# Patient Record
Sex: Female | Born: 1945
Health system: Southern US, Community
[De-identification: ages and names within clinical notes are randomized; demographics above are authoritative.]

## PROBLEM LIST (undated history)

## (undated) DIAGNOSIS — I10 Essential (primary) hypertension: Secondary | ICD-10-CM

## (undated) DIAGNOSIS — F03A0 Unspecified dementia, mild, without behavioral disturbance, psychotic disturbance, mood disturbance, and anxiety: Secondary | ICD-10-CM

## (undated) DIAGNOSIS — F0390 Unspecified dementia without behavioral disturbance: Secondary | ICD-10-CM

## (undated) DIAGNOSIS — T8859XA Other complications of anesthesia, initial encounter: Secondary | ICD-10-CM

## (undated) DIAGNOSIS — G4700 Insomnia, unspecified: Secondary | ICD-10-CM

## (undated) DIAGNOSIS — C801 Malignant (primary) neoplasm, unspecified: Secondary | ICD-10-CM

## (undated) DIAGNOSIS — T4145XA Adverse effect of unspecified anesthetic, initial encounter: Secondary | ICD-10-CM

## (undated) DIAGNOSIS — F329 Major depressive disorder, single episode, unspecified: Secondary | ICD-10-CM

## (undated) DIAGNOSIS — G43909 Migraine, unspecified, not intractable, without status migrainosus: Secondary | ICD-10-CM

## (undated) DIAGNOSIS — K2980 Duodenitis without bleeding: Secondary | ICD-10-CM

## (undated) DIAGNOSIS — K219 Gastro-esophageal reflux disease without esophagitis: Secondary | ICD-10-CM

## (undated) DIAGNOSIS — F32A Depression, unspecified: Secondary | ICD-10-CM

## (undated) HISTORY — DX: Insomnia, unspecified: G47.00

## (undated) HISTORY — DX: Depression, unspecified: F32.A

## (undated) HISTORY — DX: Major depressive disorder, single episode, unspecified: F32.9

## (undated) HISTORY — PX: ABDOMINAL HYSTERECTOMY: SHX81

## (undated) HISTORY — PX: SHOULDER SURGERY: SHX246

## (undated) HISTORY — PX: BACK SURGERY: SHX140

---

## 2011-02-21 HISTORY — PX: BRAIN SURGERY: SHX531

## 2017-05-02 ENCOUNTER — Encounter: Payer: Self-pay | Admitting: Cardiology

## 2017-06-14 ENCOUNTER — Ambulatory Visit (HOSPITAL_COMMUNITY)
Admission: EM | Admit: 2017-06-14 | Discharge: 2017-06-14 | Disposition: A | Discharge status: Home / Self Care | Payer: Medicare PPO | Source: Home / Self Care

## 2017-06-14 ENCOUNTER — Emergency Department (HOSPITAL_COMMUNITY)
Admission: EM | Admit: 2017-06-14 | Discharge: 2017-06-14 | Disposition: A | Discharge status: Home / Self Care | Payer: Medicare PPO | Attending: Emergency Medicine | Admitting: Emergency Medicine

## 2017-06-14 ENCOUNTER — Emergency Department (HOSPITAL_COMMUNITY): Payer: Medicare PPO

## 2017-06-14 ENCOUNTER — Other Ambulatory Visit: Payer: Self-pay

## 2017-06-14 ENCOUNTER — Encounter (HOSPITAL_COMMUNITY): Payer: Self-pay | Admitting: Emergency Medicine

## 2017-06-14 DIAGNOSIS — R1110 Vomiting, unspecified: Secondary | ICD-10-CM | POA: Diagnosis present

## 2017-06-14 DIAGNOSIS — K2980 Duodenitis without bleeding: Secondary | ICD-10-CM | POA: Insufficient documentation

## 2017-06-14 DIAGNOSIS — Z85828 Personal history of other malignant neoplasm of skin: Secondary | ICD-10-CM | POA: Insufficient documentation

## 2017-06-14 DIAGNOSIS — Z87891 Personal history of nicotine dependence: Secondary | ICD-10-CM | POA: Insufficient documentation

## 2017-06-14 HISTORY — DX: Malignant (primary) neoplasm, unspecified: C80.1

## 2017-06-14 LAB — COMPREHENSIVE METABOLIC PANEL
ALK PHOS: 50 U/L (ref 38–126)
ALT: 14 U/L (ref 14–54)
ANION GAP: 15 (ref 5–15)
AST: 18 U/L (ref 15–41)
Albumin: 3.7 g/dL (ref 3.5–5.0)
BILIRUBIN TOTAL: 0.9 mg/dL (ref 0.3–1.2)
BUN: 13 mg/dL (ref 6–20)
CALCIUM: 9.7 mg/dL (ref 8.9–10.3)
CO2: 25 mmol/L (ref 22–32)
CREATININE: 1.12 mg/dL — AB (ref 0.44–1.00)
Chloride: 100 mmol/L — ABNORMAL LOW (ref 101–111)
GFR, EST AFRICAN AMERICAN: 55 mL/min — AB (ref >60)
GFR, EST NON AFRICAN AMERICAN: 48 mL/min — AB (ref >60)
Glucose, Bld: 97 mg/dL (ref 65–99)
Potassium: 3.1 mmol/L — ABNORMAL LOW (ref 3.5–5.1)
SODIUM: 140 mmol/L (ref 135–145)
TOTAL PROTEIN: 6.5 g/dL (ref 6.5–8.1)

## 2017-06-14 LAB — URINALYSIS, ROUTINE W REFLEX MICROSCOPIC
Bilirubin Urine: NEGATIVE
Glucose, UA: NEGATIVE mg/dL
HGB URINE DIPSTICK: NEGATIVE
Ketones, ur: 20 mg/dL — AB
Leukocytes, UA: NEGATIVE
NITRITE: NEGATIVE
PROTEIN: NEGATIVE mg/dL
SPECIFIC GRAVITY, URINE: 1.029 (ref 1.005–1.030)
pH: 9 — ABNORMAL HIGH (ref 5.0–8.0)

## 2017-06-14 LAB — CBC
HCT: 37.3 % (ref 36.0–46.0)
Hemoglobin: 11.5 g/dL — ABNORMAL LOW (ref 12.0–15.0)
MCH: 23.6 pg — AB (ref 26.0–34.0)
MCHC: 30.8 g/dL (ref 30.0–36.0)
MCV: 76.4 fL — ABNORMAL LOW (ref 78.0–100.0)
PLATELETS: 546 10*3/uL — AB (ref 150–400)
RBC: 4.88 MIL/uL (ref 3.87–5.11)
RDW: 17.3 % — ABNORMAL HIGH (ref 11.5–15.5)
WBC: 16.6 10*3/uL — AB (ref 4.0–10.5)

## 2017-06-14 LAB — I-STAT CG4 LACTIC ACID, ED
Lactic Acid, Venous: 1.92 mmol/L — ABNORMAL HIGH (ref 0.5–1.9)
Lactic Acid, Venous: 1.9 mmol/L (ref 0.5–1.9)

## 2017-06-14 LAB — LIPASE, BLOOD: Lipase: 54 U/L — ABNORMAL HIGH (ref 11–51)

## 2017-06-14 LAB — I-STAT TROPONIN, ED: TROPONIN I, POC: 0 ng/mL (ref 0.00–0.08)

## 2017-06-14 MED ORDER — GI COCKTAIL ~~LOC~~
30.0000 mL | Freq: Once | ORAL | Status: AC
  Administered 2017-06-14: 30 mL via ORAL
  Filled 2017-06-14: qty 30

## 2017-06-14 MED ORDER — IOPAMIDOL (ISOVUE-300) INJECTION 61%
INTRAVENOUS | Status: AC
  Filled 2017-06-14: qty 100

## 2017-06-14 MED ORDER — CLARITHROMYCIN 500 MG PO TABS
500.0000 mg | ORAL_TABLET | Freq: Two times a day (BID) | ORAL | Status: DC
  Administered 2017-06-14: 500 mg via ORAL
  Filled 2017-06-14: qty 1

## 2017-06-14 MED ORDER — RANITIDINE HCL 150 MG/10ML PO SYRP
150.0000 mg | ORAL_SOLUTION | Freq: Once | ORAL | Status: AC
  Administered 2017-06-14: 150 mg via ORAL
  Filled 2017-06-14: qty 10

## 2017-06-14 MED ORDER — IOPAMIDOL (ISOVUE-300) INJECTION 61%
100.0000 mL | Freq: Once | INTRAVENOUS | Status: AC | PRN
  Administered 2017-06-14: 100 mL via INTRAVENOUS

## 2017-06-14 MED ORDER — PANTOPRAZOLE SODIUM 20 MG PO TBEC: 20.0000 mg | DELAYED_RELEASE_TABLET | Freq: Two times a day (BID) | ORAL | 0 refills | Status: DC

## 2017-06-14 MED ORDER — AMOXICILLIN 250 MG/5ML PO SUSR
500.0000 mg | Freq: Once | ORAL | Status: AC
  Administered 2017-06-14: 500 mg via ORAL
  Filled 2017-06-14 (×2): qty 10

## 2017-06-14 MED ORDER — FENTANYL CITRATE (PF) 100 MCG/2ML IJ SOLN
50.0000 ug | Freq: Once | INTRAMUSCULAR | Status: AC
  Administered 2017-06-14: 50 ug via INTRAVENOUS
  Filled 2017-06-14: qty 2

## 2017-06-14 MED ORDER — ONDANSETRON HCL 4 MG/2ML IJ SOLN
4.0000 mg | Freq: Once | INTRAMUSCULAR | Status: AC
  Administered 2017-06-14: 4 mg via INTRAVENOUS
  Filled 2017-06-14: qty 2

## 2017-06-14 MED ORDER — CLARITHROMYCIN 500 MG PO TABS: 500.0000 mg | ORAL_TABLET | Freq: Two times a day (BID) | ORAL | 0 refills | Status: DC

## 2017-06-14 MED ORDER — ONDANSETRON 4 MG PO TBDP
4.0000 mg | ORAL_TABLET | Freq: Once | ORAL | Status: AC | PRN
  Administered 2017-06-14: 4 mg via ORAL
  Filled 2017-06-14: qty 1

## 2017-06-14 MED ORDER — AMOXICILLIN 500 MG PO CAPS: 1000.0000 mg | ORAL_CAPSULE | Freq: Two times a day (BID) | ORAL | 0 refills | Status: DC

## 2017-06-14 MED ORDER — PANTOPRAZOLE SODIUM 40 MG PO PACK
40.0000 mg | PACK | Freq: Every day | ORAL | Status: DC
  Filled 2017-06-14 (×2): qty 20

## 2017-06-14 MED ORDER — ONDANSETRON HCL 4 MG PO TABS: 4.0000 mg | ORAL_TABLET | Freq: Three times a day (TID) | ORAL | 0 refills | Status: DC | PRN

## 2017-06-14 MED ORDER — LACTATED RINGERS IV BOLUS
1000.0000 mL | Freq: Once | INTRAVENOUS | Status: AC
  Administered 2017-06-14: 1000 mL via INTRAVENOUS

## 2017-06-14 NOTE — ED Notes (Signed)
Patient transported to CT 

## 2017-06-14 NOTE — ED Provider Notes (Signed)
Emergency Department Provider Note   I have reviewed the triage vital signs and the nursing notes.   HISTORY  Chief Complaint Emesis   HPI Jamie George is a 72 y.o. female without a known past medical history the presents the emergency department today with 4 days of vomiting.  Patient is a coming by her daughter who helps supplement the history sounds like she had some episodes started on Tuesday but Wednesday she was feeling well enough still to go get her hair done but was not eaten much that day.  Today seem to be worse and so she brought her here.  Patient just complains of upper abdominal pain that she relates to being similar to reflux in the past.  States it does not radiate anywhere, no diarrhea or fevers.  Not had anything like this before.  No recent infections. No other associated or modifying symptoms.    Past Medical History:  Diagnosis Date  . Cancer ([place])    skin    There are no active problems to display for this patient.   Past Surgical History:  Procedure Laterality Date  . ABDOMINAL HYSTERECTOMY    . BACK SURGERY    . BRAIN SURGERY    . SHOULDER SURGERY        Allergies Patient has no known allergies.  No family history on file.  Social History Social History   Tobacco Use  . Smoking status: Former Research scientist (life sciences)  . Smokeless tobacco: Former Network engineer Use Topics  . Alcohol use: Not Currently  . Drug use: Not Currently    Review of Systems  All other systems negative except as documented in the HPI. All pertinent positives and negatives as reviewed in the HPI. ____________________________________________   PHYSICAL EXAM:  VITAL SIGNS: ED Triage Vitals  Enc Vitals Group     BP 06/14/17 1130 (!) 161/98     Pulse Rate 06/14/17 1130 94     Resp 06/14/17 1130 (!) 22     Temp 06/14/17 1130 98.2 F (36.8 C)     Temp Source 06/14/17 1130 Oral     SpO2 06/14/17 1130 98 %     Weight 06/14/17 1131 100 lb (45.4 kg)     Height  06/14/17 1131 5\' 7"  (1.702 m)    Constitutional: Alert and oriented. Well appearing and in no acute distress. Eyes: Conjunctivae are normal. PERRL. EOMI. Head: Atraumatic. Nose: No congestion/rhinnorhea. Mouth/Throat: Mucous membranes are dry.  Oropharynx non-erythematous. Neck: No stridor.  No meningeal signs.   Cardiovascular: Normal rate, regular rhythm. Good peripheral circulation. Grossly normal heart sounds.   Respiratory: Normal respiratory effort.  No retractions. Lungs CTAB. Gastrointestinal: Soft and ttp in epigastric area with guarding. No distention.  Musculoskeletal: No lower extremity tenderness nor edema. No gross deformities of extremities. Neurologic:  Normal speech and language. No gross focal neurologic deficits are appreciated.  Skin:  Skin is warm, dry and intact. No rash noted.   ____________________________________________   LABS (all labs ordered are listed, but only abnormal results are displayed)  Labs Reviewed  LIPASE, BLOOD - Abnormal; Notable for the following components:      Result Value   Lipase 54 (*)    All other components within normal limits  COMPREHENSIVE METABOLIC PANEL - Abnormal; Notable for the following components:   Potassium 3.1 (*)    Chloride 100 (*)    Creatinine, Ser 1.12 (*)    GFR calc non Af Amer 48 (*)    GFR  calc Af Amer 55 (*)    All other components within normal limits  CBC - Abnormal; Notable for the following components:   WBC 16.6 (*)    Hemoglobin 11.5 (*)    MCV 76.4 (*)    MCH 23.6 (*)    RDW 17.3 (*)    Platelets 546 (*)    All other components within normal limits  URINALYSIS, ROUTINE W REFLEX MICROSCOPIC - Abnormal; Notable for the following components:   APPearance HAZY (*)    pH 9.0 (*)    Ketones, ur 20 (*)    All other components within normal limits  I-STAT CG4 LACTIC ACID, ED - Abnormal; Notable for the following components:   Lactic Acid, Venous 1.92 (*)    All other components within normal  limits  I-STAT TROPONIN, ED  I-STAT CG4 LACTIC ACID, ED   ____________________________________________  EKG   EKG Interpretation  Date/Time:  Thursday June 14 2017 11:48:56 EDT Ventricular Rate:  103 PR Interval:  92 QRS Duration: 68 QT Interval:  358 QTC Calculation: 468 R Axis:   84 Text Interpretation:  Sinus tachycardia with short PR Nonspecific ST and T wave abnormality Abnormal ECG No significant change since last tracing Confirmed by Merrily Pew [phone]) on 06/14/2017 4:19:53 PM      ____________________________________________  RADIOLOGY  Ct Abdomen Pelvis W Contrast  Result Date: 06/14/2017 CLINICAL DATA:  Pt c/o V/D x 3-4 days. 100 ml isovue 300 iv^140mL ISOVUE-300 IOPAMIDOL (ISOVUE-300) INJECTION 61% EXAM: CT ABDOMEN AND PELVIS WITH CONTRAST TECHNIQUE: Multidetector CT imaging of the abdomen and pelvis was performed using the standard protocol following bolus administration of intravenous contrast. CONTRAST:  172mL ISOVUE-300 IOPAMIDOL (ISOVUE-300) INJECTION 61% COMPARISON:  None. FINDINGS: Lower chest: There is minimal scarring in the RIGHT middle lobe and lingula. RIGHT basilar atelectasis. No pleural effusions. Heart size is normal. No pericardial effusion or significant coronary artery calcifications. Hepatobiliary: There is focal fatty infiltration adjacent to the falciform ligament. Small hepatic cysts are present. No suspicious liver lesions. Gallbladder is present. Pancreas: Within the pancreatic head, there is a calcification measuring 7 millimeters in diameter, likely within a side branch of the pancreatic duct. The main pancreatic duct and distal common bile duct appear normal. No pancreatic mass. Spleen: Normal in size without focal abnormality. Adrenals/Urinary Tract: Adrenal glands are unremarkable. Kidneys are normal, without renal calculi, focal lesion, or hydronephrosis. Bladder is unremarkable. Stomach/Bowel: The stomach is normal in appearance. The duodenal  wall is thickened and there is significant mucosal enhancement of the duodenal. Duodenal wall measures 8 millimeters. There is no bowel obstruction. Surgical clips are seen in the RIGHT LOWER QUADRANT. There is moderate stool burden throughout the colon. Vascular/Lymphatic: There is moderate atherosclerosis of the abdominal aorta, not associated with aneurysm. No retroperitoneal or mesenteric adenopathy. Reproductive: Uterus is surgically absent.  No adnexal mass. Other: No free pelvic fluid. Anterior abdominal wall is unremarkable. Musculoskeletal: Degenerative changes are seen in the lumbar spine. Convex LEFT scoliosis of the lumbar spine. No suspicious lytic or blastic lesions are identified. IMPRESSION: 1. Marked thickening of the duodenal wall, consistent with duodenitis. 2. Pancreatic head calcification likely within a side branch. No suspicious pancreatic mass or evidence for acute pancreatitis. 3. Moderate stool burden. 4. Appendectomy. 5. Hysterectomy. 6.  Aortic atherosclerosis.  (ICD10-I70.0) 7. Scoliosis and degenerative changes in the lumbar spine. Electronically Signed   By: Nolon Nations M.D.   On: 06/14/2017 18:02    ____________________________________________   PROCEDURES  Procedure(s) performed:  Procedures   ____________________________________________   INITIAL IMPRESSION / ASSESSMENT AND PLAN / ED COURSE  Labs consistent with likely dehydration but also with elevated white blood cell count mildly elevated lipase we will get a CT scan as she does have tenderness with mild guarding in epigastric area.  Will evaluate for perforated ulcer versus pancreatitis versus gastritis.  Fluids, fentanyl and Zofran for symptoms.  CT scan shows evidence of duodenitis.  As patient states this feels like her normal heartburn I suspect that she is peptic ulcer disease and will treat with triple therapy.  Follow-up with GI if not improved in 3 to 4 days.  Stable vital signs in the emergency  room low suspicion for sepsis.     Pertinent labs & imaging results that were available during my care of the patient were reviewed by me and considered in my medical decision making (see chart for details).  ____________________________________________  FINAL CLINICAL IMPRESSION(S) / ED DIAGNOSES  Final diagnoses:  Duodenitis     MEDICATIONS GIVEN DURING THIS VISIT:  Medications  lactated ringers bolus 1,000 mL (1,000 mLs Intravenous New Bag/Given 06/14/17 1638)  iopamidol (ISOVUE-300) 61 % injection (has no administration in time range)  clarithromycin (BIAXIN) tablet 500 mg (has no administration in time range)  pantoprazole sodium (PROTONIX) 40 mg/20 mL oral suspension 40 mg (has no administration in time range)  amoxicillin (AMOXIL) 250 MG/5ML suspension 500 mg (has no administration in time range)  ondansetron (ZOFRAN-ODT) disintegrating tablet 4 mg (4 mg Oral Given 06/14/17 1142)  gi cocktail (Maalox,Lidocaine,Donnatal) (30 mLs Oral Given 06/14/17 1636)  ranitidine (ZANTAC) 150 MG/10ML syrup 150 mg (150 mg Oral Given 06/14/17 1636)  ondansetron (ZOFRAN) injection 4 mg (4 mg Intravenous Given 06/14/17 1636)  fentaNYL (SUBLIMAZE) injection 50 mcg (50 mcg Intravenous Given 06/14/17 1652)  iopamidol (ISOVUE-300) 61 % injection 100 mL (100 mLs Intravenous Contrast Given 06/14/17 1715)     NEW OUTPATIENT MEDICATIONS STARTED DURING THIS VISIT:  New Prescriptions   AMOXICILLIN (AMOXIL) 500 MG CAPSULE    Take 2 capsules (1,000 mg total) by mouth 2 (two) times daily.   CLARITHROMYCIN (BIAXIN) 500 MG TABLET    Take 1 tablet (500 mg total) by mouth 2 (two) times daily.   ONDANSETRON (ZOFRAN) 4 MG TABLET    Take 1 tablet (4 mg total) by mouth every 8 (eight) hours as needed for nausea or vomiting.   PANTOPRAZOLE (PROTONIX) 20 MG TABLET    Take 1 tablet (20 mg total) by mouth 2 (two) times daily.    Note:  This note was prepared with assistance of Dragon voice recognition software.  Occasional wrong-word or sound-a-like substitutions may have occurred due to the inherent limitations of voice recognition software.   Merrily Pew, MD 06/14/17 [phone]

## 2017-06-14 NOTE — ED Triage Notes (Signed)
Per family reports emesis x 4 days, pt c/o upper abd pain, HA, tremors. Pt lips cracked. Moaning.

## 2017-06-14 NOTE — ED Notes (Signed)
Pt verbalized understanding of discharge paperwork and prescriptions.  °

## 2017-06-25 DIAGNOSIS — Z85828 Personal history of other malignant neoplasm of skin: Secondary | ICD-10-CM | POA: Diagnosis not present

## 2017-06-25 DIAGNOSIS — C4401 Basal cell carcinoma of skin of lip: Secondary | ICD-10-CM | POA: Diagnosis not present

## 2017-07-05 ENCOUNTER — Encounter: Payer: Self-pay | Admitting: *Deleted

## 2017-07-06 ENCOUNTER — Ambulatory Visit: Payer: Medicare PPO | Admitting: Diagnostic Neuroimaging

## 2017-07-06 ENCOUNTER — Encounter: Payer: Self-pay | Admitting: Diagnostic Neuroimaging

## 2017-07-06 VITALS — BP 130/72 | HR 83 | Ht 67.0 in | Wt 107.2 lb

## 2017-07-06 DIAGNOSIS — R413 Other amnesia: Secondary | ICD-10-CM | POA: Diagnosis not present

## 2017-07-06 NOTE — Progress Notes (Signed)
[facility]  PATIENT: Jamie George DOB: [birth year]  REFERRING CLINICIAN: Wilhemena Durie, PA HISTORY FROM: patient and daughter  REASON FOR VISIT: new consult    HISTORICAL  CHIEF COMPLAINT:  Chief Complaint  Patient presents with  . NP Marda Stalker PA  . Memory Loss    Rm 7, Daughter, [name].  Memory Loss, normal aging?    HISTORY OF PRESENT ILLNESS:   72 year old female here for evaluation of memory loss.  I asked patient why she was referred here today, and she told me "my daughter asked me to come here".  Patient realizes that daughter is concerned about mild memory loss.  Patient does not feel like she has any significant memory problems.  According to daughter patient was living in [place] with husband until 2015/03/30 when he passed away.  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  Patient developed some depression following this.  Patient was having more difficulty living at home, operating appliances such as TV, blender, stove, coffee maker.  Patient's daughter was visiting her from [place] on a weekly basis.  Patient had thrown away several appliances because she told her daughter they "did not work".  Patient was trying to miss appointments.  She was having increasing questions and repetitive conversations with daughter.  She was losing weight, eating less, and having other problems.  Therefore patient's daughter arranged for patient to move to [facility] in February 2019.  Patient continues to have some early morning confusion.  This prompted family to arrange 3 hours of home health aide care.  Patient is able to perform most of her activities of daily living such as dressing, toileting, eating and transferring.  She needs some help washing her hair but otherwise is able to bathe herself.  [place] Parker ADL score 5 out of 6.  Regarding  instrumental activities of daily living --> [place] instrumental ADL score 4 out of 8. She still able to use a telephone, do light housework, do simple financial transactions.  However she needs help with shopping, laundry, transportation, medications, food preparation.     REVIEW OF SYSTEMS: Full 14 system review of systems performed and negative with exception of: Depression anxiety decreased appetite decrease activities memory loss confusion headache weight loss fatigue.  ALLERGIES: No Known Allergies  HOME MEDICATIONS: Outpatient Medications Prior to Visit  Medication Sig Dispense Refill  . aspirin-acetaminophen-caffeine (EXCEDRIN MIGRAINE) 250-250-65 MG tablet Take 1 tablet by mouth every 6 (six) hours as needed for headache.    . bismuth subsalicylate (PEPTO BISMOL) 262 MG/15ML suspension Take 30 mLs by mouth every 6 (six) hours as needed for indigestion or diarrhea or loose stools.    Marland Kitchen escitalopram (LEXAPRO) 10 MG tablet Take 10 mg by mouth daily.  0  . MELATONIN-THEANINE PO Take 1-3 tablets by mouth at bedtime.    . ranitidine (ZANTAC) 150 MG tablet Take 150 mg by mouth 2 (two) times daily as needed for heartburn.    . traZODone (DESYREL) 50 MG tablet Take 50 mg by mouth at bedtime as needed for sleep.   0  . aspirin-acetaminophen-caffeine (EXCEDRIN MIGRAINE) 250-250-65 MG tablet Take 1 tablet by mouth every 6 (six) hours as needed for headache or migraine.    . ondansetron (ZOFRAN) 4 MG tablet Take 1 tablet (4 mg total) by mouth every 8 (eight) hours as needed for nausea or vomiting. (Patient not taking:  Reported on 07/06/2017) 12 tablet 0  . pantoprazole (PROTONIX) 20 MG tablet Take 1 tablet (20 mg total) by mouth 2 (two) times daily. (Patient not taking: Reported on 07/06/2017) 30 tablet 0   No facility-administered medications prior to visit.     PAST MEDICAL HISTORY: Past Medical History:  Diagnosis Date  . Cancer ([place])    skin  . Depression   . Insomnia     PAST SURGICAL  HISTORY: Past Surgical History:  Procedure Laterality Date  . ABDOMINAL HYSTERECTOMY    . BACK SURGERY    . BRAIN SURGERY  2013   meningioma  . SHOULDER SURGERY      FAMILY HISTORY: Family History  Problem Relation Age of Onset  . Cancer Mother   . Stroke Father   . Heart disease Father     SOCIAL HISTORY:  Social History   Socioeconomic History  . Marital status: Widowed    Spouse name: Not on file  . Number of children: Not on file  . Years of education: Not on file  . Highest education level: Not on file  Occupational History  . Not on file  Social Needs  . Financial resource strain: Not on file  . Food insecurity:    Worry: Not on file    Inability: Not on file  . Transportation needs:    Medical: Not on file    Non-medical: Not on file  Tobacco Use  . Smoking status: Former Research scientist (life sciences)  . Smokeless tobacco: Former Network engineer and Sexual Activity  . Alcohol use: Not Currently  . Drug use: Never  . Sexual activity: Not on file  Lifestyle  . Physical activity:    Days per week: Not on file    Minutes per session: Not on file  . Stress: Not on file  Relationships  . Social connections:    Talks on phone: Not on file    Gets together: Not on file    Attends religious service: Not on file    Active member of club or organization: Not on file    Attends meetings of clubs or organizations: Not on file    Relationship status: Not on file  . Intimate partner violence:    Fear of current or ex partner: Not on file    Emotionally abused: Not on file    Physically abused: Not on file    Forced sexual activity: Not on file  Other Topics Concern  . Not on file  Social History Narrative   Lives alone in home.  Retired.  Widowed.  Children 2 (daughter, [name]).  Caffeine some.      PHYSICAL EXAM  GENERAL EXAM/CONSTITUTIONAL: Vitals:  Vitals:   07/06/17 0809  BP: 130/72  Pulse: 83  Weight: 107 lb 3.2 oz (48.6 kg)  Height: 5\' 7"  (1.702 m)     Body  mass index is 16.79 kg/m.  Visual Acuity Screening   Right eye Left eye Both eyes  Without correction:     With correction: 20/50 20/30      Patient is in no distress; well developed, nourished and groomed; neck is supple  LEFT UPPER LIP STERISTRIP / SWELLING (RECENT MOHS SURGERY)  CARDIOVASCULAR:  Examination of carotid arteries is normal; no carotid bruits  Regular rate and rhythm, no murmurs  Examination of peripheral vascular system by observation and palpation is normal  EYES:  Ophthalmoscopic exam of optic discs and posterior segments is normal; no papilledema or hemorrhages  MUSCULOSKELETAL:  Gait, strength, tone, movements noted in Neurologic exam below  NEUROLOGIC: MENTAL STATUS:  MMSE - Mini Mental State Exam 07/06/2017  Orientation to time 4  Orientation to Place 3  Registration 3  Attention/ Calculation 4  Recall 2  Language- name 2 objects 2  Language- repeat 1  Language- follow 3 step command 2  Language- read & follow direction 1  Write a sentence 1  Copy design 1  Total score 24    awake, alert, oriented to person, place and time  [place] attention and concentration  language fluent, comprehension intact, naming intact,   fund of knowledge appropriate  DECR INSIGHT   CRANIAL NERVE:   2nd - no papilledema on fundoscopic exam  2nd, 3rd, 4th, 6th - pupils equal and reactive to light, visual fields full to confrontation, extraocular muscles intact, no nystagmus  5th - facial sensation symmetric  7th - facial strength symmetric  8th - hearing intact  9th - palate elevates symmetrically, uvula midline  11th - shoulder shrug symmetric  12th - tongue protrusion midline  MOTOR:   normal bulk and tone, full strength in the BUE, BLE  SENSORY:   normal and symmetric to light touch, temperature, vibration  COORDINATION:   finger-nose-finger, fine finger movements normal  REFLEXES:   deep tendon reflexes present and  symmetric  GAIT/STATION:   narrow based gait; able to walk tandem; romberg is negative    DIAGNOSTIC DATA (LABS, IMAGING, TESTING) - I reviewed patient records, labs, notes, testing and imaging myself where available.  Lab Results  Component Value Date   WBC 16.6 (H) 06/14/2017   HGB 11.5 (L) 06/14/2017   HCT 37.3 06/14/2017   MCV 76.4 (L) 06/14/2017   PLT 546 (H) 06/14/2017      Component Value Date/Time   NA 140 06/14/2017 1133   K 3.1 (L) 06/14/2017 1133   CL 100 (L) 06/14/2017 1133   CO2 25 06/14/2017 1133   GLUCOSE 97 06/14/2017 1133   BUN 13 06/14/2017 1133   CREATININE 1.12 (H) 06/14/2017 1133   CALCIUM 9.7 06/14/2017 1133   PROT 6.5 06/14/2017 1133   ALBUMIN 3.7 06/14/2017 1133   AST 18 06/14/2017 1133   ALT 14 06/14/2017 1133   ALKPHOS 50 06/14/2017 1133   BILITOT 0.9 06/14/2017 1133   GFRNONAA 48 (L) 06/14/2017 1133   GFRAA 55 (L) 06/14/2017 1133   No results found for: CHOL, HDL, LDLCALC, LDLDIRECT, TRIG, CHOLHDL No results found for: HGBA1C No results found for: VITAMINB12 No results found for: TSH  B12 503  TSH 0.88    ASSESSMENT AND PLAN  72 y.o. year old female here with gradual onset progressive short-term memory loss, confusion, decline in activities of daily living, MMSE 24 out of 30, decreased insight, depression, most consistent with neurodegenerative dementia.  We will proceed with further work-up.   Dx: memory loss (mild dementia vs other secondary cause)  1. Memory loss      PLAN:  - check MRI brain - consider memantine - consider research studies - caregiver resources reviewed - increase safety / supervision - brain health activities reviewed  Orders Placed This Encounter  Procedures  . MR BRAIN W WO CONTRAST   Return in about 6 months (around 01/06/2018).    Penni Bombard, MD 2/42/3536, 1:44 AM Certified in Neurology, Neurophysiology and Neuroimaging  [facility] [address] [place], [place] [address] [phone]

## 2017-07-06 NOTE — Patient Instructions (Signed)
Thank you for coming to see [place] at [facility]. I hope we have been able to provide you high quality care today.  You may receive a patient satisfaction survey over the next few weeks. We would appreciate your feedback and comments so that we may continue to improve ourselves and the health of our patients.  - check MRI brain  - visit [email or web address] for more information   ~~~~~~~~~~~~~~~~~~~~~~~~~~~~~~~~~~~~~~~~~~~~~~~~~~~~~~~~~~~~~~~~~  DR. Allice Garro'S GUIDE TO HAPPY AND HEALTHY LIVING These are some of my general health and wellness recommendations. Some of them may apply to you better than others. Please use common sense as you try these suggestions and feel free to ask me any questions.   ACTIVITY/FITNESS Mental, social, emotional and physical stimulation are very important for brain and body health. Try learning a new activity (arts, music, language, sports, games).  Keep moving your body to the best of your abilities. You can do this at home, inside or outside, the park, community center, gym or anywhere you like. Consider a physical therapist or personal trainer to get started. Fitness trackers, smart-watches or  smart-phones can help as well.   NUTRITION Eat more plants: colorful vegetables, nuts, seeds and berries.  Eat less sugar, salt, preservatives and processed foods.  Avoid toxins such as cigarettes and alcohol.  Drink water when you are thirsty. Warm water with a slice of lemon is an excellent morning drink to start the day.  Consider these websites for more information The Nutrition Source (https://www.henry-hernandez.biz/) Precision Nutrition (WindowBlog.ch)   RELAXATION Consider practicing mindfulness meditation or other relaxation techniques such as deep breathing, prayer, yoga, tai chi, massage. See website mindful.org or the apps Headspace or Calm to help get started.   SLEEP Try to get at least 7-8+  hours sleep per day. Regular exercise and reduced caffeine will help you sleep better. Practice good sleep hygeine techniques. See website sleep.org for more information.   PLANNING Prepare estate planning, living will, healthcare POA documents. Sometimes this is best planned with the help of an attorney. Theconversationproject.org and agingwithdignity.org are excellent resources.

## 2017-07-09 ENCOUNTER — Telehealth: Payer: Self-pay | Admitting: Diagnostic Neuroimaging

## 2017-07-09 DIAGNOSIS — Z0289 Encounter for other administrative examinations: Secondary | ICD-10-CM

## 2017-07-09 NOTE — Telephone Encounter (Signed)
[employer] pending faxed clinical notes. Tracking # [id number].

## 2017-07-10 NOTE — Telephone Encounter (Signed)
Jamie George Josem Kaufmann: [id number] (exp. 07/09/17 to 08/08/17) order sent to GI. They will reach out to the pt to schedule.

## 2017-07-11 ENCOUNTER — Telehealth: Payer: Self-pay | Admitting: *Deleted

## 2017-07-11 NOTE — Telephone Encounter (Signed)
FMLA completed, to Dr. Leta Baptist for review and signature.

## 2017-07-12 NOTE — Telephone Encounter (Signed)
Form signed and to MR.

## 2017-07-17 ENCOUNTER — Telehealth: Payer: Self-pay | Admitting: *Deleted

## 2017-07-17 NOTE — Telephone Encounter (Signed)
Pt Flma form faxed to Domenic Polite [phone]

## 2017-07-19 ENCOUNTER — Telehealth: Payer: Self-pay | Admitting: *Deleted

## 2017-07-19 NOTE — Telephone Encounter (Signed)
Pt flma form faxed to [phone]

## 2017-08-05 ENCOUNTER — Ambulatory Visit
Admission: RE | Admit: 2017-08-05 | Discharge: 2017-08-05 | Disposition: A | Discharge status: Home / Self Care | Payer: Medicare PPO | Source: Ambulatory Visit | Attending: Diagnostic Neuroimaging | Admitting: Diagnostic Neuroimaging

## 2017-08-05 DIAGNOSIS — R413 Other amnesia: Secondary | ICD-10-CM

## 2017-08-05 MED ORDER — GADOBENATE DIMEGLUMINE 529 MG/ML IV SOLN
10.0000 mL | Freq: Once | INTRAVENOUS | Status: AC | PRN
  Administered 2017-08-05: 10 mL via INTRAVENOUS

## 2017-08-10 ENCOUNTER — Telehealth: Payer: Self-pay | Admitting: Neurology

## 2017-08-10 ENCOUNTER — Encounter: Payer: Self-pay | Admitting: Diagnostic Neuroimaging

## 2017-08-10 NOTE — Telephone Encounter (Signed)
-----   Message from Penni Bombard, MD sent at 08/10/2017 12:24 PM EDT ----- Mild atrophy and chronic small vessel ischemic disease. No other major findings. Please call patient. Continue current plan. -VRP

## 2017-08-10 NOTE — Telephone Encounter (Signed)
Called to review MRI results. No answer. LVM for the pt to call back.

## 2017-08-10 NOTE — Telephone Encounter (Signed)
Called the patient's daughter and reviewed the MRI findings with her. Pt verbalized understanding. Patients daughter questioned if Dr Tish Frederickson would like to start treatment with namenda as mentioned in the office visit. I informed her that I would sent her request to Dr Tish Frederickson. The pharmacy on file is where she would like to have that sent if he chooses to start it. Pt verbalized understanding and was appreciative for the call.

## 2017-08-13 ENCOUNTER — Emergency Department (HOSPITAL_COMMUNITY): Payer: Medicare PPO

## 2017-08-13 ENCOUNTER — Observation Stay (HOSPITAL_BASED_OUTPATIENT_CLINIC_OR_DEPARTMENT_OTHER): Payer: Medicare PPO

## 2017-08-13 ENCOUNTER — Observation Stay (HOSPITAL_COMMUNITY)
Admission: EM | Admit: 2017-08-13 | Discharge: 2017-08-14 | Disposition: A | Discharge status: Home / Self Care | Payer: Medicare PPO | Attending: Family Medicine | Admitting: Family Medicine

## 2017-08-13 ENCOUNTER — Encounter (HOSPITAL_COMMUNITY): Payer: Self-pay | Admitting: Emergency Medicine

## 2017-08-13 ENCOUNTER — Other Ambulatory Visit: Payer: Self-pay

## 2017-08-13 DIAGNOSIS — Z85828 Personal history of other malignant neoplasm of skin: Secondary | ICD-10-CM | POA: Diagnosis not present

## 2017-08-13 DIAGNOSIS — F419 Anxiety disorder, unspecified: Secondary | ICD-10-CM | POA: Insufficient documentation

## 2017-08-13 DIAGNOSIS — F0390 Unspecified dementia without behavioral disturbance: Secondary | ICD-10-CM | POA: Insufficient documentation

## 2017-08-13 DIAGNOSIS — Z87891 Personal history of nicotine dependence: Secondary | ICD-10-CM | POA: Diagnosis not present

## 2017-08-13 DIAGNOSIS — E785 Hyperlipidemia, unspecified: Secondary | ICD-10-CM | POA: Diagnosis not present

## 2017-08-13 DIAGNOSIS — R0789 Other chest pain: Principal | ICD-10-CM | POA: Insufficient documentation

## 2017-08-13 DIAGNOSIS — I7100 Dissection of unspecified site of aorta: Secondary | ICD-10-CM | POA: Diagnosis not present

## 2017-08-13 DIAGNOSIS — E041 Nontoxic single thyroid nodule: Secondary | ICD-10-CM | POA: Diagnosis not present

## 2017-08-13 DIAGNOSIS — I340 Nonrheumatic mitral (valve) insufficiency: Secondary | ICD-10-CM

## 2017-08-13 DIAGNOSIS — I7102 Dissection of abdominal aorta: Secondary | ICD-10-CM | POA: Diagnosis not present

## 2017-08-13 DIAGNOSIS — R011 Cardiac murmur, unspecified: Secondary | ICD-10-CM | POA: Diagnosis not present

## 2017-08-13 DIAGNOSIS — F329 Major depressive disorder, single episode, unspecified: Secondary | ICD-10-CM | POA: Diagnosis not present

## 2017-08-13 DIAGNOSIS — I10 Essential (primary) hypertension: Secondary | ICD-10-CM | POA: Diagnosis not present

## 2017-08-13 DIAGNOSIS — R079 Chest pain, unspecified: Secondary | ICD-10-CM | POA: Diagnosis not present

## 2017-08-13 DIAGNOSIS — Z79899 Other long term (current) drug therapy: Secondary | ICD-10-CM | POA: Diagnosis not present

## 2017-08-13 DIAGNOSIS — N189 Chronic kidney disease, unspecified: Secondary | ICD-10-CM

## 2017-08-13 DIAGNOSIS — I700 Atherosclerosis of aorta: Secondary | ICD-10-CM | POA: Insufficient documentation

## 2017-08-13 DIAGNOSIS — D649 Anemia, unspecified: Secondary | ICD-10-CM

## 2017-08-13 DIAGNOSIS — N186 End stage renal disease: Secondary | ICD-10-CM | POA: Insufficient documentation

## 2017-08-13 DIAGNOSIS — N1832 Chronic kidney disease, stage 3b: Secondary | ICD-10-CM

## 2017-08-13 DIAGNOSIS — K219 Gastro-esophageal reflux disease without esophagitis: Secondary | ICD-10-CM | POA: Diagnosis not present

## 2017-08-13 DIAGNOSIS — G4700 Insomnia, unspecified: Secondary | ICD-10-CM | POA: Diagnosis not present

## 2017-08-13 DIAGNOSIS — Z7982 Long term (current) use of aspirin: Secondary | ICD-10-CM | POA: Insufficient documentation

## 2017-08-13 DIAGNOSIS — D509 Iron deficiency anemia, unspecified: Secondary | ICD-10-CM | POA: Insufficient documentation

## 2017-08-13 DIAGNOSIS — I120 Hypertensive chronic kidney disease with stage 5 chronic kidney disease or end stage renal disease: Secondary | ICD-10-CM | POA: Diagnosis not present

## 2017-08-13 HISTORY — DX: Unspecified dementia without behavioral disturbance: F03.90

## 2017-08-13 HISTORY — DX: Duodenitis without bleeding: K29.80

## 2017-08-13 HISTORY — DX: Unspecified dementia, mild, without behavioral disturbance, psychotic disturbance, mood disturbance, and anxiety: F03.A0

## 2017-08-13 HISTORY — DX: Migraine, unspecified, not intractable, without status migrainosus: G43.909

## 2017-08-13 HISTORY — DX: Gastro-esophageal reflux disease without esophagitis: K21.9

## 2017-08-13 HISTORY — DX: Adverse effect of unspecified anesthetic, initial encounter: T41.45XA

## 2017-08-13 HISTORY — DX: Other complications of anesthesia, initial encounter: T88.59XA

## 2017-08-13 LAB — CBC
HEMATOCRIT: 33.3 % — AB (ref 36.0–46.0)
Hemoglobin: 9.4 g/dL — ABNORMAL LOW (ref 12.0–15.0)
MCH: 19.9 pg — AB (ref 26.0–34.0)
MCHC: 28.2 g/dL — ABNORMAL LOW (ref 30.0–36.0)
MCV: 70.6 fL — AB (ref 78.0–100.0)
Platelets: 458 10*3/uL — ABNORMAL HIGH (ref 150–400)
RBC: 4.72 MIL/uL (ref 3.87–5.11)
RDW: 19.6 % — ABNORMAL HIGH (ref 11.5–15.5)
WBC: 10.1 10*3/uL (ref 4.0–10.5)

## 2017-08-13 LAB — FERRITIN: Ferritin: 7 ng/mL — ABNORMAL LOW (ref 11–307)

## 2017-08-13 LAB — HEPATIC FUNCTION PANEL
ALK PHOS: 49 U/L (ref 38–126)
ALT: 19 U/L (ref 14–54)
AST: 22 U/L (ref 15–41)
Albumin: 3.7 g/dL (ref 3.5–5.0)
BILIRUBIN TOTAL: 0.7 mg/dL (ref 0.3–1.2)
Bilirubin, Direct: 0.1 mg/dL (ref 0.1–0.5)
Indirect Bilirubin: 0.6 mg/dL (ref 0.3–0.9)
Total Protein: 6 g/dL — ABNORMAL LOW (ref 6.5–8.1)

## 2017-08-13 LAB — BASIC METABOLIC PANEL
Anion gap: 11 (ref 5–15)
BUN: 14 mg/dL (ref 6–20)
CHLORIDE: 106 mmol/L (ref 101–111)
CO2: 22 mmol/L (ref 22–32)
Calcium: 9.4 mg/dL (ref 8.9–10.3)
Creatinine, Ser: 1.17 mg/dL — ABNORMAL HIGH (ref 0.44–1.00)
GFR calc Af Amer: 52 mL/min — ABNORMAL LOW (ref >60)
GFR calc non Af Amer: 45 mL/min — ABNORMAL LOW (ref >60)
GLUCOSE: 88 mg/dL (ref 65–99)
POTASSIUM: 3.7 mmol/L (ref 3.5–5.1)
SODIUM: 139 mmol/L (ref 135–145)

## 2017-08-13 LAB — I-STAT TROPONIN, ED: Troponin i, poc: 0 ng/mL (ref 0.00–0.08)

## 2017-08-13 LAB — TSH: TSH: 1.627 u[IU]/mL (ref 0.350–4.500)

## 2017-08-13 LAB — ECHOCARDIOGRAM COMPLETE
HEIGHTINCHES: 67 in
WEIGHTICAEL: 1712 [oz_av]

## 2017-08-13 LAB — RETICULOCYTES
RBC.: 4.68 MIL/uL (ref 3.87–5.11)
RETIC COUNT ABSOLUTE: 79.6 10*3/uL (ref 19.0–186.0)
Retic Ct Pct: 1.7 % (ref 0.4–3.1)

## 2017-08-13 LAB — LIPID PANEL
Cholesterol: 216 mg/dL — ABNORMAL HIGH (ref 0–200)
HDL: 59 mg/dL (ref >40)
LDL CALC: 137 mg/dL — AB (ref 0–99)
Total CHOL/HDL Ratio: 3.7 RATIO
Triglycerides: 102 mg/dL (ref <150)
VLDL: 20 mg/dL (ref 0–40)

## 2017-08-13 LAB — TYPE AND SCREEN
ABO/RH(D): O POS
Antibody Screen: NEGATIVE

## 2017-08-13 LAB — LIPASE, BLOOD: LIPASE: 64 U/L — AB (ref 11–51)

## 2017-08-13 LAB — IRON AND TIBC
IRON: 18 ug/dL — AB (ref 28–170)
Saturation Ratios: 4 % — ABNORMAL LOW (ref 10.4–31.8)
TIBC: 473 ug/dL — ABNORMAL HIGH (ref 250–450)
UIBC: 455 ug/dL

## 2017-08-13 LAB — T4, FREE: Free T4: 0.88 ng/dL (ref 0.82–1.77)

## 2017-08-13 LAB — VITAMIN B12: Vitamin B-12: 519 pg/mL (ref 180–914)

## 2017-08-13 LAB — FOLATE: FOLATE: 15.1 ng/mL (ref >5.9)

## 2017-08-13 MED ORDER — ATORVASTATIN CALCIUM 40 MG PO TABS: 40.0000 mg | ORAL_TABLET | Freq: Every day | ORAL | Status: DC

## 2017-08-13 MED ORDER — ALUM & MAG HYDROXIDE-SIMETH 200-200-20 MG/5ML PO SUSP
15.0000 mL | Freq: Once | ORAL | Status: AC
  Administered 2017-08-13: 15 mL via ORAL
  Filled 2017-08-13: qty 30

## 2017-08-13 MED ORDER — ONDANSETRON HCL 4 MG/2ML IJ SOLN: 4.0000 mg | Freq: Four times a day (QID) | INTRAMUSCULAR | Status: DC | PRN

## 2017-08-13 MED ORDER — MORPHINE SULFATE (PF) 4 MG/ML IV SOLN: 1.0000 mg | INTRAVENOUS | Status: DC | PRN

## 2017-08-13 MED ORDER — SENNOSIDES-DOCUSATE SODIUM 8.6-50 MG PO TABS: 1.0000 | ORAL_TABLET | Freq: Every evening | ORAL | Status: DC | PRN

## 2017-08-13 MED ORDER — IOPAMIDOL (ISOVUE-370) INJECTION 76%
INTRAVENOUS | Status: AC
  Filled 2017-08-13: qty 100

## 2017-08-13 MED ORDER — SODIUM CHLORIDE 0.9 % IV SOLN
INTRAVENOUS | Status: DC
Start: 2017-08-13 — End: 2017-08-14
  Administered 2017-08-13 – 2017-08-14 (×2): via INTRAVENOUS

## 2017-08-13 MED ORDER — FAMOTIDINE 20 MG PO TABS: 20.0000 mg | ORAL_TABLET | Freq: Every day | ORAL | Status: DC

## 2017-08-13 MED ORDER — HYDRALAZINE HCL 20 MG/ML IJ SOLN
5.0000 mg | Freq: Four times a day (QID) | INTRAMUSCULAR | Status: DC | PRN
  Administered 2017-08-14: 5 mg via INTRAVENOUS
  Filled 2017-08-13: qty 1

## 2017-08-13 MED ORDER — TRAZODONE HCL 50 MG PO TABS
50.0000 mg | ORAL_TABLET | Freq: Every day | ORAL | Status: DC
  Administered 2017-08-13: 50 mg via ORAL
  Filled 2017-08-13 (×2): qty 1

## 2017-08-13 MED ORDER — ASPIRIN EC 81 MG PO TBEC
81.0000 mg | DELAYED_RELEASE_TABLET | Freq: Every day | ORAL | Status: DC
  Administered 2017-08-14: 81 mg via ORAL
  Filled 2017-08-13: qty 1

## 2017-08-13 MED ORDER — ACETAMINOPHEN 650 MG RE SUPP: 650.0000 mg | Freq: Four times a day (QID) | RECTAL | Status: DC | PRN

## 2017-08-13 MED ORDER — ONDANSETRON HCL 4 MG PO TABS: 4.0000 mg | ORAL_TABLET | Freq: Four times a day (QID) | ORAL | Status: DC | PRN

## 2017-08-13 MED ORDER — ZOLPIDEM TARTRATE 5 MG PO TABS
5.0000 mg | ORAL_TABLET | Freq: Every evening | ORAL | Status: DC | PRN
  Administered 2017-08-13: 5 mg via ORAL
  Filled 2017-08-13: qty 1

## 2017-08-13 MED ORDER — LIDOCAINE 5 % EX PTCH
1.0000 | MEDICATED_PATCH | CUTANEOUS | Status: DC
  Administered 2017-08-13: 1 via TRANSDERMAL
  Filled 2017-08-13: qty 1

## 2017-08-13 MED ORDER — ACETAMINOPHEN 325 MG PO TABS
650.0000 mg | ORAL_TABLET | Freq: Four times a day (QID) | ORAL | Status: DC | PRN
  Administered 2017-08-14 (×2): 650 mg via ORAL
  Filled 2017-08-13 (×2): qty 2

## 2017-08-13 MED ORDER — IOPAMIDOL (ISOVUE-370) INJECTION 76%
100.0000 mL | Freq: Once | INTRAVENOUS | Status: AC
  Administered 2017-08-13: 100 mL via INTRAVENOUS

## 2017-08-13 MED ORDER — ESCITALOPRAM OXALATE 10 MG PO TABS
10.0000 mg | ORAL_TABLET | Freq: Every day | ORAL | Status: DC
  Administered 2017-08-13: 10 mg via ORAL
  Filled 2017-08-13: qty 1

## 2017-08-13 MED ORDER — BISACODYL 10 MG RE SUPP: 10.0000 mg | Freq: Every day | RECTAL | Status: DC | PRN

## 2017-08-13 MED ORDER — HYDROCODONE-ACETAMINOPHEN 5-325 MG PO TABS
1.0000 | ORAL_TABLET | ORAL | Status: DC | PRN
  Administered 2017-08-13 – 2017-08-14 (×2): 2 via ORAL
  Filled 2017-08-13 (×2): qty 2

## 2017-08-13 MED ORDER — ASPIRIN-ACETAMINOPHEN-CAFFEINE 250-250-65 MG PO TABS
1.0000 | ORAL_TABLET | Freq: Four times a day (QID) | ORAL | Status: DC | PRN
  Filled 2017-08-13: qty 1

## 2017-08-13 NOTE — CV Procedure (Signed)
Echocardiogram not completed because they wanted to move the patient to her room. Echocardiogram will be reattempted later.  Darlina Sicilian RDCS

## 2017-08-13 NOTE — Progress Notes (Signed)
  Echocardiogram 2D Echocardiogram has been performed.  Darlina Sicilian M 08/13/2017, 4:36 PM

## 2017-08-13 NOTE — Progress Notes (Signed)
Pt arrived from [facility] ED. Pt oriented to room and staff. Vitals obtained. Telemetry box applied and CCMD notified x2. CHG bath completed. Daughter at bedside. Will continue current plan of care.  Ara Kussmaul BSN, RN

## 2017-08-13 NOTE — ED Provider Notes (Signed)
[place] EMERGENCY DEPARTMENT Provider Note   CSN: [id number] Arrival date & time: 08/13/17  1102     History   Chief Complaint Chief Complaint  Patient presents with  . Chest Pain    HPI Jamie George is a 72 y.o. female.  HPI   Patient is a 72 year old female presenting with acute chest pain.  Patient has past medical history significant for migraines insomnia depression, mood disorder.  She lives at home with a home health aide.  According to patient's daughter patient was trying to turn on TV and got very frustrated that when at work, she became tearful crying out and then developed some left-sided chest pain.  On arrival patient was inconsolable.  Initially patient kept her eyes closed throughout the exam with her jaw clenched acting very oddly.  Throughout HPI, patient relaxed, was able to converse, and appeared more comfortable.  She reports she did not have any nausea vomiting sweating or arm pain jaw pain or other complaints until today after the TV wouldnt work.  Patient's daughter think this is reaction that was emotional in nature.  That being said patient expresses a 10 out of 10 pain in the left chest wall radiating to her back.  Past Medical History:  Diagnosis Date  . Cancer ([place])    skin  . Depression   . Duodenitis   . Insomnia   . Migraines   . Mild dementia     There are no active problems to display for this patient.   Past Surgical History:  Procedure Laterality Date  . ABDOMINAL HYSTERECTOMY    . BACK SURGERY    . BRAIN SURGERY  2013   meningioma  . SHOULDER SURGERY       OB History   None      Home Medications    Prior to Admission medications   Medication Sig Start Date End Date Taking? Authorizing Provider  aspirin-acetaminophen-caffeine (EXCEDRIN MIGRAINE) [phone] MG tablet Take 1 tablet by mouth every 6 (six) hours as needed for headache.   Yes [provider]  escitalopram (LEXAPRO) 10 MG  tablet Take 10 mg by mouth at bedtime.  05/03/17  Yes [provider]  MELATONIN-THEANINE PO Take 1-3 tablets by mouth at bedtime.   Yes [provider]  ranitidine (ZANTAC) 150 MG tablet Take 150 mg by mouth 2 (two) times daily as needed for heartburn.   Yes [provider]  traZODone (DESYREL) 50 MG tablet Take 50 mg by mouth at bedtime.  05/03/17  Yes [provider]    Family History Family History  Problem Relation Age of Onset  . Cancer Mother   . Stroke Father   . Heart disease Father     Social History Social History   Tobacco Use  . Smoking status: Former Research scientist (life sciences)  . Smokeless tobacco: Former Network engineer Use Topics  . Alcohol use: Not Currently  . Drug use: Never     Allergies   Patient has no known allergies.   Review of Systems Review of Systems  Constitutional: Negative for fatigue and fever.  Respiratory: Positive for chest tightness.   Cardiovascular: Positive for chest pain.  Gastrointestinal: Negative for abdominal pain, diarrhea and vomiting.  Neurological: Negative for dizziness and weakness.  All other systems reviewed and are negative.    Physical Exam Updated Vital Signs BP 125/69   Pulse 74   Temp 98.6 F (37 C) (Oral)   Resp 18  Ht 5\' 7"  (1.702 m)   Wt 48.5 kg (107 lb)   SpO2 100%   BMI 16.76 kg/m   Physical Exam  Constitutional: She is oriented to person, place, and time. She appears well-developed and well-nourished.  HENT:  Head: Normocephalic and atraumatic.  Eyes: Right eye exhibits no discharge.  Cardiovascular: Normal rate, regular rhythm, normal heart sounds, intact distal pulses and normal pulses.  No murmur heard. Pulmonary/Chest: Effort normal and breath sounds normal. She has no wheezes. She has no rales.  Exquisite tenderness to left chest wall.  No evidence of rash.  Patient clutching her left chest wall.  Abdominal: Soft. She exhibits no distension. There is no tenderness.    Neurological: She is oriented to person, place, and time.  Skin: Skin is warm and dry. She is not diaphoretic.  Psychiatric: She has a normal mood and affect.  Nursing note and vitals reviewed.    ED Treatments / Results  Labs (all labs ordered are listed, but only abnormal results are displayed) Labs Reviewed  BASIC METABOLIC PANEL - Abnormal; Notable for the following components:      Result Value   Creatinine, Ser 1.17 (*)    GFR calc non Af Amer 45 (*)    GFR calc Af Amer 52 (*)    All other components within normal limits  CBC - Abnormal; Notable for the following components:   Hemoglobin 9.4 (*)    HCT 33.3 (*)    MCV 70.6 (*)    MCH 19.9 (*)    MCHC 28.2 (*)    RDW 19.6 (*)    Platelets 458 (*)    All other components within normal limits  HEPATIC FUNCTION PANEL - Abnormal; Notable for the following components:   Total Protein 6.0 (*)    All other components within normal limits  LIPASE, BLOOD - Abnormal; Notable for the following components:   Lipase 64 (*)    All other components within normal limits  I-STAT TROPONIN, ED    EKG None  Radiology Dg Chest 2 View  Result Date: 08/13/2017 CLINICAL DATA:  Left-sided chest pain. EXAM: CHEST - 2 VIEW COMPARISON:  None. FINDINGS: The heart size and mediastinal contours are within normal limits. Both lungs are clear. The visualized skeletal structures are unremarkable. IMPRESSION: No active cardiopulmonary disease. Electronically Signed   By: Earle Gell M.D.   On: 08/13/2017 12:27   Ct Angio Chest/abd/pel For Dissection W And/or Wo Contrast  Result Date: 08/13/2017 CLINICAL DATA:  Chest pain. EXAM: CT ANGIOGRAPHY CHEST, ABDOMEN AND PELVIS TECHNIQUE: Multidetector CT imaging through the chest, abdomen and pelvis was performed using the standard protocol during bolus administration of intravenous contrast. Multiplanar reconstructed images and MIPs were obtained and reviewed to evaluate the vascular anatomy. CONTRAST:  100  mL ISOVUE-370 IOPAMIDOL (ISOVUE-370) INJECTION 76% COMPARISON:  Radiographs of same day.  CT scan of June 14, 2017. FINDINGS: CTA CHEST FINDINGS Cardiovascular: Preferential opacification of the thoracic aorta. No evidence of thoracic aortic aneurysm or dissection. Normal heart size. No pericardial effusion. Mediastinum/Nodes: 1.5 cm right thyroid nodule is noted. Thyroid ultrasound is recommended for further evaluation. No adenopathy is noted. The esophagus is unremarkable. Lungs/Pleura: No pneumothorax or pleural effusion is noted. Probable scarring is noted in the inferior portion of the right middle lobe as well as the lingular segment of the left upper lobe. 9 mm nodular density is noted in the lingular segment of left upper lobe best seen on image number 57 of series  7. Several other smaller nodular densities are seen in this area. 4 mm nodule is seen in right lower lobe best seen on image number 66 of series 7. Musculoskeletal: No chest wall abnormality. No acute or significant osseous findings. Review of the MIP images confirms the above findings. CTA ABDOMEN AND PELVIS FINDINGS VASCULAR Aorta: Atherosclerosis of abdominal aorta is noted without aneurysm formation. Probable small dissection is seen in the infrarenal portion. Celiac: Patent without evidence of aneurysm, dissection, vasculitis or significant stenosis. SMA: Patent without evidence of aneurysm, dissection, vasculitis or significant stenosis. Renals: Both renal arteries are patent without evidence of aneurysm, dissection, vasculitis, fibromuscular dysplasia or significant stenosis. IMA: Patent without evidence of aneurysm, dissection, vasculitis or significant stenosis. Inflow: Patent without evidence of aneurysm, dissection, vasculitis or significant stenosis. Veins: No obvious venous abnormality within the limitations of this arterial phase study. Review of the MIP images confirms the above findings. NON-VASCULAR Hepatobiliary: No focal liver  abnormality is seen. No gallstones, gallbladder wall thickening, or biliary dilatation. Pancreas: Unremarkable. No pancreatic ductal dilatation or surrounding inflammatory changes. Spleen: Normal in size without focal abnormality. Adrenals/Urinary Tract: Adrenal glands are unremarkable. Kidneys are normal, without renal calculi, focal lesion, or hydronephrosis. Bladder is unremarkable. Stomach/Bowel: The stomach appears normal. There is no evidence of bowel obstruction or inflammation. The appendix is not visualized. Lymphatic: No significant adenopathy is noted. Reproductive: Status post hysterectomy. No adnexal masses. Other: No abdominal wall hernia or abnormality. No abdominopelvic ascites. Musculoskeletal: No acute or significant osseous findings. Review of the MIP images confirms the above findings. IMPRESSION: No evidence of thoracic aortic dissection or aneurysm. Small focal dissection is seen involving infrarenal portion of abdominal aorta. No significant mesenteric or renal artery stenosis is noted. 1.5 cm right thyroid nodule. Thyroid ultrasound is recommended for further evaluation. Multiple nodules are noted in both lungs which may be post inflammatory in origin, but largest measures 9 mm in lingular portion of left upper lobe. Non-contrast chest CT at 3-6 months is recommended. If the nodules are stable at time of repeat CT, then future CT at 18-24 months (from today's scan) is considered optional for low-risk patients, but is recommended for high-risk patients. This recommendation follows the consensus statement: Guidelines for Management of Incidental Pulmonary Nodules Detected on CT Images: From the Fleischner Society 2017; Radiology 2017; 284:228-243. Aortic Atherosclerosis (ICD10-I70.0). Electronically Signed   By: Marijo Conception, M.D.   On: 08/13/2017 13:14    Procedures Procedures (including critical care time)  CRITICAL CARE Performed by: Gardiner Sleeper Total critical care time:  60 minutes Critical care time was exclusive of separately billable procedures and treating other patients. Critical care was necessary to treat or prevent imminent or life-threatening deterioration. Critical care was time spent personally by me on the following activities: development of treatment plan with patient and/or surrogate as well as nursing, discussions with consultants, evaluation of patient's response to treatment, examination of patient, obtaining history from patient or surrogate, ordering and performing treatments and interventions, ordering and review of laboratory studies, ordering and review of radiographic studies, pulse oximetry and re-evaluation of patient's condition.   Medications Ordered in ED Medications  alum & mag hydroxide-simeth (MAALOX/MYLANTA) 200-200-20 MG/5ML suspension 15 mL (has no administration in time range)  lidocaine (LIDODERM) 5 % 1 patch (has no administration in time range)  iopamidol (ISOVUE-370) 76 % injection 100 mL (100 mLs Intravenous Contrast Given 08/13/17 1231)     Initial Impression / Assessment and Plan / ED Course  I  have reviewed the triage vital signs and the nursing notes.  Pertinent labs & imaging results that were available during my care of the patient were reviewed by me and considered in my medical decision making (see chart for details).     Patient is a 72 year old female presenting with acute chest pain.  Patient has past medical history significant for migraines insomnia depression, mood disorder.  She lives at home with a home health aide.  According to patient's daughter patient was trying to turn on TV and got very frustrated that when at work, she became tearful crying out and then developed some left-sided chest pain.  On arrival patient was inconsolable.  Initially patient kept her eyes closed throughout the exam with her jaw clenched acting very oddly.   Talk to daughter outside of room, patient has significant anxiety and  depression and associated dementia.  Throughout HPI, patient relaxed, was able to converse, and appeared more comfortable.  She reports she did not have any nausea vomiting sweating or arm pain jaw pain or other complaints until today after the TV wouldnt work.  Patient's daughter think this is reaction that was emotional in nature.  That being said patient expresses a 10 out of 10 pain in the left chest wall radiating to her back.  1:00  Patient has normal vital signs.  Concerning story of chest pain rating to the back.  Will get dissection study however think this is likely emotional in nature.  2:00 PM Dissection study shows infrarenal dissection.  Discussed with Dr. Bridgett Larsson, vascular.  He recommends admission for internal medicine for medication management and he will consult.  3:39 PM Will admit to internal medicine for medical management of type B dissection.  Blood pressure currently controlled at 118/85.  Ultimately I do not think is actually related to the pain that patient is having.  Warned patient to watch for signs of rash as it is more consistent with a developing shingles.  Patient given lidocaine patch, Maalox for potential heartburn as etiology.    Final Clinical Impressions(s) / ED Diagnoses   Final diagnoses:  None    ED Discharge Orders    None       [place], Fredia Sorrow, MD 08/13/17 1540

## 2017-08-13 NOTE — Progress Notes (Signed)
   Daily Progress Note  Briefly reviewed this patient's CTA chest/abd/pelvis.  Her abdominal findings don't account for her sx.  Her abdominal aortic findings may be penetrating aortic ulcers vs focal dissection.  Neither require any immediate intervention.  - Treatment of type B dissection is medical management of blood pressure - Treatment of pentrating aortic ulcer involves medical management of atherosclerosis, as it is felt PAU are due to the same underlying pathophysiology.   - Will be by to see the patient later tonight or tomorrow due to emergent cases in [place], MD, [facility] [place]: [phone] Pager: [phone]  08/13/2017, 6:03 PM

## 2017-08-13 NOTE — Consult Note (Signed)
Requested by:  Dr. Zenovia Jarred [facility] ED)  Reason for consultation: aortic dissection    History of Present Illness   Jamie George is a 72 y.o. (10/24/1945) female who presents with cc: left chest/breast pain.  Pt notes prior episode of similar sx but lesser intensity.  Pt notes onset of severe left chest/breast pain that was described as "pressure pain"  The patient was not aware of any obvious trigger.  She denies any tearing back or chest pain.  Past Medical History:  Diagnosis Date  . Cancer ([place])    skin  . Complication of anesthesia    " difficult to wake me up "  . Depression   . Duodenitis   . GERD (gastroesophageal reflux disease)   . Insomnia   . Migraines   . Mild dementia     Past Surgical History:  Procedure Laterality Date  . ABDOMINAL HYSTERECTOMY    . BACK SURGERY    . BRAIN SURGERY  2013   meningioma  . SHOULDER SURGERY       Social History   Socioeconomic History  . Marital status: Widowed    Spouse name: Not on file  . Number of children: Not on file  . Years of education: Not on file  . Highest education level: Not on file  Occupational History  . Not on file  Social Needs  . Financial resource strain: Not on file  . Food insecurity:    Worry: Not on file    Inability: Not on file  . Transportation needs:    Medical: Not on file    Non-medical: Not on file  Tobacco Use  . Smoking status: Former Research scientist (life sciences)  . Smokeless tobacco: Former Network engineer and Sexual Activity  . Alcohol use: Not Currently  . Drug use: Never  . Sexual activity: Not Currently  Lifestyle  . Physical activity:    Days per week: Not on file    Minutes per session: Not on file  . Stress: Not on file  Relationships  . Social connections:    Talks on phone: Not on file    Gets together: Not on file    Attends religious service: Not on file    Active member of club or organization: Not on file    Attends meetings of clubs or organizations: Not on  file    Relationship status: Not on file  . Intimate partner violence:    Fear of current or ex partner: Not on file    Emotionally abused: Not on file    Physically abused: Not on file    Forced sexual activity: Not on file  Other Topics Concern  . Not on file  Social History Narrative   Lives alone in home.  Retired.  Widowed.  Children 2 (daughter, [name]).  Caffeine some.     Family History  Problem Relation Age of Onset  . Cancer Mother   . Stroke Father   . Heart disease Father     Current Facility-Administered Medications  Medication Dose Route Frequency Provider Last Rate Last Dose  . 0.9 %  sodium chloride infusion   Intravenous Continuous Rondel Jumbo, PA-C 75 mL/hr at 08/13/17 1700    . acetaminophen (TYLENOL) tablet 650 mg  650 mg Oral Q6H PRN Sharene Butters E, PA-C       Or  . acetaminophen (TYLENOL) suppository 650 mg  650 mg Rectal Q6H PRN Rondel Jumbo, PA-C      . [  START ON 08/14/2017] aspirin EC tablet 81 mg  81 mg Oral Daily Karmen Bongo, MD      . atorvastatin (LIPITOR) tablet 40 mg  40 mg Oral q1800 Karmen Bongo, MD      . bisacodyl (DULCOLAX) suppository 10 mg  10 mg Rectal Daily PRN Rondel Jumbo, PA-C      . escitalopram (LEXAPRO) tablet 10 mg  10 mg Oral QHS Wertman, Sara E, PA-C      . famotidine (PEPCID) tablet 20 mg  20 mg Oral Daily Wertman, Sara E, PA-C      . hydrALAZINE (APRESOLINE) injection 5 mg  5 mg Intravenous Q6H PRN Rondel Jumbo, PA-C      . HYDROcodone-acetaminophen (NORCO/VICODIN) 5-325 MG per tablet 1-2 tablet  1-2 tablet Oral Q4H PRN Rondel Jumbo, PA-C   2 tablet at 08/13/17 1605  . lidocaine (LIDODERM) 5 % 1 patch  1 patch Transdermal Q24H Rondel Jumbo, PA-C   1 patch at 08/13/17 1405  . morphine 4 MG/ML injection 1 mg  1 mg Intravenous Q4H PRN Rondel Jumbo, PA-C      . ondansetron (ZOFRAN) tablet 4 mg  4 mg Oral Q6H PRN Rondel Jumbo, PA-C       Or  . ondansetron (ZOFRAN) injection 4 mg  4 mg Intravenous Q6H  PRN Rondel Jumbo, PA-C      . senna-docusate (Senokot-S) tablet 1 tablet  1 tablet Oral QHS PRN Rondel Jumbo, PA-C      . traZODone (DESYREL) tablet 50 mg  50 mg Oral QHS Wertman, Sara E, PA-C        No Known Allergies  REVIEW OF SYSTEMS (negative unless checked):   Cardiac:  [x]  Chest pain or chest pressure? []  Shortness of breath upon activity? []  Shortness of breath when lying flat? []  Irregular heart rhythm?  Vascular:  []  Pain in calf, thigh, or hip brought on by walking? []  Pain in feet at night that wakes you up from your sleep? []  Blood clot in your veins? []  Leg swelling?  Pulmonary:  []  Oxygen at home? []  Productive cough? []  Wheezing?  Neurologic:  []  Sudden weakness in arms or legs? []  Sudden numbness in arms or legs? []  Sudden onset of difficult speaking or slurred speech? []  Temporary loss of vision in one eye? []  Problems with dizziness?  Gastrointestinal:  []  Blood in stool? []  Vomited blood?  Genitourinary:  []  Burning when urinating? []  Blood in urine?  Psychiatric:  []  Major depression  Hematologic:  []  Bleeding problems? []  Problems with blood clotting?  Dermatologic:  []  Rashes or ulcers?  Constitutional:  []  Fever or chills?  Ear/Nose/Throat:  []  Change in hearing? []  Nose bleeds? []  Sore throat?  Musculoskeletal:  []  Back pain? []  Joint pain? []  Muscle pain?   Physical Examination     Vitals:   08/13/17 1400 08/13/17 1430 08/13/17 1500 08/13/17 1555  BP: (!) 156/88 (!) 151/86 118/85 (!) 159/85  Pulse: 71 66 63 60  Resp: 14 19 11 12   Temp:      TempSrc:      SpO2: 99% 99% 96% 98%  Weight:      Height:       Body mass index is 16.76 kg/m.  General Alert, O x 3, WD, NAD  Head [place]/AT,    Ear/Nose/ Throat Hearing grossly intact, nares without erythema or drainage, oropharynx without Erythema or Exudate, Mallampati score: 3,   Eyes PERRLA, EOMI,  Neck Supple, mid-line trachea,    Pulmonary Sym exp, good B  air movt, CTA B  Cardiac RRR, Nl S1, S2, no Murmurs, No rubs, No S3,S4  Vascular Vessel Right Left  Radial Palpable Palpable  Brachial Palpable Palpable  Carotid Palpable, No Bruit Palpable, No Bruit  Aorta Not palpable N/A  Femoral Palpable Palpable  Popliteal Not palpable Not palpable  PT Palpable Palpable  DP Palpable Palpable    Gastro- intestinal soft, non-distended, non-tender to palpation, No guarding or rebound, no HSM, no masses, no CVAT B, No palpable prominent aortic pulse,    Musculo- skeletal M/S 5/5 throughout  , Extremities without ischemic changes  , No edema present, No visible varicosities , No Lipodermatosclerosis present  Neurologic Cranial nerves 2-12 intact , Pain and light touch intact in extremities , Motor exam as listed above  Psychiatric Judgement intact, Mood & affect appropriate for pt's clinical situation  Dermatologic See M/S exam for extremity exam, No rashes otherwise noted  Lymphatic  Palpable lymph nodes: None   Laboratory   CBC CBC Latest Ref Rng & Units 08/13/2017 06/14/2017  WBC 4.0 - 10.5 K/uL 10.1 16.6(H)  Hemoglobin 12.0 - 15.0 g/dL 9.4(L) 11.5(L)  Hematocrit 36.0 - 46.0 % 33.3(L) 37.3  Platelets 150 - 400 K/uL 458(H) 546(H)    BMP BMP Latest Ref Rng & Units 08/13/2017 06/14/2017  Glucose 65 - 99 mg/dL 88 97  BUN 6 - 20 mg/dL 14 13  Creatinine 0.44 - 1.00 mg/dL 1.17(H) 1.12(H)  Sodium 135 - 145 mmol/L 139 140  Potassium 3.5 - 5.1 mmol/L 3.7 3.1(L)  Chloride 101 - 111 mmol/L 106 100(L)  CO2 22 - 32 mmol/L 22 25  Calcium 8.9 - 10.3 mg/dL 9.4 9.7    Coagulation No results found for: INR, PROTIME No results found for: PTT  Lipids    Component Value Date/Time   CHOL 216 (H) 08/13/2017 1553   TRIG 102 08/13/2017 1553   HDL 59 08/13/2017 1553   CHOLHDL 3.7 08/13/2017 1553   VLDL 20 08/13/2017 1553   LDLCALC 137 (H) 08/13/2017 1553    Radiology     Dg Chest 2 View  Result Date: 08/13/2017 CLINICAL DATA:  Left-sided chest  pain. EXAM: CHEST - 2 VIEW COMPARISON:  None. FINDINGS: The heart size and mediastinal contours are within normal limits. Both lungs are clear. The visualized skeletal structures are unremarkable. IMPRESSION: No active cardiopulmonary disease. Electronically Signed   By: Earle Gell M.D.   On: 08/13/2017 12:27   Ct Angio Chest/abd/pel For Dissection W And/or Wo Contrast  Result Date: 08/13/2017 CLINICAL DATA:  Chest pain. EXAM: CT ANGIOGRAPHY CHEST, ABDOMEN AND PELVIS TECHNIQUE: Multidetector CT imaging through the chest, abdomen and pelvis was performed using the standard protocol during bolus administration of intravenous contrast. Multiplanar reconstructed images and MIPs were obtained and reviewed to evaluate the vascular anatomy. CONTRAST:  100 mL ISOVUE-370 IOPAMIDOL (ISOVUE-370) INJECTION 76% COMPARISON:  Radiographs of same day.  CT scan of June 14, 2017. FINDINGS: CTA CHEST FINDINGS Cardiovascular: Preferential opacification of the thoracic aorta. No evidence of thoracic aortic aneurysm or dissection. Normal heart size. No pericardial effusion. Mediastinum/Nodes: 1.5 cm right thyroid nodule is noted. Thyroid ultrasound is recommended for further evaluation. No adenopathy is noted. The esophagus is unremarkable. Lungs/Pleura: No pneumothorax or pleural effusion is noted. Probable scarring is noted in the inferior portion of the right middle lobe as well as the lingular segment of the left upper lobe. 9 mm nodular density is  noted in the lingular segment of left upper lobe best seen on image number 57 of series 7. Several other smaller nodular densities are seen in this area. 4 mm nodule is seen in right lower lobe best seen on image number 66 of series 7. Musculoskeletal: No chest wall abnormality. No acute or significant osseous findings. Review of the MIP images confirms the above findings. CTA ABDOMEN AND PELVIS FINDINGS VASCULAR Aorta: Atherosclerosis of abdominal aorta is noted without aneurysm  formation. Probable small dissection is seen in the infrarenal portion. Celiac: Patent without evidence of aneurysm, dissection, vasculitis or significant stenosis. SMA: Patent without evidence of aneurysm, dissection, vasculitis or significant stenosis. Renals: Both renal arteries are patent without evidence of aneurysm, dissection, vasculitis, fibromuscular dysplasia or significant stenosis. IMA: Patent without evidence of aneurysm, dissection, vasculitis or significant stenosis. Inflow: Patent without evidence of aneurysm, dissection, vasculitis or significant stenosis. Veins: No obvious venous abnormality within the limitations of this arterial phase study. Review of the MIP images confirms the above findings. NON-VASCULAR Hepatobiliary: No focal liver abnormality is seen. No gallstones, gallbladder wall thickening, or biliary dilatation. Pancreas: Unremarkable. No pancreatic ductal dilatation or surrounding inflammatory changes. Spleen: Normal in size without focal abnormality. Adrenals/Urinary Tract: Adrenal glands are unremarkable. Kidneys are normal, without renal calculi, focal lesion, or hydronephrosis. Bladder is unremarkable. Stomach/Bowel: The stomach appears normal. There is no evidence of bowel obstruction or inflammation. The appendix is not visualized. Lymphatic: No significant adenopathy is noted. Reproductive: Status post hysterectomy. No adnexal masses. Other: No abdominal wall hernia or abnormality. No abdominopelvic ascites. Musculoskeletal: No acute or significant osseous findings. Review of the MIP images confirms the above findings. IMPRESSION: No evidence of thoracic aortic dissection or aneurysm. Small focal dissection is seen involving infrarenal portion of abdominal aorta. No significant mesenteric or renal artery stenosis is noted. 1.5 cm right thyroid nodule. Thyroid ultrasound is recommended for further evaluation. Multiple nodules are noted in both lungs which may be post  inflammatory in origin, but largest measures 9 mm in lingular portion of left upper lobe. Non-contrast chest CT at 3-6 months is recommended. If the nodules are stable at time of repeat CT, then future CT at 18-24 months (from today's scan) is considered optional for low-risk patients, but is recommended for high-risk patients. This recommendation follows the consensus statement: Guidelines for Management of Incidental Pulmonary Nodules Detected on CT Images: From the Fleischner Society 2017; Radiology 2017; 284:228-243. Aortic Atherosclerosis (ICD10-I70.0). Electronically Signed   By: Marijo Conception, M.D.   On: 08/13/2017 13:14   I reviewed the CTA, there is nothing that accounts for this patient's sx.  The "dissection" looks more like a PAU to me.  There is already a rime of thrombus in this dissection vs PAU, suggesting this probably chronic.   Medical Decision Making   Jamie George is a 72 y.o. female who presents with: asx abdominal aortic penetrating ulcers vs focal dissection   Pt's sx are not accounted for by the CTA.  I suspect the CTA findings are chronic as evident by the few images with a suggestion of thrombus in the dissection flap vs PAU.  No immediate surgical interventions are needed.  Would focus on maximal medical mgmt of atherosclerosis, as this should help managed both pathologies.  Patient can follow up in the office in 6-12 months for further evaluation with interval aortic duplex.  Thank you for allowing [place] to participate in this patient's care.   Adele Barthel, MD, FACS [facility] Office: [phone] Pager: [phone]  08/13/2017, 7:28 PM

## 2017-08-13 NOTE — ED Triage Notes (Signed)
Per [facility] pt coming from home c/o chest pain onset of 8 this am. Left sided chest pain under breast, describes it as sharp and constant. Given 324 aspirin, 2 nitro and 6mg  morphine with no relief.

## 2017-08-13 NOTE — ED Notes (Signed)
Patient ambulated to restroom with standby assist.  

## 2017-08-13 NOTE — H&P (Addendum)
History and Physical    Jamie George:[id number] DOB: [birth year] DOA: 08/13/2017   PCP: Marda Stalker, PA-C   Patient coming from:  Home    Chief Complaint: left sided chest pain   HPI: Jamie George is a 72 y.o. female with medical history significant for known pulmonary nodules, anxiety, depression, insomnia, migraines, history of duodenitis, presenting to the emergency department with acute onset of left-sided chest pain radiating to her back.  For patient's report, she was trying to turn the TV on, and became very frustrated, began crying, and time, developed left-sided chest pain, worse with deep breathing.  She did not report any nausea or vomiting, or diaphoresis, arm or jaw pain.  She states that the left-sided chest pain could be reproduced.  He abdominal pain, dysuria, gross hematuria, constipation, lower extremity swelling or pain.  Denies any new rashes.  She denies a history of shingles.  She denies any new changes in medications.  He does not take daily aspirin.  He denies any tobacco, alcohol or recreational drug use.  Denies hormonal products or recent long distance trips.  She denies any history of cardiac disease.  She does have a history of microvascular disease in the brain, she denies any history of stroke.  She received 324 mg of aspirin, 2 nitros, 6 mg of morphine, without relief.  ED Course:  BP (!) 156/88   Pulse 71   Temp 98.6 F (37 C) (Oral)   Resp 14   Ht 5\' 7"  (1.702 m)   Wt 48.5 kg (107 lb)   SpO2 99%   BMI 16.76 kg/m   CT Angio of the chest showed no evidence of thoracic aortic dissection or aneurysm.  Small focal dissection was seen involving the infrarenal portion of the abdominal aorta, without significant mesenteric or renal artery stenosis.  Other findings were remarkable for a 1.5 cm right thyroid nodule, and multiple lung more nodules, which were  seen primarily in another CT and is being followed as an outpatient. Troponin 0 EKG with  normal ST depression injuries from her prior one. Chemistries remarkable for a creatinine of 1.17, GFR in the 40s, similar to prior labs in April 2019 CBC shows a hemoglobin of 9.4, which is 2 g less than in April 2019 at 11.5.  White count is normal at 10.1, platelets 458  Review of Systems:  As per HPI otherwise all other systems reviewed and are negative  Past Medical History:  Diagnosis Date  . Cancer ([place])    skin  . Depression   . Duodenitis   . Insomnia   . Migraines   . Mild dementia     Past Surgical History:  Procedure Laterality Date  . ABDOMINAL HYSTERECTOMY    . BACK SURGERY    . BRAIN SURGERY  2013   meningioma  . SHOULDER SURGERY      Social History Social History   Socioeconomic History  . Marital status: Widowed    Spouse name: Not on file  . Number of children: Not on file  . Years of education: Not on file  . Highest education level: Not on file  Occupational History  . Not on file  Social Needs  . Financial resource strain: Not on file  . Food insecurity:    Worry: Not on file    Inability: Not on file  . Transportation needs:    Medical: Not on file    Non-medical: Not on file  Tobacco Use  .  Smoking status: Former Research scientist (life sciences)  . Smokeless tobacco: Former Network engineer and Sexual Activity  . Alcohol use: Not Currently  . Drug use: Never  . Sexual activity: Not Currently  Lifestyle  . Physical activity:    Days per week: Not on file    Minutes per session: Not on file  . Stress: Not on file  Relationships  . Social connections:    Talks on phone: Not on file    Gets together: Not on file    Attends religious service: Not on file    Active member of club or organization: Not on file    Attends meetings of clubs or organizations: Not on file    Relationship status: Not on file  . Intimate partner violence:    Fear of current or ex partner: Not on file    Emotionally abused: Not on file    Physically abused: Not on file    Forced  sexual activity: Not on file  Other Topics Concern  . Not on file  Social History Narrative   Lives alone in home.  Retired.  Widowed.  Children 2 (daughter, [name]).  Caffeine some.      No Known Allergies  Family History  Problem Relation Age of Onset  . Cancer Mother   . Stroke Father   . Heart disease Father        Prior to Admission medications   Medication Sig Start Date End Date Taking? Authorizing Provider  aspirin-acetaminophen-caffeine (EXCEDRIN MIGRAINE) [phone] MG tablet Take 1 tablet by mouth every 6 (six) hours as needed for headache.   Yes [provider]  escitalopram (LEXAPRO) 10 MG tablet Take 10 mg by mouth at bedtime.  05/03/17  Yes [provider]  MELATONIN-THEANINE PO Take 1-3 tablets by mouth at bedtime.   Yes [provider]  ranitidine (ZANTAC) 150 MG tablet Take 150 mg by mouth 2 (two) times daily as needed for heartburn.   Yes [provider]  traZODone (DESYREL) 50 MG tablet Take 50 mg by mouth at bedtime.  05/03/17  Yes [provider]     Physical Exam:  Vitals:   08/13/17 1115 08/13/17 1147 08/13/17 1200 08/13/17 1400  BP: 139/88 (!) 146/83 125/69 (!) 156/88  Pulse: 82 66 74 71  Resp: 14 16 18 14   Temp: 98.6 F (37 C)     TempSrc: Oral     SpO2: 99% 99% 100% 99%  Weight:      Height:       Constitutional: NAD, very anxious, very conversant Eyes: PERRL, lids and conjunctivae normal ENMT: Mucous membranes are moist, without exudate or lesions  Neck: normal, supple, no masses, no thyromegaly Respiratory: clear to auscultation bilaterally, no wheezing, no crackles. Normal respiratory effort  Cardiovascular: Regular rate and rhythm, 2 out of 6 murmur, rubs or gallops. No extremity edema. 2+ pedal pulses. No carotid bruits.  Abdomen: Soft, non tender, No hepatosplenomegaly. Bowel sounds positive.  No CVAT Musculoskeletal: no clubbing / cyanosis. Moves all extremities.  Exquisite tenderness on the  left lower rib region  Skin: no jaundice, No lesions.  Neurologic: Sensation intact  Strength equal in all extremities Psychiatric:   Alert and oriented x 3..  She is mood.     Labs on Admission: I have personally reviewed following labs and imaging studies  CBC: Recent Labs  Lab 08/13/17 1126  WBC 10.1  HGB 9.4*  HCT 33.3*  MCV 70.6*  PLT 458*    Basic  Metabolic Panel: Recent Labs  Lab 08/13/17 1126  NA 139  K 3.7  CL 106  CO2 22  GLUCOSE 88  BUN 14  CREATININE 1.17*  CALCIUM 9.4    GFR: Estimated Creatinine Clearance: 32.8 mL/min (A) (by C-G formula based on SCr of 1.17 mg/dL (H)).  Liver Function Tests: Recent Labs  Lab 08/13/17 1126  AST 22  ALT 19  ALKPHOS 49  BILITOT 0.7  PROT 6.0*  ALBUMIN 3.7   Recent Labs  Lab 08/13/17 1126  LIPASE 64*   No results for input(s): AMMONIA in the last 168 hours.  Coagulation Profile: No results for input(s): INR, PROTIME in the last 168 hours.  Cardiac Enzymes: No results for input(s): CKTOTAL, CKMB, CKMBINDEX, TROPONINI in the last 168 hours.  BNP (last 3 results) No results for input(s): PROBNP in the last 8760 hours.  HbA1C: No results for input(s): HGBA1C in the last 72 hours.  CBG: No results for input(s): GLUCAP in the last 168 hours.  Lipid Profile: No results for input(s): CHOL, HDL, LDLCALC, TRIG, CHOLHDL, LDLDIRECT in the last 72 hours.  Thyroid Function Tests: No results for input(s): TSH, T4TOTAL, FREET4, T3FREE, THYROIDAB in the last 72 hours.  Anemia Panel: No results for input(s): VITAMINB12, FOLATE, FERRITIN, TIBC, IRON, RETICCTPCT in the last 72 hours.  Urine analysis:    Component Value Date/Time   COLORURINE YELLOW 06/14/2017 1802   APPEARANCEUR HAZY (A) 06/14/2017 1802   LABSPEC 1.029 06/14/2017 1802   PHURINE 9.0 (H) 06/14/2017 1802   GLUCOSEU NEGATIVE 06/14/2017 1802   HGBUR NEGATIVE 06/14/2017 1802   BILIRUBINUR NEGATIVE 06/14/2017 1802   KETONESUR 20 (A) 06/14/2017  1802   PROTEINUR NEGATIVE 06/14/2017 1802   NITRITE NEGATIVE 06/14/2017 1802   LEUKOCYTESUR NEGATIVE 06/14/2017 1802    Sepsis Labs: @LABRCNTIP (procalcitonin:4,lacticidven:4) )No results found for this or any previous visit (from the past 240 hour(s)).   Radiological Exams on Admission: Dg Chest 2 View  Result Date: 08/13/2017 CLINICAL DATA:  Left-sided chest pain. EXAM: CHEST - 2 VIEW COMPARISON:  None. FINDINGS: The heart size and mediastinal contours are within normal limits. Both lungs are clear. The visualized skeletal structures are unremarkable. IMPRESSION: No active cardiopulmonary disease. Electronically Signed   By: Earle Gell M.D.   On: 08/13/2017 12:27   Ct Angio Chest/abd/pel For Dissection W And/or Wo Contrast  Result Date: 08/13/2017 CLINICAL DATA:  Chest pain. EXAM: CT ANGIOGRAPHY CHEST, ABDOMEN AND PELVIS TECHNIQUE: Multidetector CT imaging through the chest, abdomen and pelvis was performed using the standard protocol during bolus administration of intravenous contrast. Multiplanar reconstructed images and MIPs were obtained and reviewed to evaluate the vascular anatomy. CONTRAST:  100 mL ISOVUE-370 IOPAMIDOL (ISOVUE-370) INJECTION 76% COMPARISON:  Radiographs of same day.  CT scan of June 14, 2017. FINDINGS: CTA CHEST FINDINGS Cardiovascular: Preferential opacification of the thoracic aorta. No evidence of thoracic aortic aneurysm or dissection. Normal heart size. No pericardial effusion. Mediastinum/Nodes: 1.5 cm right thyroid nodule is noted. Thyroid ultrasound is recommended for further evaluation. No adenopathy is noted. The esophagus is unremarkable. Lungs/Pleura: No pneumothorax or pleural effusion is noted. Probable scarring is noted in the inferior portion of the right middle lobe as well as the lingular segment of the left upper lobe. 9 mm nodular density is noted in the lingular segment of left upper lobe best seen on image number 57 of series 7. Several other smaller  nodular densities are seen in this area. 4 mm nodule is seen in right lower  lobe best seen on image number 66 of series 7. Musculoskeletal: No chest wall abnormality. No acute or significant osseous findings. Review of the MIP images confirms the above findings. CTA ABDOMEN AND PELVIS FINDINGS VASCULAR Aorta: Atherosclerosis of abdominal aorta is noted without aneurysm formation. Probable small dissection is seen in the infrarenal portion. Celiac: Patent without evidence of aneurysm, dissection, vasculitis or significant stenosis. SMA: Patent without evidence of aneurysm, dissection, vasculitis or significant stenosis. Renals: Both renal arteries are patent without evidence of aneurysm, dissection, vasculitis, fibromuscular dysplasia or significant stenosis. IMA: Patent without evidence of aneurysm, dissection, vasculitis or significant stenosis. Inflow: Patent without evidence of aneurysm, dissection, vasculitis or significant stenosis. Veins: No obvious venous abnormality within the limitations of this arterial phase study. Review of the MIP images confirms the above findings. NON-VASCULAR Hepatobiliary: No focal liver abnormality is seen. No gallstones, gallbladder wall thickening, or biliary dilatation. Pancreas: Unremarkable. No pancreatic ductal dilatation or surrounding inflammatory changes. Spleen: Normal in size without focal abnormality. Adrenals/Urinary Tract: Adrenal glands are unremarkable. Kidneys are normal, without renal calculi, focal lesion, or hydronephrosis. Bladder is unremarkable. Stomach/Bowel: The stomach appears normal. There is no evidence of bowel obstruction or inflammation. The appendix is not visualized. Lymphatic: No significant adenopathy is noted. Reproductive: Status post hysterectomy. No adnexal masses. Other: No abdominal wall hernia or abnormality. No abdominopelvic ascites. Musculoskeletal: No acute or significant osseous findings. Review of the MIP images confirms the above  findings. IMPRESSION: No evidence of thoracic aortic dissection or aneurysm. Small focal dissection is seen involving infrarenal portion of abdominal aorta. No significant mesenteric or renal artery stenosis is noted. 1.5 cm right thyroid nodule. Thyroid ultrasound is recommended for further evaluation. Multiple nodules are noted in both lungs which may be post inflammatory in origin, but largest measures 9 mm in lingular portion of left upper lobe. Non-contrast chest CT at 3-6 months is recommended. If the nodules are stable at time of repeat CT, then future CT at 18-24 months (from today's scan) is considered optional for low-risk patients, but is recommended for high-risk patients. This recommendation follows the consensus statement: Guidelines for Management of Incidental Pulmonary Nodules Detected on CT Images: From the Fleischner Society 2017; Radiology 2017; 284:228-243. Aortic Atherosclerosis (ICD10-I70.0). Electronically Signed   By: Marijo Conception, M.D.   On: 08/13/2017 13:14    EKG: Independently reviewed.  Assessment/Plan Principal Problem:   Dissection of abdominal aorta (HCC) Active Problems:   Chest pain   Hypertension   CKD (chronic kidney disease)   Anemia  Chest Pain reproducible on respiration, and on palpation.  EKG on sinus rhythm, without acute findings.  Troponin negative.  CT Angie of the chest showed no evidence of thoracic aortic dissection or aneurysm.  However, a small focal dissection involving the infrarenal portion of the abdominal aorta has been noted, as well as multiple nodules in both lungs, likely postinflammatory in origin.  She received aspirin, nitroglycerin, morphine without significant relief.  Due to the findings in the CT angios, Dr. Bridgett Larsson has been consulted, and is to see the patient today, to determine if these requires intervention while in the hospital. Telemetry observation EKG in a.m Continue pain control Echo in view of murmur and chest pain,  Small  dissection  Appreciate cardiovascular surgery consultation.  Will keep n.p.o. for now   Elevated lipase at 64, no prior history of pancreatitis, recent duodenitis LFTS normal . No history of ETOH or DM  Lipid panel  pain control IVF  May need  GI eval if pain worse      Pancreatitis: Lipase , AST ALT. Alkaline phosphatase . AF VSS, WBC CT abdomen showing  No history of previous pancreatic flare. Patient is not an ETOH drinker. Still has gallbladder.  Inpatient  NPO  Gentle IVF (ESRD) lipid panel  US abdomen  Consider GI consult if not improving   Hypertension BP  156/88   Pulse 71   not on any meds at home, will need in order to maintain blood pressure at the lower level, in view of above  Add Hydralazine Q6 hours as needed for BP 160/90   Chronic kidney disease stage 2 Current Cr is 1.17 stable  Lab Results  Component Value Date   CREATININE 1.17 (H) 08/13/2017   CREATININE 1.12 (H) 06/14/2017   Repeat BMET in am  Hold NSAIDS    Thyroid nodule CT angio of the chest shows a 1.5 cm right thyroid nodule. No neck pain or fever . Cannot palpate on exam  Will consider Thyroid ultrasound  Check TSH and T4  Multiple pulmonary nodules, known This can be followed as an outpatient  Anemia, acute on chronic (rule out possible bleed in view f small dissection )  Hemoglobin on admission  9.4, which is 2 g less than in April 2019 at 11.5 Hemoccult  Repeat CBC in am  No transfusion is indicated at this time Anemia panel  Type and screen   Depression/ Anxiety Continue home Lexapro, desyrel       DVT prophylaxis: SCD Code Status:    Full Family Communication:  Discussed with patient Disposition Plan: Expect patient to be discharged to home after condition improves Consults called:   Dr. Bridgett Larsson, thoracic and vascular surgery Admission status: Telemetry observation   Sharene Butters, PA-C [facility]   Amion text  [phone]   08/13/2017, 2:47 PM

## 2017-08-13 NOTE — ED Notes (Signed)
Patient transported to X-ray 

## 2017-08-13 NOTE — ED Notes (Signed)
Family at bedside. 

## 2017-08-14 DIAGNOSIS — I7102 Dissection of abdominal aorta: Secondary | ICD-10-CM | POA: Diagnosis not present

## 2017-08-14 LAB — BASIC METABOLIC PANEL
Anion gap: 9 (ref 5–15)
BUN: 10 mg/dL (ref 8–23)
CHLORIDE: 105 mmol/L (ref 98–111)
CO2: 27 mmol/L (ref 22–32)
CREATININE: 1.03 mg/dL — AB (ref 0.44–1.00)
Calcium: 9.2 mg/dL (ref 8.9–10.3)
GFR calc Af Amer: >60 mL/min (ref >60)
GFR calc non Af Amer: 53 mL/min — ABNORMAL LOW (ref >60)
Glucose, Bld: 90 mg/dL (ref 70–99)
Potassium: 3.8 mmol/L (ref 3.5–5.1)
Sodium: 141 mmol/L (ref 135–145)

## 2017-08-14 LAB — CBC
HCT: 33.4 % — ABNORMAL LOW (ref 36.0–46.0)
Hemoglobin: 9.1 g/dL — ABNORMAL LOW (ref 12.0–15.0)
MCH: 19.4 pg — ABNORMAL LOW (ref 26.0–34.0)
MCHC: 27.2 g/dL — AB (ref 30.0–36.0)
MCV: 71.2 fL — AB (ref 78.0–100.0)
PLATELETS: 425 10*3/uL — AB (ref 150–400)
RBC: 4.69 MIL/uL (ref 3.87–5.11)
RDW: 19.6 % — AB (ref 11.5–15.5)
WBC: 10.2 10*3/uL (ref 4.0–10.5)

## 2017-08-14 LAB — ABO/RH: ABO/RH(D): O POS

## 2017-08-14 MED ORDER — AMLODIPINE BESYLATE 5 MG PO TABS
5.0000 mg | ORAL_TABLET | Freq: Every day | ORAL | Status: DC
  Administered 2017-08-14: 5 mg via ORAL
  Filled 2017-08-14: qty 1

## 2017-08-14 MED ORDER — AMLODIPINE BESYLATE 5 MG PO TABS: 5.0000 mg | ORAL_TABLET | Freq: Every day | ORAL | 2 refills | Status: DC

## 2017-08-14 MED ORDER — ATORVASTATIN CALCIUM 40 MG PO TABS: 40.0000 mg | ORAL_TABLET | Freq: Every day | ORAL | 2 refills | Status: DC

## 2017-08-14 MED ORDER — ACETAMINOPHEN 325 MG PO TABS: 650.0000 mg | ORAL_TABLET | Freq: Four times a day (QID) | ORAL | 1 refills | Status: DC | PRN

## 2017-08-14 MED ORDER — ONDANSETRON HCL 4 MG PO TABS: 4.0000 mg | ORAL_TABLET | Freq: Four times a day (QID) | ORAL | 0 refills | Status: DC | PRN

## 2017-08-14 MED ORDER — ASPIRIN 81 MG PO TBEC: 81.0000 mg | DELAYED_RELEASE_TABLET | Freq: Every day | ORAL | 2 refills | Status: AC

## 2017-08-14 MED ORDER — ISOSORBIDE MONONITRATE ER 30 MG PO TB24
30.0000 mg | ORAL_TABLET | Freq: Every day | ORAL | Status: DC
  Administered 2017-08-14: 30 mg via ORAL
  Filled 2017-08-14: qty 1

## 2017-08-14 MED ORDER — PANTOPRAZOLE SODIUM 40 MG PO TBEC: 40.0000 mg | DELAYED_RELEASE_TABLET | Freq: Once | ORAL | Status: DC

## 2017-08-14 MED ORDER — FERROUS SULFATE 325 (65 FE) MG PO TBEC: 325.0000 mg | DELAYED_RELEASE_TABLET | Freq: Two times a day (BID) | ORAL | 3 refills | Status: DC

## 2017-08-14 MED ORDER — PANTOPRAZOLE SODIUM 40 MG PO TBEC: 40.0000 mg | DELAYED_RELEASE_TABLET | Freq: Two times a day (BID) | ORAL | 2 refills | Status: DC

## 2017-08-14 NOTE — Progress Notes (Signed)
Pt discharged home with daughter. IV and telemetry box removed. Pt and pt's daughter received discharge instructions and all questions were answered. Pt received paper prescriptions and was instructed to get them filled at her pharmacy. Pt verbalized understanding. Pt left with all of her belongings. Pt discharged via wheelchair and was accompanied by her daughter and nurse tech.   Ara Kussmaul BSN, RN

## 2017-08-14 NOTE — Discharge Summary (Signed)
Jamie George, is a 72 y.o. female  DOB 19-May-1945  MRN [id number].  Admission date:  08/13/2017  Admitting Physician  Karmen Bongo, MD  Discharge Date:  08/14/2017   Primary MD  Marda Stalker, PA-C  Recommendations for primary care physician for things to follow:   1)You have iron deficiency anemia--- probably due to peptic ulcer disease with Ulcers and irritation of your stomach and duodenum [small bowel]  2)Avoid ibuprofen/Advil/Aleve/Motrin/Goody Powders/Naproxen/BC powders/Meloxicam/Excedrin Migraine/ diclofenac/Indomethacin and other Nonsteroidal anti-inflammatory medications as these will make you more likely to bleed and can cause stomach ulcers, can also cause Kidney problems.   3)Have your primary care physician refer you to gastroenterology for possible upper endoscopy and colonoscopy as part of work-up for your iron deficiency anemia  4)Stop Excedrin Migraine and other NSAIDs/nonsteroidal anti-inflammatory agents-  5)Take Protonix as prescribed for your presumed peptic ulcer disease with stomach and duodenal ulcers and irritation  6)We need to keep your LDL/bad cholesterol less than 70----Low-Fat/Low-Cholesterol Diet along with Lipitor 40 mg every evening advised  7)Repeat CBC/complete blood count Test in 1 to 2 weeks  8)Take iron tablets as prescribed along with vitamin C for better absorption   Admission Diagnosis  Chest Pain  Discharge Diagnosis  Chest Pain    Principal Problem:   Dissection of abdominal aorta (HCC) Active Problems:   Chest pain   Hypertension   CKD (chronic kidney disease)   Anemia      Past Medical History:  Diagnosis Date  . Cancer ([place])    skin  . Complication of anesthesia    " difficult to wake me up "  . Depression   . Duodenitis   . GERD (gastroesophageal reflux disease)   . Insomnia   . Migraines   . Mild dementia     Past  Surgical History:  Procedure Laterality Date  . ABDOMINAL HYSTERECTOMY    . BACK SURGERY    . BRAIN SURGERY  2013   meningioma  . SHOULDER SURGERY        HPI  from the history and physical done on the day of admission:    PCP: Marda Stalker, PA-C   Patient coming from:  Home    Chief Complaint: left sided chest pain   HPI: Jamie George is a 72 y.o. female with medical history significant for known pulmonary nodules, anxiety, depression, insomnia, migraines, history of duodenitis, presenting to the emergency department with acute onset of left-sided chest pain radiating to her back.  For patient's report, she was trying to turn the TV on, and became very frustrated, began crying, and time, developed left-sided chest pain, worse with deep breathing.  She did not report any nausea or vomiting, or diaphoresis, arm or jaw pain.  She states that the left-sided chest pain could be reproduced.  He abdominal pain, dysuria, gross hematuria, constipation, lower extremity swelling or pain.  Denies any new rashes.  She denies a history of shingles.  She denies any new changes in medications.  He does not  take daily aspirin.  He denies any tobacco, alcohol or recreational drug use.  Denies hormonal products or recent long distance trips.  She denies any history of cardiac disease.  She does have a history of microvascular disease in the brain, she denies any history of stroke.  She received 324 mg of aspirin, 2 nitros, 6 mg of morphine, without relief.  ED Course:  BP (!) 156/88   Pulse 71   Temp 98.6 F (37 C) (Oral)   Resp 14   Ht 5\' 7"  (1.702 m)   Wt 48.5 kg (107 lb)   SpO2 99%   BMI 16.76 kg/m   CT Angio of the chest showed no evidence of thoracic aortic dissection or aneurysm.  Small focal dissection was seen involving the infrarenal portion of the abdominal aorta, without significant mesenteric or renal artery stenosis.  Other findings were remarkable for a 1.5 cm right thyroid  nodule, and multiple lung more nodules, which were  seen primarily in another CT and is being followed as an outpatient. Troponin 0 EKG with normal ST depression injuries from her prior one. Chemistries remarkable for a creatinine of 1.17, GFR in the 40s, similar to prior labs in April 2019 CBC shows a hemoglobin of 9.4, which is 2 g less than in April 2019 at 11.5.  White count is normal at 10.1, platelets 458    Hospital Course:    1)Atypical chest pain----upon further evaluation appears to be mostly left upper epigastric pain suspect GI etiology.  Patient ruled out for ACS by cardiac enzymes and EKG,, echocardiogram without wall motion abnormalities EF is preserved with EF of 55 to 60%, CTA chest without acute PE, thoracic aortic dissection or aneurysm, however there is a small focal possible PAU (penetrating aortic ulcer) Versus Focal Dissection in the infrarenal portion of the abdominal aorta.  Elevated BP noted....  Lipase was mildly elevated, doubt this subtle degree of elevation of lipase explains patient's left upper quadrant pain, no history of alcohol use  2) iron deficiency anemia--- hemoglobin is above 9, serum iron is 18, iron saturation 4, ferritin is down to 7, B12 and folate not low.  Given supplementation, suspect etiology is GI loss, patient is hemodynamically stable, follow-up with PCP for outpatient endoluminal evaluation.  Patient admits to using excessive amount of NSAIDs and Excedrin Migraine up to 4 times a day,Avoid ibuprofen/Advil/Aleve/Motrin/Goody Powders/Naproxen/BC powders/Meloxicam/Diclofenac/Indomethacin and other Nonsteroidal anti-inflammatory medications as these will make you more likely to bleed and can cause stomach ulcers, can also cause Kidney problems.   3)Dyslipidemia--- given atherosclerosis with penetrating aortic ulcer and thrombosis, LDL goal should be less than 70.  LDL is currently 137, give Lipitor and aspirin as ordered low-cholesterol diet  advised  4)HTN--- stage II, benign essential hypertension, start amlodipine 5 mg daily, amlodipine can be titrated up to achieve BP goals  5)Possible PAU (penetrating aortic ulcer) Versus Focal Dissection in the infrarenal portion of the abdominal aorta-consult from Vascular surgery appreciated, please see full consult note from Dr. Adele Barthel.  Control BP and lipids as above  6)Presumed PUD with iron deficiency anemia --- presumed NSAID induced peptic ulcer disease with possible gastritis and gastroduodenitis, CT abd/pelvis from 06/14/2017 suggested duodenitis, patient's left upper quadrant pain and overall symptoms are suggestive of peptic ulcer disease and gastroduodenitis, treat empirically with  Protonix 40 mg bid, avoid NSAIDS.  primary care physician to refer to gastroenterology for possible upper endoscopy and colonoscopy as part of work-up of iron deficiency anemia as outpatient,  patient is hemodynamically stable,  hemoglobin above 9.... Lipase was mildly elevated, doubt this subtle degree of elevation of lipase explains patient's left upper quadrant pain, no history of alcohol use  7)Dementia--- patient recently diagnosed with dementia, follow-up with PCP for ongoing management  8)1.5 CM Rt Thyroid Nodule--- TSH and thyroid function overall appears normal, consider outpatient thyroid ultrasound  Discharge Condition: stable  Follow UP  Consults obtained - Vascular surgery Dr Bridgett Larsson  Diet and Activity recommendation:  As advised  Discharge Instructions    Discharge Instructions    Call MD for:  difficulty breathing, headache or visual disturbances   Complete by:  As directed    Call MD for:  persistant dizziness or light-headedness   Complete by:  As directed    Call MD for:  persistant nausea and vomiting   Complete by:  As directed    Call MD for:  severe uncontrolled pain   Complete by:  As directed    Call MD for:  temperature >100.4   Complete by:  As directed    Diet - low  sodium heart healthy   Complete by:  As directed    Discharge instructions   Complete by:  As directed    1)You have iron deficiency anemia--- probably due to peptic ulcer disease with Ulcers and irritation of your stomach and duodenum [small bowel]  2)Avoid ibuprofen/Advil/Aleve/Motrin/Goody Powders/Naproxen/BC powders/Meloxicam/Excedrin Migraine/ diclofenac/Indomethacin and other Nonsteroidal anti-inflammatory medications as these will make you more likely to bleed and can cause stomach ulcers, can also cause Kidney problems.   3)Have your primary care physician refer you to gastroenterology for possible upper endoscopy and colonoscopy as part of work-up for your iron deficiency anemia  4)Stop Excedrin Migraine and other NSAIDs/nonsteroidal anti-inflammatory agents-  5)Take Protonix as prescribed for your presumed peptic ulcer disease with stomach and duodenal ulcers and irritation  6)We need to keep your LDL/bad cholesterol less than 70----Low-Fat/Low-Cholesterol Diet along with Lipitor 40 mg every evening advised  7)Repeat CBC/complete blood count Test in 1 to 2 weeks  8) take iron tablets as prescribed along with vitamin C for better absorption   Increase activity slowly   Complete by:  As directed         Discharge Medications     Allergies as of 08/14/2017   No Known Allergies     Medication List    STOP taking these medications   EXCEDRIN MIGRAINE 250-250-65 MG tablet Generic drug:  aspirin-acetaminophen-caffeine   ranitidine 150 MG tablet Commonly known as:  ZANTAC     TAKE these medications   acetaminophen 325 MG tablet Commonly known as:  TYLENOL Take 2 tablets (650 mg total) by mouth every 6 (six) hours as needed for mild pain, fever or headache (or Fever >/= 101).   amLODipine 5 MG tablet Commonly known as:  NORVASC Take 1 tablet (5 mg total) by mouth daily. For BP Start taking on:  08/15/2017   aspirin 81 MG EC tablet Take 1 tablet (81 mg total) by  mouth daily. With food Start taking on:  08/15/2017   atorvastatin 40 MG tablet Commonly known as:  LIPITOR Take 1 tablet (40 mg total) by mouth daily at 6 PM. For cholesterol   escitalopram 10 MG tablet Commonly known as:  LEXAPRO Take 10 mg by mouth at bedtime.   ferrous sulfate 325 (65 FE) MG EC tablet Take 1 tablet (325 mg total) by mouth 2 (two) times daily with a meal.   MELATONIN-THEANINE PO Take  1-3 tablets by mouth at bedtime.   ondansetron 4 MG tablet Commonly known as:  ZOFRAN Take 1 tablet (4 mg total) by mouth every 6 (six) hours as needed for nausea.   pantoprazole 40 MG tablet Commonly known as:  PROTONIX Take 1 tablet (40 mg total) by mouth 2 (two) times daily before a meal.   traZODone 50 MG tablet Commonly known as:  DESYREL Take 50 mg by mouth at bedtime.       Major procedures and Radiology Reports - PLEASE review detailed and final reports for all details, in brief    Dg Chest 2 View  Result Date: 08/13/2017 CLINICAL DATA:  Left-sided chest pain. EXAM: CHEST - 2 VIEW COMPARISON:  None. FINDINGS: The heart size and mediastinal contours are within normal limits. Both lungs are clear. The visualized skeletal structures are unremarkable. IMPRESSION: No active cardiopulmonary disease. Electronically Signed   By: Earle Gell M.D.   On: 08/13/2017 12:27   Mr Jeri Cos [place] Contrast  Result Date: 08/06/2017  [facility] [address], [place], [place] [address] [phone] NEUROIMAGING REPORT STUDY DATE: 08/05/2017 PATIENT NAME: Jamie George DOB: 04/23/1945 MRN: [id number] EXAM: MRI Brain with and without contrast ORDERING CLINICIAN: Andrey Spearman MD CLINICAL HISTORY: 72 year old woman with memory loss COMPARISON FILMS: None TECHNIQUE:MRI of the brain with and without contrast was obtained utilizing 5 mm axial slices with T1, T2, T2 flair, SWI and diffusion weighted views.  T1 sagittal, T2 coronal and postcontrast views in the axial  and coronal plane were obtained. CONTRAST: 10 ml [facility] IMAGING SITE: [facility], [place]. FINDINGS: On sagittal images, the spinal cord is imaged caudally to C4 and is normal in caliber.   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   The contents of the posterior fossa are of normal size and position.   The pituitary gland and optic chiasm appear normal.    The third and lateral ventricles are mildly enlarged, in proportion to the extent of mild age-related generalized cortical atrophy..  There are no abnormal extra-axial collections of fluid.  The cerebellum and brainstem appears normal.   The deep gray matter appears normal.  There are some scattered T2/FLAIR hyperintense foci in the deep and periventricular white matter.  None of the foci appears to be acute.  There are also white matter changes of underlying a left frontal craniotomy defect consistent with prior surgery.  All.   Diffusion weighted images are normal.  Susceptibility weighted images are normal.   The orbits appear normal.   The VIIth/VIIIth nerve complex appears normal.  The mastoid air cells appear normal.  The paranasal sinuses appear normal.  Flow voids are identified within the major intracerebral arteries.  After the infusion of contrast material, a normal enhancement pattern is noted.   This MRI of the brain with and without contrast shows the following: 1.    Mild age-related generalized cortical atrophy. 2.    Mild age-related chronic microvascular ischemic change. 3.    Mild white matter changes in the left frontal lobe underlying a craniotomy defect consistent with her prior surgery. 4.    There are no acute findings and there is a normal enhancement pattern. INTERPRETING PHYSICIAN: Richard A. Felecia Shelling, MD, PhD, FAAN Certified in  Neuroimaging by [employer]   Ct Angio Chest/abd/pel For Dissection W And/or Wo Contrast  Result Date: 08/13/2017 CLINICAL DATA:  Chest pain. EXAM: CT  ANGIOGRAPHY CHEST, ABDOMEN  AND PELVIS TECHNIQUE: Multidetector CT imaging through the chest, abdomen and pelvis was performed using the standard protocol during bolus administration of intravenous contrast. Multiplanar reconstructed images and MIPs were obtained and reviewed to evaluate the vascular anatomy. CONTRAST:  100 mL ISOVUE-370 IOPAMIDOL (ISOVUE-370) INJECTION 76% COMPARISON:  Radiographs of same day.  CT scan of June 14, 2017. FINDINGS: CTA CHEST FINDINGS Cardiovascular: Preferential opacification of the thoracic aorta. No evidence of thoracic aortic aneurysm or dissection. Normal heart size. No pericardial effusion. Mediastinum/Nodes: 1.5 cm right thyroid nodule is noted. Thyroid ultrasound is recommended for further evaluation. No adenopathy is noted. The esophagus is unremarkable. Lungs/Pleura: No pneumothorax or pleural effusion is noted. Probable scarring is noted in the inferior portion of the right middle lobe as well as the lingular segment of the left upper lobe. 9 mm nodular density is noted in the lingular segment of left upper lobe best seen on image number 57 of series 7. Several other smaller nodular densities are seen in this area. 4 mm nodule is seen in right lower lobe best seen on image number 66 of series 7. Musculoskeletal: No chest wall abnormality. No acute or significant osseous findings. Review of the MIP images confirms the above findings. CTA ABDOMEN AND PELVIS FINDINGS VASCULAR Aorta: Atherosclerosis of abdominal aorta is noted without aneurysm formation. Probable small dissection is seen in the infrarenal portion. Celiac: Patent without evidence of aneurysm, dissection, vasculitis or significant stenosis. SMA: Patent without evidence of aneurysm, dissection, vasculitis or significant stenosis. Renals: Both renal arteries are patent without evidence of aneurysm, dissection, vasculitis, fibromuscular dysplasia or significant stenosis. IMA: Patent without evidence of aneurysm,  dissection, vasculitis or significant stenosis. Inflow: Patent without evidence of aneurysm, dissection, vasculitis or significant stenosis. Veins: No obvious venous abnormality within the limitations of this arterial phase study. Review of the MIP images confirms the above findings. NON-VASCULAR Hepatobiliary: No focal liver abnormality is seen. No gallstones, gallbladder wall thickening, or biliary dilatation. Pancreas: Unremarkable. No pancreatic ductal dilatation or surrounding inflammatory changes. Spleen: Normal in size without focal abnormality. Adrenals/Urinary Tract: Adrenal glands are unremarkable. Kidneys are normal, without renal calculi, focal lesion, or hydronephrosis. Bladder is unremarkable. Stomach/Bowel: The stomach appears normal. There is no evidence of bowel obstruction or inflammation. The appendix is not visualized. Lymphatic: No significant adenopathy is noted. Reproductive: Status post hysterectomy. No adnexal masses. Other: No abdominal wall hernia or abnormality. No abdominopelvic ascites. Musculoskeletal: No acute or significant osseous findings. Review of the MIP images confirms the above findings. IMPRESSION: No evidence of thoracic aortic dissection or aneurysm. Small focal dissection is seen involving infrarenal portion of abdominal aorta. No significant mesenteric or renal artery stenosis is noted. 1.5 cm right thyroid nodule. Thyroid ultrasound is recommended for further evaluation. Multiple nodules are noted in both lungs which may be post inflammatory in origin, but largest measures 9 mm in lingular portion of left upper lobe. Non-contrast chest CT at 3-6 months is recommended. If the nodules are stable at time of repeat CT, then future CT at 18-24 months (from today's scan) is considered optional for low-risk patients, but is recommended for high-risk patients. This recommendation follows the consensus statement: Guidelines for Management of Incidental Pulmonary Nodules Detected  on CT Images: From the Fleischner Society 2017; Radiology 2017; 284:228-243. Aortic Atherosclerosis (ICD10-I70.0). Electronically Signed   By: Marijo Conception, M.D.   On: 08/13/2017 13:14    Micro Results    No results found for this or any previous visit (from the past  240 hour(s)).  Today   Subjective    Jamie George today has no new complaints, No fever  Or chills , No nausea, vomiting, diarrhea    .  Resolved abdominal pain, patient's daughter at bedside, questions answered       Patient has been seen and examined prior to discharge   Objective   Blood pressure (!) 146/79, pulse 80, temperature 98.4 F (36.9 C), temperature source Oral, resp. rate 13, height 5\' 7"  (1.702 m), weight 48.5 kg (107 lb), SpO2 98 %.   Intake/Output Summary (Last 24 hours) at 08/14/2017 1354 Last data filed at 08/14/2017 0459 Gross per 24 hour  Intake 986.56 ml  Output -  Net 986.56 ml    Exam Gen:- Awake Alert,  In no apparent distress  HEENT:- [place].AT, No sclera icterus Neck-Supple Neck,No JVD,.  Lungs-  CTAB , good air movement CV- S1, S2 normal Abd-  +ve B.Sounds, Abd Soft, No tenderness, Abd tenderness appears to have resolved Extremity/Skin:- No  edema,   good pulses Psych-affect is appropriate, oriented x3 Neuro-no new focal deficits, no tremors   Data Review   CBC w Diff:  Lab Results  Component Value Date   WBC 10.2 08/14/2017   HGB 9.1 (L) 08/14/2017   HCT 33.4 (L) 08/14/2017   PLT 425 (H) 08/14/2017    CMP:  Lab Results  Component Value Date   NA 141 08/14/2017   K 3.8 08/14/2017   CL 105 08/14/2017   CO2 27 08/14/2017   BUN 10 08/14/2017   CREATININE 1.03 (H) 08/14/2017   PROT 6.0 (L) 08/13/2017   ALBUMIN 3.7 08/13/2017   BILITOT 0.7 08/13/2017   ALKPHOS 49 08/13/2017   AST 22 08/13/2017   ALT 19 08/13/2017  .   Total Discharge time is about 33 minutes  Roxan Hockey M.D on 08/14/2017 at 1:54 PM  [facility]   Office  [phone]  Voice  Recognition Viviann Spare dictation system was used to create this note, attempts have been made to correct errors. Please contact the author with questions and/or clarifications.

## 2017-08-14 NOTE — Discharge Instructions (Signed)
1)You have iron deficiency anemia--- probably due to peptic ulcer disease with Ulcers and irritation of your stomach and duodenum [small bowel]  2)Avoid ibuprofen/Advil/Aleve/Motrin/Goody Powders/Naproxen/BC powders/Meloxicam/Excedrin Migraine/ diclofenac/Indomethacin and other Nonsteroidal anti-inflammatory medications as these will make you more likely to bleed and can cause stomach ulcers, can also cause Kidney problems.   3)Have your primary care physician refer you to gastroenterology for possible upper endoscopy and colonoscopy as part of work-up for your iron deficiency anemia  4)Stop Excedrin Migraine and other NSAIDs/nonsteroidal anti-inflammatory agents-  5)Take Protonix as prescribed for your presumed peptic ulcer disease with stomach and duodenal ulcers and irritation  6)We need to keep your LDL/bad cholesterol less than 70----Low-Fat/Low-Cholesterol Diet along with Lipitor 40 mg every evening advised  7)Repeat CBC/complete blood count Test in 1 to 2 weeks  8) take iron tablets as prescribed along with vitamin C for better absorption

## 2017-08-15 NOTE — Consult Note (Signed)
            [facility] CM Primary Care Navigator  08/15/2017  Jamie George January 21, 1946 [id number]   Attempt to seepatient at the bedsideto identify possible discharge needs but she was alreadydischargedhome per staff.   Per chart review, patient was admittedfor evaluation of chest pain (suspect GI etiology after further evaluation); Presumed Peptic Ulcer disease with iron deficiency anemia).  Primary care provider's officeis listed asprovidingtransition of care (TOC) follow-up.   For additional questions please contact:  Edwena Felty A. Frances Ambrosino, BSN, RN-BC [facility] PRIMARY CARE Navigator Cell: [phone]

## 2017-08-17 DIAGNOSIS — R413 Other amnesia: Secondary | ICD-10-CM | POA: Diagnosis not present

## 2017-08-17 DIAGNOSIS — F329 Major depressive disorder, single episode, unspecified: Secondary | ICD-10-CM | POA: Diagnosis not present

## 2017-08-17 DIAGNOSIS — D509 Iron deficiency anemia, unspecified: Secondary | ICD-10-CM | POA: Diagnosis not present

## 2017-08-17 DIAGNOSIS — R0789 Other chest pain: Secondary | ICD-10-CM | POA: Diagnosis not present

## 2017-08-17 DIAGNOSIS — E785 Hyperlipidemia, unspecified: Secondary | ICD-10-CM | POA: Diagnosis not present

## 2017-08-17 DIAGNOSIS — K279 Peptic ulcer, site unspecified, unspecified as acute or chronic, without hemorrhage or perforation: Secondary | ICD-10-CM | POA: Diagnosis not present

## 2017-08-17 DIAGNOSIS — G44229 Chronic tension-type headache, not intractable: Secondary | ICD-10-CM | POA: Diagnosis not present

## 2017-08-17 DIAGNOSIS — I7102 Dissection of abdominal aorta: Secondary | ICD-10-CM | POA: Diagnosis not present

## 2017-08-17 DIAGNOSIS — I10 Essential (primary) hypertension: Secondary | ICD-10-CM | POA: Diagnosis not present

## 2017-08-20 ENCOUNTER — Other Ambulatory Visit: Payer: Self-pay | Admitting: Family Medicine

## 2017-08-20 DIAGNOSIS — E041 Nontoxic single thyroid nodule: Secondary | ICD-10-CM

## 2017-08-24 ENCOUNTER — Encounter: Payer: Self-pay | Admitting: Cardiology

## 2017-08-24 DIAGNOSIS — I10 Essential (primary) hypertension: Secondary | ICD-10-CM | POA: Diagnosis not present

## 2017-08-24 DIAGNOSIS — K279 Peptic ulcer, site unspecified, unspecified as acute or chronic, without hemorrhage or perforation: Secondary | ICD-10-CM | POA: Diagnosis not present

## 2017-08-24 DIAGNOSIS — E785 Hyperlipidemia, unspecified: Secondary | ICD-10-CM | POA: Diagnosis not present

## 2017-08-24 DIAGNOSIS — I7102 Dissection of abdominal aorta: Secondary | ICD-10-CM | POA: Diagnosis not present

## 2017-08-24 DIAGNOSIS — R413 Other amnesia: Secondary | ICD-10-CM | POA: Diagnosis not present

## 2017-08-24 DIAGNOSIS — F329 Major depressive disorder, single episode, unspecified: Secondary | ICD-10-CM | POA: Diagnosis not present

## 2017-08-24 DIAGNOSIS — R0789 Other chest pain: Secondary | ICD-10-CM | POA: Diagnosis not present

## 2017-08-24 DIAGNOSIS — D509 Iron deficiency anemia, unspecified: Secondary | ICD-10-CM | POA: Diagnosis not present

## 2017-08-24 DIAGNOSIS — E041 Nontoxic single thyroid nodule: Secondary | ICD-10-CM | POA: Diagnosis not present

## 2017-09-03 ENCOUNTER — Ambulatory Visit
Admission: RE | Admit: 2017-09-03 | Discharge: 2017-09-03 | Disposition: A | Discharge status: Home / Self Care | Payer: Medicare PPO | Source: Ambulatory Visit | Attending: Family Medicine | Admitting: Family Medicine

## 2017-09-03 DIAGNOSIS — E041 Nontoxic single thyroid nodule: Secondary | ICD-10-CM

## 2017-09-03 DIAGNOSIS — E042 Nontoxic multinodular goiter: Secondary | ICD-10-CM | POA: Diagnosis not present

## 2017-09-19 DIAGNOSIS — R51 Headache: Secondary | ICD-10-CM | POA: Diagnosis not present

## 2017-10-12 DIAGNOSIS — I7102 Dissection of abdominal aorta: Secondary | ICD-10-CM | POA: Diagnosis not present

## 2017-10-12 DIAGNOSIS — E785 Hyperlipidemia, unspecified: Secondary | ICD-10-CM | POA: Diagnosis not present

## 2017-10-12 DIAGNOSIS — E041 Nontoxic single thyroid nodule: Secondary | ICD-10-CM | POA: Diagnosis not present

## 2017-10-12 DIAGNOSIS — D509 Iron deficiency anemia, unspecified: Secondary | ICD-10-CM | POA: Diagnosis not present

## 2017-10-12 DIAGNOSIS — F329 Major depressive disorder, single episode, unspecified: Secondary | ICD-10-CM | POA: Diagnosis not present

## 2017-10-12 DIAGNOSIS — K279 Peptic ulcer, site unspecified, unspecified as acute or chronic, without hemorrhage or perforation: Secondary | ICD-10-CM | POA: Diagnosis not present

## 2017-10-12 DIAGNOSIS — R413 Other amnesia: Secondary | ICD-10-CM | POA: Diagnosis not present

## 2017-10-12 DIAGNOSIS — G44229 Chronic tension-type headache, not intractable: Secondary | ICD-10-CM | POA: Diagnosis not present

## 2017-10-12 DIAGNOSIS — I10 Essential (primary) hypertension: Secondary | ICD-10-CM | POA: Diagnosis not present

## 2017-10-30 DIAGNOSIS — H2512 Age-related nuclear cataract, left eye: Secondary | ICD-10-CM | POA: Diagnosis not present

## 2017-10-30 DIAGNOSIS — H25013 Cortical age-related cataract, bilateral: Secondary | ICD-10-CM | POA: Diagnosis not present

## 2017-10-30 DIAGNOSIS — H25043 Posterior subcapsular polar age-related cataract, bilateral: Secondary | ICD-10-CM | POA: Diagnosis not present

## 2017-10-30 DIAGNOSIS — H2513 Age-related nuclear cataract, bilateral: Secondary | ICD-10-CM | POA: Diagnosis not present

## 2017-10-30 DIAGNOSIS — H02831 Dermatochalasis of right upper eyelid: Secondary | ICD-10-CM | POA: Diagnosis not present

## 2017-10-30 DIAGNOSIS — H18413 Arcus senilis, bilateral: Secondary | ICD-10-CM | POA: Diagnosis not present

## 2017-11-09 DIAGNOSIS — R413 Other amnesia: Secondary | ICD-10-CM | POA: Diagnosis not present

## 2017-11-09 DIAGNOSIS — K279 Peptic ulcer, site unspecified, unspecified as acute or chronic, without hemorrhage or perforation: Secondary | ICD-10-CM | POA: Diagnosis not present

## 2017-11-09 DIAGNOSIS — E041 Nontoxic single thyroid nodule: Secondary | ICD-10-CM | POA: Diagnosis not present

## 2017-11-09 DIAGNOSIS — I10 Essential (primary) hypertension: Secondary | ICD-10-CM | POA: Diagnosis not present

## 2017-11-09 DIAGNOSIS — I7102 Dissection of abdominal aorta: Secondary | ICD-10-CM | POA: Diagnosis not present

## 2017-11-09 DIAGNOSIS — F329 Major depressive disorder, single episode, unspecified: Secondary | ICD-10-CM | POA: Diagnosis not present

## 2017-11-09 DIAGNOSIS — D509 Iron deficiency anemia, unspecified: Secondary | ICD-10-CM | POA: Diagnosis not present

## 2017-11-09 DIAGNOSIS — E785 Hyperlipidemia, unspecified: Secondary | ICD-10-CM | POA: Diagnosis not present

## 2017-11-09 DIAGNOSIS — R0789 Other chest pain: Secondary | ICD-10-CM | POA: Diagnosis not present

## 2017-11-13 NOTE — Progress Notes (Signed)
Cardiology Office Note:    Date:  11/14/2017   ID:  Jamie George, DOB November 25, 1945, MRN [id number]  PCP:  Marda Stalker, PA-C  Cardiologist:  Buford Dresser, MD PhD  Referring MD: Marda Stalker, PA-C   Chief Complaint  Patient presents with  . Follow-up  . Headache    Extreme.    History of Present Illness:    Jamie George is a 72 y.o. female with a hx of abdominal aortic dissection, hypertension, dyslipidemia, chronic kidney disease who is seen as a new consult at the request of Marda Stalker, PA-C for the evaluation and management of dyslipidemia and hypertension. She was admitted for chest pain evaluation, discharged on 08/14/17. This was felt to be GI in nature, but she was noted to have small penetrating ulcer versus focal dissection in the infrarenal abdominal aorta.   Vascular surgery left recommendations on 08/13/17 note. Per Dr. Lianne Moris note, "I suspect the CTA findings are chronic as evident by the few images with a suggestion of thrombus in the dissection flap vs PAU. No immediate surgical interventions are needed. Would focus on maximal medical mgmt of atherosclerosis, as this should help managed both pathologies. Patient can follow up in the office in 6-12 months for further evaluation with interval aortic duplex."  Per records received from the [place] office at [facility], she had her amlodipine changed to verapamil to attempt to also manage headaches. She also had atorvastatin 40 mg stopped and was placed on livalo. However, on med rec today, they state that patient is on amlodipine and atorvastatin currently. The support person present today think that the atorvastatin was just recently restarted.  Patient concerns: headache today. No chest pain. No fevers, chills, shortness of breath. No other pain beside her headache.   Past Medical History:  Diagnosis Date  . Cancer ([place])    skin  . Complication of anesthesia    " difficult to wake me  up "  . Depression   . Duodenitis   . GERD (gastroesophageal reflux disease)   . Insomnia   . Migraines   . Mild dementia     Past Surgical History:  Procedure Laterality Date  . ABDOMINAL HYSTERECTOMY    . BACK SURGERY    . BRAIN SURGERY  2013   meningioma  . SHOULDER SURGERY      Current Medications: Current Outpatient Medications on File Prior to Visit  Medication Sig  . acetaminophen (TYLENOL) 325 MG tablet Take 2 tablets (650 mg total) by mouth every 6 (six) hours as needed for mild pain, fever or headache (or Fever >/= 101).  Marland Kitchen amLODipine (NORVASC) 5 MG tablet Take 1 tablet (5 mg total) by mouth daily. For BP  . aspirin EC 81 MG EC tablet Take 1 tablet (81 mg total) by mouth daily. With food  . atorvastatin (LIPITOR) 40 MG tablet Take 1 tablet (40 mg total) by mouth daily at 6 PM. For cholesterol  . escitalopram (LEXAPRO) 10 MG tablet Take 10 mg by mouth at bedtime.   . ferrous sulfate 325 (65 FE) MG EC tablet Take 1 tablet (325 mg total) by mouth 2 (two) times daily with a meal.  . MELATONIN-THEANINE PO Take 1-3 tablets by mouth at bedtime.  . ondansetron (ZOFRAN) 4 MG tablet Take 1 tablet (4 mg total) by mouth every 6 (six) hours as needed for nausea.  . pantoprazole (PROTONIX) 40 MG tablet Take 1 tablet (40 mg total) by mouth 2 (two) times daily before  a meal.  . traZODone (DESYREL) 50 MG tablet Take 50 mg by mouth at bedtime.    No current facility-administered medications on file prior to visit.      Allergies:   Patient has no known allergies.   Social History   Socioeconomic History  . Marital status: Widowed    Spouse name: Not on file  . Number of children: Not on file  . Years of education: Not on file  . Highest education level: Not on file  Occupational History  . Not on file  Social Needs  . Financial resource strain: Not on file  . Food insecurity:    Worry: Not on file    Inability: Not on file  . Transportation needs:    Medical: Not on file      Non-medical: Not on file  Tobacco Use  . Smoking status: Former Research scientist (life sciences)  . Smokeless tobacco: Former Network engineer and Sexual Activity  . Alcohol use: Not Currently  . Drug use: Never  . Sexual activity: Not Currently  Lifestyle  . Physical activity:    Days per week: Not on file    Minutes per session: Not on file  . Stress: Not on file  Relationships  . Social connections:    Talks on phone: Not on file    Gets together: Not on file    Attends religious service: Not on file    Active member of club or organization: Not on file    Attends meetings of clubs or organizations: Not on file    Relationship status: Not on file  Other Topics Concern  . Not on file  Social History Narrative   Lives alone in home.  Retired.  Widowed.  Children 2 (daughter, [name]).  Caffeine some.      Family History: The patient's family history includes Cancer in her mother; Heart disease in her father; Stroke in her father.  ROS:   Please see the history of present illness.  Additional pertinent ROS: Review of Systems  Constitutional: Positive for malaise/fatigue. Negative for chills and fever.  HENT: Negative for ear pain and hearing loss.   Eyes: Negative for blurred vision and pain.  Respiratory: Negative for cough and shortness of breath.   Cardiovascular: Negative for chest pain, palpitations, orthopnea, claudication, leg swelling and PND.  Gastrointestinal: Negative for abdominal pain, blood in stool and melena.  Genitourinary: Negative for dysuria and hematuria.  Musculoskeletal: Negative for falls and joint pain.  Skin: Negative for rash.  Neurological: Positive for headaches. Negative for loss of consciousness.  Endo/Heme/Allergies: Does not bruise/bleed easily.   EKGs/Labs/Other Studies Reviewed:    The following studies were reviewed today: Recent notes, labs, imaging studies  Echo 08/13/17 - Left ventricle: The cavity size was normal. There was mild   concentric  hypertrophy. Systolic function was normal. The   estimated ejection fraction was in the range of 55% to 60%. Wall   motion was normal; there were no regional wall motion   abnormalities. Doppler parameters are consistent with abnormal   left ventricular relaxation (grade 1 diastolic dysfunction).   Doppler parameters are consistent with elevated mean left atrial   filling pressure. - Mitral valve: There was mild to moderate regurgitation directed   centrally. - Left atrium: The atrium was mildly dilated.  CTA 08/13/17 VASCULAR  Aorta: Atherosclerosis of abdominal aorta is noted without aneurysm formation. Probable small dissection is seen in the infrarenal portion.  EKG:  EKG is ordered today.  The ekg ordered today demonstrates normal sinus rhythm  Recent Labs: 08/13/2017: ALT 19; TSH 1.627 08/14/2017: BUN 10; Creatinine, Ser 1.03; Hemoglobin 9.1; Platelets 425; Potassium 3.8; Sodium 141  Recent Lipid Panel    Component Value Date/Time   CHOL 216 (H) 08/13/2017 1553   TRIG 102 08/13/2017 1553   HDL 59 08/13/2017 1553   CHOLHDL 3.7 08/13/2017 1553   VLDL 20 08/13/2017 1553   LDLCALC 137 (H) 08/13/2017 1553    Physical Exam:    VS:  BP 106/66 (BP Location: Left Arm, Patient Position: Sitting, Cuff Size: Normal)   Pulse 73   Ht 5\' 6"  (1.676 m)   Wt 129 lb (58.5 kg)   BMI 20.82 kg/m     Wt Readings from Last 3 Encounters:  11/14/17 129 lb (58.5 kg)  08/13/17 107 lb (48.5 kg)  07/06/17 107 lb 3.2 oz (48.6 kg)     GEN: appears uncomfortable, lying on exam table, wants to cover her head/eyes HEENT: Normal NECK: No JVD; No carotid bruits LYMPHATICS: No lymphadenopathy CARDIAC: regular rhythm, normal S1 and S2, no rubs, gallops. 2/6 holosystolic murmur Radial and DP pulses 2+ bilaterally. RESPIRATORY:  Clear to auscultation without rales, wheezing or rhonchi  ABDOMEN: Soft, non-tender, non-distended MUSCULOSKELETAL:  No edema; No deformity  SKIN: Warm and  dry NEUROLOGIC:  Alert and oriented x 3 PSYCHIATRIC:  Normal affect   ASSESSMENT:    1. Dissection of abdominal aorta ([place])   2. Essential hypertension   3. Chronic intractable headache, unspecified headache type   4. Mixed hyperlipidemia    PLAN:    1. Small focal dissection vs. Ulcer of abdominal aorta: following with vascular surgery. Main need is for risk factor modification  -Hypertension: well controlled today. Continue amlodipine. Would consider beta blocker but unclear how that would affect her headaches, which appear severe and intractable today  -Hyperlipidemia: most recent LDL 124. Goal <70. Per discussion today, she just restarted this medication, and therefore it may not be at peak effect. Would make sure it has been at least 4-6 weeks on continuous therapy and recheck lipid profile. If not at goal of LDL<70, would consider increasing atorvastatin dose to 80 mg. On review of notes, appears she may have had issues with statins in the past. I am not clear on what the side effect was. If we cannot get her to LDL<70, may need to refer her to the lipid clinic for additional therapy  -on aspirin 81 mg  2. Intractable headaches: this is her main concern today. She has a pending appointment with the headache clinic for this.   Plan for follow up: 1 year for risk factor management or sooner as needed  Medication Adjustments/Labs and Tests Ordered: Current medicines are reviewed at length with the patient today.  Concerns regarding medicines are outlined above.  No orders of the defined types were placed in this encounter.  No orders of the defined types were placed in this encounter.   Patient Instructions  Medication Instructions:  Your physician recommends that you continue on your current medications as directed. Please refer to the Current Medication list given to you today.  Labwork: NONE  Testing/Procedures: NONE  Follow-Up: Your physician wants you to follow-up in:  1 YEAR  You will receive a reminder letter in the mail two months in advance. If you don't receive a letter, please call our office to schedule the follow-up appointment.       Signed, Buford Dresser, MD PhD 11/14/2017 4:45  PM    [facility]

## 2017-11-14 ENCOUNTER — Ambulatory Visit (INDEPENDENT_AMBULATORY_CARE_PROVIDER_SITE_OTHER): Payer: Medicare PPO | Admitting: Cardiology

## 2017-11-14 ENCOUNTER — Encounter: Payer: Self-pay | Admitting: Cardiology

## 2017-11-14 VITALS — BP 106/66 | HR 73 | Ht 66.0 in | Wt 129.0 lb

## 2017-11-14 DIAGNOSIS — I7102 Dissection of abdominal aorta: Secondary | ICD-10-CM | POA: Diagnosis not present

## 2017-11-14 DIAGNOSIS — E782 Mixed hyperlipidemia: Secondary | ICD-10-CM | POA: Diagnosis not present

## 2017-11-14 DIAGNOSIS — I10 Essential (primary) hypertension: Secondary | ICD-10-CM | POA: Diagnosis not present

## 2017-11-14 DIAGNOSIS — G8929 Other chronic pain: Secondary | ICD-10-CM

## 2017-11-14 DIAGNOSIS — R51 Headache: Secondary | ICD-10-CM | POA: Diagnosis not present

## 2017-11-14 NOTE — Patient Instructions (Signed)
Medication Instructions:  Your physician recommends that you continue on your current medications as directed. Please refer to the Current Medication list given to you today.  Labwork: NONE  Testing/Procedures: NONE  Follow-Up: Your physician wants you to follow-up in: 1 YEAR  You will receive a reminder letter in the mail two months in advance. If you don't receive a letter, please call our office to schedule the follow-up appointment.

## 2017-11-16 ENCOUNTER — Emergency Department (HOSPITAL_COMMUNITY)
Admission: EM | Admit: 2017-11-16 | Discharge: 2017-11-17 | Disposition: A | Discharge status: Home / Self Care | Payer: Medicare PPO | Attending: Emergency Medicine | Admitting: Emergency Medicine

## 2017-11-16 ENCOUNTER — Other Ambulatory Visit: Payer: Self-pay

## 2017-11-16 ENCOUNTER — Encounter (HOSPITAL_COMMUNITY): Payer: Self-pay | Admitting: Emergency Medicine

## 2017-11-16 DIAGNOSIS — Z7982 Long term (current) use of aspirin: Secondary | ICD-10-CM | POA: Diagnosis not present

## 2017-11-16 DIAGNOSIS — R519 Headache, unspecified: Secondary | ICD-10-CM

## 2017-11-16 DIAGNOSIS — Z87891 Personal history of nicotine dependence: Secondary | ICD-10-CM | POA: Insufficient documentation

## 2017-11-16 DIAGNOSIS — R51 Headache: Secondary | ICD-10-CM | POA: Diagnosis not present

## 2017-11-16 DIAGNOSIS — F0390 Unspecified dementia without behavioral disturbance: Secondary | ICD-10-CM | POA: Diagnosis not present

## 2017-11-16 DIAGNOSIS — Z79899 Other long term (current) drug therapy: Secondary | ICD-10-CM | POA: Insufficient documentation

## 2017-11-16 DIAGNOSIS — N189 Chronic kidney disease, unspecified: Secondary | ICD-10-CM | POA: Diagnosis not present

## 2017-11-16 DIAGNOSIS — I129 Hypertensive chronic kidney disease with stage 1 through stage 4 chronic kidney disease, or unspecified chronic kidney disease: Secondary | ICD-10-CM | POA: Diagnosis not present

## 2017-11-16 DIAGNOSIS — H538 Other visual disturbances: Secondary | ICD-10-CM | POA: Diagnosis not present

## 2017-11-16 LAB — I-STAT CHEM 8, ED
BUN: 17 mg/dL (ref 8–23)
CALCIUM ION: 1.16 mmol/L (ref 1.15–1.40)
CREATININE: 1 mg/dL (ref 0.44–1.00)
Chloride: 102 mmol/L (ref 98–111)
GLUCOSE: 123 mg/dL — AB (ref 70–99)
HCT: 47 % — ABNORMAL HIGH (ref 36.0–46.0)
HEMOGLOBIN: 16 g/dL — AB (ref 12.0–15.0)
Potassium: 3.9 mmol/L (ref 3.5–5.1)
Sodium: 140 mmol/L (ref 135–145)
TCO2: 28 mmol/L (ref 22–32)

## 2017-11-16 MED ORDER — KETOROLAC TROMETHAMINE 30 MG/ML IJ SOLN
15.0000 mg | Freq: Once | INTRAMUSCULAR | Status: AC
  Administered 2017-11-16: 15 mg via INTRAVENOUS
  Filled 2017-11-16: qty 1

## 2017-11-16 NOTE — ED Provider Notes (Signed)
Patient placed in Quick Look pathway, seen and evaluated   Chief Complaint: headache, eye pain  HPI: Jamie George is a 72 y.o. female who presents to the ED with right eye pain and headache. Patient reports symptoms started 2 months ago and patient went to her PCP. Patient was given medication for pain but it is not helping. Patient reports now the eye pain and headache are severe and she has been unable to sleep due to the pain.  ROS: Neuro: headache  Eyes: pain right  Physical Exam:  BP (!) 159/100 (BP Location: Right Arm)   Pulse 84   Temp 98.5 F (36.9 C) (Oral)   Resp 18   SpO2 98%    Gen: No distress  Neuro: Awake and Alert  Skin: Warm and dry  Eyes: right eye orbit tender with palpation  Initiation of care has begun. The patient has been counseled on the process, plan, and necessity for staying for the completion/evaluation, and the remainder of the medical screening examination    Ashley Murrain, NP 11/16/17 8099    Varney Biles, MD 11/16/17 2340

## 2017-11-16 NOTE — ED Triage Notes (Signed)
C/o constant pain above R eye x 4-5 weeks.  States Tylenol, Advil, and Tramadol have not helped symptoms.  Also reports blurred vision to R eye but states it may be because glasses need updating.  Scheduled for cataract surgery on L eye on Monday.  Scheduled for appt with headache clinic on 10/7.

## 2017-11-16 NOTE — ED Provider Notes (Signed)
[facility] EMERGENCY DEPARTMENT Provider Note   CSN: [id number] Arrival date & time: 11/16/17  1901     History   Chief Complaint Chief Complaint  Patient presents with  . pain above R eye    HPI Jamie George is a 72 y.o. female.  The history is provided by the patient and medical records. No language interpreter was used.   Jamie George is a 72 y.o. female  with a PMH of migraines who presents to the Emergency Department complaining of persistent headache just below her right eye brow.  States that she has intermittently had these headaches for about 5 years, however daily constant headaches to this area began about 2 months ago.  She was seen by her primary care doctor who gave her a shot which did not help.  She then was given a prescription for tramadol which also has made no improvement. She has an appointment with the headache clinic on 10/07. She has had no new acute visual changes, but does state that her vision has gradually become more blurry. She is getting new glasses and also has cataract surgeries scheduled (left eye on Monday, right eye will be scheduled later). No neck pain, fever, chills, numbness, weakness. Pain occurs daily, constant for the last 2 months.   Past Medical History:  Diagnosis Date  . Cancer ([place])    skin  . Complication of anesthesia    " difficult to wake me up "  . Depression   . Duodenitis   . GERD (gastroesophageal reflux disease)   . Insomnia   . Migraines   . Mild dementia     Patient Active Problem List   Diagnosis Date Noted  . Dissection of abdominal aorta ([place]) 08/13/2017  . Chest pain 08/13/2017  . Hypertension 08/13/2017  . CKD (chronic kidney disease) 08/13/2017  . Anemia 08/13/2017    Past Surgical History:  Procedure Laterality Date  . ABDOMINAL HYSTERECTOMY    . BACK SURGERY    . BRAIN SURGERY  2013   meningioma  . SHOULDER SURGERY       OB History   None      Home  Medications    Prior to Admission medications   Medication Sig Start Date End Date Taking? Authorizing Provider  acetaminophen (TYLENOL) 325 MG tablet Take 2 tablets (650 mg total) by mouth every 6 (six) hours as needed for mild pain, fever or headache (or Fever >/= 101). 08/14/17   Roxan Hockey, MD  amLODipine (NORVASC) 5 MG tablet Take 1 tablet (5 mg total) by mouth daily. For BP 08/15/17   Emokpae, Courage, MD  aspirin EC 81 MG EC tablet Take 1 tablet (81 mg total) by mouth daily. With food 08/15/17   Roxan Hockey, MD  atorvastatin (LIPITOR) 40 MG tablet Take 1 tablet (40 mg total) by mouth daily at 6 PM. For cholesterol 08/14/17   Emokpae, Courage, MD  escitalopram (LEXAPRO) 10 MG tablet Take 10 mg by mouth at bedtime.  05/03/17   [provider]  ferrous sulfate 325 (65 FE) MG EC tablet Take 1 tablet (325 mg total) by mouth 2 (two) times daily with a meal. 08/14/17   Emokpae, Courage, MD  MELATONIN-THEANINE PO Take 1-3 tablets by mouth at bedtime.    [provider]  ondansetron (ZOFRAN) 4 MG tablet Take 1 tablet (4 mg total) by mouth every 6 (six) hours as needed for nausea. 08/14/17   Roxan Hockey, MD  pantoprazole (  PROTONIX) 40 MG tablet Take 1 tablet (40 mg total) by mouth 2 (two) times daily before a meal. 08/14/17 08/14/18  Roxan Hockey, MD  traZODone (DESYREL) 50 MG tablet Take 50 mg by mouth at bedtime.  05/03/17   [provider]    Family History Family History  Problem Relation Age of Onset  . Cancer Mother   . Stroke Father   . Heart disease Father     Social History Social History   Tobacco Use  . Smoking status: Former Research scientist (life sciences)  . Smokeless tobacco: Former Network engineer Use Topics  . Alcohol use: Not Currently  . Drug use: Never     Allergies   Patient has no known allergies.   Review of Systems Review of Systems  Eyes: Positive for pain.  Neurological: Positive for headaches. Negative for dizziness, syncope, facial  asymmetry, speech difficulty, weakness, light-headedness and numbness.  All other systems reviewed and are negative.    Physical Exam Updated Vital Signs BP (!) 149/68 (BP Location: Left Arm)   Pulse 77   Temp 98.9 F (37.2 C) (Oral)   Resp 15   SpO2 97%   Physical Exam  Constitutional: She is oriented to person, place, and time. She appears well-developed and well-nourished. No distress.  HENT:  Head: Normocephalic and atraumatic.  Mouth/Throat: Oropharynx is clear and moist.  No tenderness of the temporal artery   Eyes: Pupils are equal, round, and reactive to light. Conjunctivae and EOM are normal. No scleral icterus.    Tenderness to palpation as depicted in image. No nystagmus.  Neck: Normal range of motion. Neck supple.  Full active and passive ROM without pain.  No midline or paraspinal tenderness. No nuchal rigidity or meningeal signs.  Cardiovascular: Normal rate, regular rhythm, normal heart sounds and intact distal pulses.  Pulmonary/Chest: Effort normal and breath sounds normal. No respiratory distress. She has no wheezes. She has no rales.  Abdominal: Soft. Bowel sounds are normal. She exhibits no distension. There is no tenderness. There is no rebound and no guarding.  Musculoskeletal: Normal range of motion.  Lymphadenopathy:    She has no cervical adenopathy.  Neurological: She is alert and oriented to person, place, and time. She has normal reflexes. No cranial nerve deficit. Coordination normal.  Alert, oriented, thought content appropriate, able to give a coherent history. Speech is clear and goal oriented, able to follow commands.  Cranial Nerves:  II:  Peripheral visual fields grossly normal, pupils equal, round, reactive to light III, IV, VI: EOM intact bilaterally, ptosis not present V,VII: smile symmetric, eyes kept closed tightly against resistance, facial light touch sensation equal VIII: hearing grossly normal IX, X: symmetric soft palate movement,  uvula elevates symmetrically  XI: bilateral shoulder shrug symmetric and strong XII: midline tongue extension 5/5 muscle strength in upper and lower extremities bilaterally including strong and equal grip strength and dorsiflexion/plantar flexion Sensory to light touch normal in all four extremities.  Normal finger-to-nose and rapid alternating movements. No drift. Steady gait.  Skin: Skin is warm and dry. No rash noted. She is not diaphoretic.  Nursing note and vitals reviewed.    ED Treatments / Results  Labs (all labs ordered are listed, but only abnormal results are displayed) Labs Reviewed  I-STAT CHEM 8, ED - Abnormal; Notable for the following components:      Result Value   Glucose, Bld 123 (*)    Hemoglobin 16.0 (*)    HCT 47.0 (*)    All  other components within normal limits    EKG None  Radiology Ct Head Wo Contrast  Result Date: 11/17/2017 CLINICAL DATA:  Constant RIGHT eye pain for 5 weeks, blurry vision. Headache. History of migraines, mild dementia and meningioma resection. EXAM: CT HEAD WITHOUT CONTRAST CT ORBITS WITH CONTRAST TECHNIQUE: Multidetector CT imaging of the head was performed following the standard protocol without intravenous contrast. Multidetector CT imaging of the orbits were performed following the standard protocol during bolus administration of intravenous contrast. CONTRAST:  3mL OMNIPAQUE IOHEXOL 300 MG/ML  SOLN COMPARISON:  MRI head August 05, 2017 FINDINGS: CT HEAD FINDINGS BRAIN: No intraparenchymal hemorrhage, mass effect nor midline shift. The ventricles and sulci are normal for age. Patchy supratentorial white matter hypodensities within normal range for patient's age, though non-specific are most compatible with chronic small vessel ischemic disease. Small area LEFT frontal encephalomalacia stable from prior imaging. Old RIGHT basal ganglia cystic infarct. Old tiny LEFT basal ganglia lacunar infarct. Old small RIGHT cerebellar infarcts. No acute  large vascular territory infarcts. No abnormal extra-axial fluid collections. Basal cisterns are patent. VASCULAR: Trace calcific atherosclerosis of the carotid siphons. SKULL: No skull fracture. Status post LEFT frontal craniotomy. No significant scalp soft tissue swelling. OTHER: None. CT ORBITS FINDINGS ORBITS: Intact ocular globes. Lenses are located. Normal appearance of the optic nerve sheath complexes. Preservation of the orbital fat. Normal appearance of the extraocular muscles which are well located. Superior ophthalmic veins are not enlarged. VISUALIZED SINUSES: Well aerated paranasal sinus and mastoid air cells. SOFT TISSUES/BONES: No significant soft tissue swelling, no subcutaneous gas or radiopaque foreign bodies. No destructive bony lesions. IMPRESSION: CT HEAD: 1. No acute intracranial process. 2. Status post LEFT frontal craniotomy for meningioma resection with similar LEFT frontal encephalomalacia. 3. 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 Mild chronic small vessel ischemic changes. CT ORBITS: 1. Normal contrast enhanced CT orbits. Electronically Signed   By: Elon Alas M.D.   On: 11/17/2017 00:33   Ct Orbits W Contrast  Result Date: 11/17/2017 CLINICAL DATA:  Constant RIGHT eye pain for 5 weeks, blurry vision. Headache. History of migraines, mild dementia and meningioma resection. EXAM: CT HEAD WITHOUT CONTRAST CT ORBITS WITH CONTRAST TECHNIQUE: Multidetector CT imaging of the head was performed following the standard protocol without intravenous contrast. Multidetector CT imaging of the orbits were performed following the standard protocol during bolus administration of intravenous contrast. CONTRAST:  92mL OMNIPAQUE IOHEXOL 300 MG/ML  SOLN COMPARISON:  MRI head August 05, 2017 FINDINGS: CT HEAD FINDINGS BRAIN: No intraparenchymal hemorrhage, mass effect nor midline shift. The ventricles and sulci are normal for age. Patchy supratentorial white matter hypodensities within  normal range for patient's age, though non-specific are most compatible with chronic small vessel ischemic disease. Small area LEFT frontal encephalomalacia stable from prior imaging. Old RIGHT basal ganglia cystic infarct. Old tiny LEFT basal ganglia lacunar infarct. Old small RIGHT cerebellar infarcts. No acute large vascular territory infarcts. No abnormal extra-axial fluid collections. Basal cisterns are patent. VASCULAR: Trace calcific atherosclerosis of the carotid siphons. SKULL: No skull fracture. Status post LEFT frontal craniotomy. No significant scalp soft tissue swelling. OTHER: None. CT ORBITS FINDINGS ORBITS: Intact ocular globes. Lenses are located. Normal appearance of the optic nerve sheath complexes. Preservation of the orbital fat. Normal appearance of the extraocular muscles which are well located. Superior ophthalmic veins are not enlarged. VISUALIZED SINUSES: Well aerated paranasal sinus and mastoid air cells. SOFT TISSUES/BONES: No significant soft tissue swelling, no subcutaneous gas or radiopaque foreign bodies.  No destructive bony lesions. IMPRESSION: CT HEAD: 1. No acute intracranial process. 2. Status post LEFT frontal craniotomy for meningioma resection with similar LEFT frontal encephalomalacia. 3. 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 Mild chronic small vessel ischemic changes. CT ORBITS: 1. Normal contrast enhanced CT orbits. Electronically Signed   By: Elon Alas M.D.   On: 11/17/2017 00:33    Procedures Procedures (including critical care time)  Medications Ordered in ED Medications  morphine 4 MG/ML injection 4 mg (has no administration in time range)  ketorolac (TORADOL) 30 MG/ML injection 15 mg (15 mg Intravenous Given 11/16/17 2344)  iohexol (OMNIPAQUE) 300 MG/ML solution 75 mL (75 mLs Intravenous Contrast Given 11/17/17 0003)     Initial Impression / Assessment and Plan / ED Course  I have reviewed the triage vital signs and the nursing  notes.  Pertinent labs & imaging results that were available during my care of the patient were reviewed by me and considered in my medical decision making (see chart for details).    ATHIRA JANOWICZ is a 72 y.o. female who presents to ED for constant, persistent right eye pain and headache for the last 2 months. No focal neuro deficits on exam. Pain is reproducible with palpation just below right eyebrow. CT head and orbits with no acute findings. Evaluation does not show pathology that would require ongoing emergent intervention or inpatient treatment. She has follow up with headache clinic for 1st appointment with them on 10/07 -- encouraged to keep this appointment. Will discharge home with short course of pain medication.    Patient seen by and discussed with Dr. Leonides Schanz who agrees with treatment plan.    Final Clinical Impressions(s) / ED Diagnoses   Final diagnoses:  Chronic right-sided headache    ED Discharge Orders    None       Jamie George, Jamie Almond, Jamie George 11/17/17 0041    Jamie George, Jamie Bison, Jamie George 11/17/17 [id number]

## 2017-11-17 ENCOUNTER — Other Ambulatory Visit (HOSPITAL_COMMUNITY): Payer: Medicare PPO

## 2017-11-17 ENCOUNTER — Emergency Department (HOSPITAL_COMMUNITY): Payer: Medicare PPO

## 2017-11-17 DIAGNOSIS — H538 Other visual disturbances: Secondary | ICD-10-CM | POA: Diagnosis not present

## 2017-11-17 DIAGNOSIS — R51 Headache: Secondary | ICD-10-CM | POA: Diagnosis not present

## 2017-11-17 MED ORDER — MORPHINE SULFATE (PF) 4 MG/ML IV SOLN
4.0000 mg | Freq: Once | INTRAVENOUS | Status: AC
  Administered 2017-11-17: 4 mg via INTRAVENOUS
  Filled 2017-11-17: qty 1

## 2017-11-17 MED ORDER — IOHEXOL 300 MG/ML  SOLN
75.0000 mL | Freq: Once | INTRAMUSCULAR | Status: AC | PRN
  Administered 2017-11-17: 75 mL via INTRAVENOUS

## 2017-11-17 MED ORDER — HYDROCODONE-ACETAMINOPHEN 5-325 MG PO TABS: ORAL_TABLET | ORAL | 0 refills | Status: DC

## 2017-11-17 NOTE — Discharge Instructions (Addendum)
It was my pleasure taking care of you today!   Take pain medication prescribed to you as directed. This is to be taken INSTEAD OF your tramadol. Do not take both.   Please keep your appointment with the headache clinic.   Return to ER for new or worsening symptoms, any additional concerns.

## 2017-11-19 DIAGNOSIS — H2512 Age-related nuclear cataract, left eye: Secondary | ICD-10-CM | POA: Diagnosis not present

## 2017-11-20 DIAGNOSIS — H2511 Age-related nuclear cataract, right eye: Secondary | ICD-10-CM | POA: Diagnosis not present

## 2017-11-26 DIAGNOSIS — Z79899 Other long term (current) drug therapy: Secondary | ICD-10-CM | POA: Diagnosis not present

## 2017-11-26 DIAGNOSIS — R51 Headache: Secondary | ICD-10-CM | POA: Diagnosis not present

## 2017-11-26 DIAGNOSIS — Z049 Encounter for examination and observation for unspecified reason: Secondary | ICD-10-CM | POA: Diagnosis not present

## 2017-11-29 DIAGNOSIS — M542 Cervicalgia: Secondary | ICD-10-CM | POA: Diagnosis not present

## 2017-11-29 DIAGNOSIS — M791 Myalgia, unspecified site: Secondary | ICD-10-CM | POA: Diagnosis not present

## 2017-11-29 DIAGNOSIS — R51 Headache: Secondary | ICD-10-CM | POA: Diagnosis not present

## 2017-12-06 ENCOUNTER — Other Ambulatory Visit: Payer: Self-pay | Admitting: Gastroenterology

## 2017-12-06 DIAGNOSIS — R9389 Abnormal findings on diagnostic imaging of other specified body structures: Secondary | ICD-10-CM | POA: Diagnosis not present

## 2017-12-06 DIAGNOSIS — D500 Iron deficiency anemia secondary to blood loss (chronic): Secondary | ICD-10-CM | POA: Diagnosis not present

## 2017-12-06 DIAGNOSIS — R195 Other fecal abnormalities: Secondary | ICD-10-CM | POA: Diagnosis not present

## 2017-12-06 DIAGNOSIS — K219 Gastro-esophageal reflux disease without esophagitis: Secondary | ICD-10-CM | POA: Diagnosis not present

## 2017-12-06 DIAGNOSIS — Z791 Long term (current) use of non-steroidal anti-inflammatories (NSAID): Secondary | ICD-10-CM | POA: Diagnosis not present

## 2017-12-14 ENCOUNTER — Emergency Department (HOSPITAL_COMMUNITY)
Admission: EM | Admit: 2017-12-14 | Discharge: 2017-12-14 | Disposition: A | Discharge status: Home / Self Care | Payer: Medicare PPO | Attending: Emergency Medicine | Admitting: Emergency Medicine

## 2017-12-14 ENCOUNTER — Encounter (HOSPITAL_COMMUNITY): Payer: Self-pay | Admitting: Emergency Medicine

## 2017-12-14 ENCOUNTER — Other Ambulatory Visit: Payer: Self-pay

## 2017-12-14 ENCOUNTER — Emergency Department (HOSPITAL_COMMUNITY): Payer: Medicare PPO

## 2017-12-14 DIAGNOSIS — Z7982 Long term (current) use of aspirin: Secondary | ICD-10-CM | POA: Insufficient documentation

## 2017-12-14 DIAGNOSIS — I129 Hypertensive chronic kidney disease with stage 1 through stage 4 chronic kidney disease, or unspecified chronic kidney disease: Secondary | ICD-10-CM | POA: Insufficient documentation

## 2017-12-14 DIAGNOSIS — R0689 Other abnormalities of breathing: Secondary | ICD-10-CM | POA: Diagnosis not present

## 2017-12-14 DIAGNOSIS — N189 Chronic kidney disease, unspecified: Secondary | ICD-10-CM | POA: Diagnosis not present

## 2017-12-14 DIAGNOSIS — Z87891 Personal history of nicotine dependence: Secondary | ICD-10-CM | POA: Diagnosis not present

## 2017-12-14 DIAGNOSIS — Z79899 Other long term (current) drug therapy: Secondary | ICD-10-CM | POA: Diagnosis not present

## 2017-12-14 DIAGNOSIS — I10 Essential (primary) hypertension: Secondary | ICD-10-CM | POA: Diagnosis not present

## 2017-12-14 DIAGNOSIS — R0602 Shortness of breath: Secondary | ICD-10-CM

## 2017-12-14 DIAGNOSIS — F419 Anxiety disorder, unspecified: Secondary | ICD-10-CM | POA: Diagnosis present

## 2017-12-14 DIAGNOSIS — G479 Sleep disorder, unspecified: Secondary | ICD-10-CM | POA: Diagnosis not present

## 2017-12-14 DIAGNOSIS — R064 Hyperventilation: Secondary | ICD-10-CM | POA: Diagnosis not present

## 2017-12-14 DIAGNOSIS — R457 State of emotional shock and stress, unspecified: Secondary | ICD-10-CM | POA: Diagnosis not present

## 2017-12-14 LAB — ETHANOL: Alcohol, Ethyl (B): <10 mg/dL (ref <10)

## 2017-12-14 LAB — COMPREHENSIVE METABOLIC PANEL
ALBUMIN: 3.7 g/dL (ref 3.5–5.0)
ALK PHOS: 63 U/L (ref 38–126)
ALT: 14 U/L (ref 0–44)
ANION GAP: 8 (ref 5–15)
AST: 17 U/L (ref 15–41)
BUN: 16 mg/dL (ref 8–23)
CALCIUM: 9.4 mg/dL (ref 8.9–10.3)
CHLORIDE: 106 mmol/L (ref 98–111)
CO2: 28 mmol/L (ref 22–32)
CREATININE: 1.24 mg/dL — AB (ref 0.44–1.00)
GFR calc non Af Amer: 42 mL/min — ABNORMAL LOW (ref >60)
GFR, EST AFRICAN AMERICAN: 49 mL/min — AB (ref >60)
GLUCOSE: 94 mg/dL (ref 70–99)
Potassium: 3.8 mmol/L (ref 3.5–5.1)
SODIUM: 142 mmol/L (ref 135–145)
Total Bilirubin: 0.4 mg/dL (ref 0.3–1.2)
Total Protein: 6.3 g/dL — ABNORMAL LOW (ref 6.5–8.1)

## 2017-12-14 LAB — CBC WITH DIFFERENTIAL/PLATELET
ABS IMMATURE GRANULOCYTES: 0.05 10*3/uL (ref 0.00–0.07)
BASOS ABS: 0.1 10*3/uL (ref 0.0–0.1)
BASOS PCT: 1 %
Eosinophils Absolute: 0.2 10*3/uL (ref 0.0–0.5)
Eosinophils Relative: 2 %
HCT: 48.9 % — ABNORMAL HIGH (ref 36.0–46.0)
HEMOGLOBIN: 15 g/dL (ref 12.0–15.0)
Immature Granulocytes: 1 %
LYMPHS PCT: 12 %
Lymphs Abs: 1.2 10*3/uL (ref 0.7–4.0)
MCH: 27.7 pg (ref 26.0–34.0)
MCHC: 30.7 g/dL (ref 30.0–36.0)
MCV: 90.4 fL (ref 80.0–100.0)
MONO ABS: 0.8 10*3/uL (ref 0.1–1.0)
Monocytes Relative: 7 %
NEUTROS ABS: 8 10*3/uL — AB (ref 1.7–7.7)
Neutrophils Relative %: 77 %
PLATELETS: 327 10*3/uL (ref 150–400)
RBC: 5.41 MIL/uL — AB (ref 3.87–5.11)
RDW: 16.4 % — ABNORMAL HIGH (ref 11.5–15.5)
WBC: 10.4 10*3/uL (ref 4.0–10.5)
nRBC: 0 % (ref 0.0–0.2)

## 2017-12-14 LAB — RAPID URINE DRUG SCREEN, HOSP PERFORMED
AMPHETAMINES: NOT DETECTED
BARBITURATES: NOT DETECTED
Benzodiazepines: NOT DETECTED
COCAINE: NOT DETECTED
OPIATES: NOT DETECTED
TETRAHYDROCANNABINOL: NOT DETECTED

## 2017-12-14 LAB — TROPONIN I: Troponin I: <0.03 ng/mL (ref <0.03)

## 2017-12-14 MED ORDER — AMLODIPINE BESYLATE 5 MG PO TABS
5.0000 mg | ORAL_TABLET | Freq: Once | ORAL | Status: DC
  Filled 2017-12-14: qty 1

## 2017-12-14 MED ORDER — SODIUM CHLORIDE 0.9 % IV SOLN
INTRAVENOUS | Status: DC
  Administered 2017-12-14: 08:00:00 via INTRAVENOUS

## 2017-12-14 MED ORDER — VERAPAMIL HCL 40 MG PO TABS
20.0000 mg | ORAL_TABLET | Freq: Once | ORAL | Status: AC
  Administered 2017-12-14: 20 mg via ORAL
  Filled 2017-12-14: qty 1

## 2017-12-14 NOTE — ED Notes (Signed)
Per pt and pt daughter. Pt takes verapamil 20mg  PO

## 2017-12-14 NOTE — ED Notes (Signed)
Pt stating multiple times " I just quit breathing. Are you just going to let me lay here and die? I quit breathing" Pt informed that she was awake and talking with this nurse and therefore breathing on her own. Pt also informed that her O2 sat was 98% on room air.

## 2017-12-14 NOTE — ED Notes (Signed)
Family at bedside. 

## 2017-12-14 NOTE — ED Provider Notes (Signed)
[facility] DEPT Provider Note   CSN: [id number] Arrival date & time: 12/14/17  7001     History   Chief Complaint Chief Complaint  Patient presents with  . Anxiety    HPI Jamie George is a 72 y.o. female.  72 year old female with history of depression who presents with 3 to 4-week history of trouble trying to stay awake.  States that this occurs mostly at night and that she feels as if she falls asleep she will stop breathing.  Denies any history of sleep apnea.  She denies being anxious.  Patient denies any hallucinations.  Denies any SI or HI.  No associated chest pain or cough or congestion with this.  Denies any use of alcohol or new drugs.  No prior history of same.  EMS was called and patient transported here for evaluation.     Past Medical History:  Diagnosis Date  . Cancer ([place])    skin  . Complication of anesthesia    " difficult to wake me up "  . Depression   . Duodenitis   . GERD (gastroesophageal reflux disease)   . Insomnia   . Migraines   . Mild dementia [facility])     Patient Active Problem List   Diagnosis Date Noted  . Dissection of abdominal aorta ([place]) 08/13/2017  . Chest pain 08/13/2017  . Hypertension 08/13/2017  . CKD (chronic kidney disease) 08/13/2017  . Anemia 08/13/2017    Past Surgical History:  Procedure Laterality Date  . ABDOMINAL HYSTERECTOMY    . BACK SURGERY    . BRAIN SURGERY  2013   meningioma  . SHOULDER SURGERY       OB History   None      Home Medications    Prior to Admission medications   Medication Sig Start Date End Date Taking? Authorizing Provider  acetaminophen (TYLENOL) 325 MG tablet Take 2 tablets (650 mg total) by mouth every 6 (six) hours as needed for mild pain, fever or headache (or Fever >/= 101). 08/14/17   Roxan Hockey, MD  amLODipine (NORVASC) 5 MG tablet Take 1 tablet (5 mg total) by mouth daily. For BP 08/15/17   Emokpae, Courage, MD  aspirin EC 81 MG EC  tablet Take 1 tablet (81 mg total) by mouth daily. With food 08/15/17   Roxan Hockey, MD  atorvastatin (LIPITOR) 40 MG tablet Take 1 tablet (40 mg total) by mouth daily at 6 PM. For cholesterol 08/14/17   Emokpae, Courage, MD  escitalopram (LEXAPRO) 10 MG tablet Take 10 mg by mouth at bedtime.  05/03/17   [provider]  ferrous sulfate 325 (65 FE) MG EC tablet Take 1 tablet (325 mg total) by mouth 2 (two) times daily with a meal. 08/14/17   Emokpae, Courage, MD  HYDROcodone-acetaminophen (NORCO/VICODIN) 5-325 MG tablet Take 1 tablet by mouth every 4-6 hours as needed for severe pain. 11/17/17   Ward, Ozella Almond, PA-C  MELATONIN-THEANINE PO Take 1-3 tablets by mouth at bedtime.    [provider]  ondansetron (ZOFRAN) 4 MG tablet Take 1 tablet (4 mg total) by mouth every 6 (six) hours as needed for nausea. 08/14/17   Roxan Hockey, MD  pantoprazole (PROTONIX) 40 MG tablet Take 1 tablet (40 mg total) by mouth 2 (two) times daily before a meal. 08/14/17 08/14/18  Roxan Hockey, MD  traZODone (DESYREL) 50 MG tablet Take 50 mg by mouth at bedtime.  05/03/17   [provider]  Family History Family History  Problem Relation Age of Onset  . Cancer Mother   . Stroke Father   . Heart disease Father     Social History Social History   Tobacco Use  . Smoking status: Former Research scientist (life sciences)  . Smokeless tobacco: Former Network engineer Use Topics  . Alcohol use: Not Currently  . Drug use: Never     Allergies   Patient has no known allergies.   Review of Systems Review of Systems  All other systems reviewed and are negative.    Physical Exam Updated Vital Signs BP (!) 196/99   Pulse 74   Temp 98.7 F (37.1 C)   Resp 16   SpO2 98%   Physical Exam  Constitutional: She is oriented to person, place, and time. She appears well-developed and well-nourished.  Non-toxic appearance. No distress.  HENT:  Head: Normocephalic and atraumatic.  Eyes: Pupils are  equal, round, and reactive to light. Conjunctivae, EOM and lids are normal.  Neck: Normal range of motion. Neck supple. No tracheal deviation present. No thyroid mass present.  Cardiovascular: Normal rate, regular rhythm and normal heart sounds. Exam reveals no gallop.  No murmur heard. Pulmonary/Chest: Effort normal and breath sounds normal. No stridor. No respiratory distress. She has no decreased breath sounds. She has no wheezes. She has no rhonchi. She has no rales.  Abdominal: Soft. Normal appearance and bowel sounds are normal. She exhibits no distension. There is no tenderness. There is no rebound and no CVA tenderness.  Musculoskeletal: Normal range of motion. She exhibits no edema or tenderness.  Neurological: She is alert and oriented to person, place, and time. She has normal strength. No cranial nerve deficit or sensory deficit. GCS eye subscore is 4. GCS verbal subscore is 5. GCS motor subscore is 6.  Skin: Skin is warm and dry. No abrasion and no rash noted.  Psychiatric: Her affect is blunt. Her speech is delayed. She is withdrawn. She is not actively hallucinating. She expresses no homicidal and no suicidal ideation. She expresses no suicidal plans and no homicidal plans. She is inattentive.  Nursing note and vitals reviewed.    ED Treatments / Results  Labs (all labs ordered are listed, but only abnormal results are displayed) Labs Reviewed  CBC WITH DIFFERENTIAL/PLATELET  COMPREHENSIVE METABOLIC PANEL  ETHANOL  RAPID URINE DRUG SCREEN, HOSP PERFORMED  TROPONIN I    EKG None  Radiology No results found.  Procedures Procedures (including critical care time)  Medications Ordered in ED Medications  0.9 %  sodium chloride infusion (has no administration in time range)     Initial Impression / Assessment and Plan / ED Course  I have reviewed the triage vital signs and the nursing notes.  Pertinent labs & imaging results that were available during my care of the  patient were reviewed by me and considered in my medical decision making (see chart for details).    pts work up here w/o acute findings Spoke with patients daughter at length and pt has long standing h/o insomnia Pt with likely narcolepsy and sleep apnea Pt encouraged to f/u her pcp to arrange a sleep study   Final Clinical Impressions(s) / ED Diagnoses   Final diagnoses:  SOB (shortness of breath)    ED Discharge Orders    None       Lacretia Leigh, MD 12/14/17 1002

## 2017-12-14 NOTE — ED Notes (Signed)
Attempted to notify pts daughter that pt is here in the ED being evaluated per pt request. No answer. Message left

## 2017-12-14 NOTE — ED Notes (Signed)
Patient transported to X-ray 

## 2017-12-14 NOTE — ED Notes (Signed)
ED Provider at bedside. 

## 2017-12-14 NOTE — ED Triage Notes (Signed)
Pt from home via EMS with c/o of difficulty breathing with panic. Pt was able to calm down with EMS. Per EMS pt was very labile. Pt has a caregiver per EMS who said pt was unable to report why she has a caregiver. However, pt is alert and oriented x 4. Pt had EMS text her daughter [name] from her phone at [phone]   172/110 HR 72 O2 Sat 99% RA

## 2017-12-14 NOTE — ED Notes (Signed)
Pts BP noted to be 206/104. MD made aware. Per MD. Give pt dose of home BP meds prior to DC

## 2017-12-14 NOTE — ED Notes (Signed)
[name] Daughter- [phone]

## 2017-12-17 ENCOUNTER — Encounter: Payer: Self-pay | Admitting: Diagnostic Neuroimaging

## 2017-12-17 ENCOUNTER — Ambulatory Visit: Payer: Medicare PPO | Admitting: Diagnostic Neuroimaging

## 2017-12-17 ENCOUNTER — Telehealth: Payer: Self-pay | Admitting: Diagnostic Neuroimaging

## 2017-12-17 ENCOUNTER — Telehealth: Payer: Self-pay | Admitting: *Deleted

## 2017-12-17 VITALS — BP 151/86 | HR 76 | Wt 134.0 lb

## 2017-12-17 DIAGNOSIS — F03A0 Unspecified dementia, mild, without behavioral disturbance, psychotic disturbance, mood disturbance, and anxiety: Secondary | ICD-10-CM

## 2017-12-17 DIAGNOSIS — F0390 Unspecified dementia without behavioral disturbance: Secondary | ICD-10-CM | POA: Diagnosis not present

## 2017-12-17 MED ORDER — MEMANTINE HCL 10 MG PO TABS: 10.0000 mg | ORAL_TABLET | Freq: Two times a day (BID) | ORAL | 12 refills | Status: AC

## 2017-12-17 NOTE — Telephone Encounter (Signed)
Per Dr Leta Baptist, spoke with daughter, [name] and gave her numbers for [facility] and [facility]. She asked what medications were prescribed today; advised her memantine, take twice daily.  She stated she would discuss Lexapro with PCP. She asked if there was any other information; advised her to see AVS on my chart. Advised she call for any questions, concerns. She verbalized understanding, appreciation.

## 2017-12-17 NOTE — Telephone Encounter (Signed)
Pts daughter [name] requesting a call stating the pts dementia s/e have declined and would like a call to discuss Dr. Leta Baptist calling in a medication before her upcoming appt. Please advise

## 2017-12-17 NOTE — Progress Notes (Signed)
[facility]  PATIENT: Jamie George DOB: 12-21-1945  REFERRING CLINICIAN: Wilhemena Durie, PA HISTORY FROM: patient and daughter  REASON FOR VISIT: follow up   HISTORICAL  CHIEF COMPLAINT:  Chief Complaint  Patient presents with  . Memory Loss    rm 7, dgtrLarene George  MMSE 25  . Headache    HISTORY OF PRESENT ILLNESS:   UPDATE (12/17/17, VRP): Since last visit, doing poorly. Symptoms are progressive. Now with rage outbursts, throwing things, mood swings subjective shortness of breath. Now cannot handle her own meds. Agitated.   PRIOR HPI (07/06/17, VRP): 72 year old female here for evaluation of memory loss.  I asked patient why she was referred here today, and she told me "my daughter asked me to come here".  Patient realizes that daughter is concerned about mild memory loss.  Patient does not feel like she has any significant memory problems.  According to daughter patient was living in [place] with husband until 14-Apr-2015 when he passed away.  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  Patient developed some depression following this.  Patient was having more difficulty living at home, operating appliances such as TV, blender, stove, coffee maker.  Patient's daughter was visiting her from [place] on a weekly basis.  Patient had thrown away several appliances because she told her daughter they "did not work".  Patient was trying to miss appointments.  She was having increasing questions and repetitive conversations with daughter.  She was losing weight, eating less, and having other problems.  Therefore patient's daughter arranged for patient to move to [facility] in February 2019.  Patient continues to have some early morning confusion.  This prompted family to arrange 3 hours of home health aide care.  Patient is able to perform most of her  activities of daily living such as dressing, toileting, eating and transferring.  She needs some help washing her hair but otherwise is able to bathe herself.  Jamie George ADL score 5 out of 6.  Regarding instrumental activities of daily living --> [place] instrumental ADL score 4 out of 8. She still able to use a telephone, do light housework, do simple financial transactions.  However she needs help with shopping, laundry, transportation, medications, food preparation.     REVIEW OF SYSTEMS: Full 14 system review of systems performed and negative with exception of: SOB agitation.  ALLERGIES: No Known Allergies  HOME MEDICATIONS: Outpatient Medications Prior to Visit  Medication Sig Dispense Refill  . acetaminophen (TYLENOL) 325 MG tablet Take 2 tablets (650 mg total) by mouth every 6 (six) hours as needed for mild pain, fever or headache (or Fever >/= 101). 30 tablet 1  . aspirin EC 81 MG EC tablet Take 1 tablet (81 mg total) by mouth daily. With food 120 tablet 2  . atorvastatin (LIPITOR) 40 MG tablet Take 1 tablet (40 mg total) by mouth daily at 6 PM. For cholesterol 30 tablet 2  . baclofen (LIORESAL) 10 MG tablet 12/17/17 1/2 - 1 tab up to 2 x daily, no more than 2 x week  0  . BESIVANCE 0.6 % SUSP INSTILL 1 DROP INTO RIGHT EYE 3 TIMES A DAY AS DIRECTED  1  . DUREZOL 0.05 % EMUL INSTILL 1 DROP INTO RIGHT EYE 3 TIMES A DAY  1  . escitalopram (LEXAPRO) 10 MG tablet Take 10 mg by mouth at  bedtime.   0  . ferrous sulfate 325 (65 FE) MG EC tablet Take 1 tablet (325 mg total) by mouth 2 (two) times daily with a meal. 60 tablet 3  . MELATONIN-THEANINE PO Take 1-3 tablets by mouth at bedtime.    . pantoprazole (PROTONIX) 40 MG tablet Take 1 tablet (40 mg total) by mouth 2 (two) times daily before a meal. 60 tablet 2  . PROLENSA 0.07 % SOLN APPLY 1 DROP INTO LEFT EYE AT BEDTIME AS DIRECTED  1  . traZODone (DESYREL) 50 MG tablet Take 50 mg by mouth at bedtime.   0  . verapamil (CALAN) 120 MG tablet Take  120 mg by mouth daily.    Marland Kitchen zonisamide (ZONEGRAN) 25 MG capsule 50 mg daily.  1  . amLODipine (NORVASC) 5 MG tablet Take 1 tablet (5 mg total) by mouth daily. For BP (Patient not taking: Reported on 12/17/2017) 30 tablet 2  . HYDROcodone-acetaminophen (NORCO/VICODIN) 5-325 MG tablet Take 1 tablet by mouth every 4-6 hours as needed for severe pain. (Patient not taking: Reported on 12/17/2017) 10 tablet 0  . ondansetron (ZOFRAN) 4 MG tablet Take 1 tablet (4 mg total) by mouth every 6 (six) hours as needed for nausea. (Patient not taking: Reported on 12/17/2017) 20 tablet 0   No facility-administered medications prior to visit.     PAST MEDICAL HISTORY: Past Medical History:  Diagnosis Date  . Cancer ([place])    skin  . Complication of anesthesia    " difficult to wake me up "  . Depression   . Duodenitis   . GERD (gastroesophageal reflux disease)   . Insomnia   . Migraines   . Mild dementia ([place])     PAST SURGICAL HISTORY: Past Surgical History:  Procedure Laterality Date  . ABDOMINAL HYSTERECTOMY    . BACK SURGERY    . BRAIN SURGERY  2013   meningioma  . SHOULDER SURGERY      FAMILY HISTORY: Family History  Problem Relation Age of Onset  . Cancer Mother   . Stroke Father   . Heart disease Father     SOCIAL HISTORY:  Social History   Socioeconomic History  . Marital status: Widowed    Spouse name: Not on file  . Number of children: 2  . Years of education: 25  . Highest education level: Not on file  Occupational History    Comment: retired  Scientific laboratory technician  . Financial resource strain: Not on file  . Food insecurity:    Worry: Not on file    Inability: Not on file  . Transportation needs:    Medical: Not on file    Non-medical: Not on file  Tobacco Use  . Smoking status: Former Research scientist (life sciences)  . Smokeless tobacco: Former Network engineer and Sexual Activity  . Alcohol use: Not Currently  . Drug use: Never  . Sexual activity: Not Currently  Lifestyle  . Physical  activity:    Days per week: Not on file    Minutes per session: Not on file  . Stress: Not on file  Relationships  . Social connections:    Talks on phone: Not on file    Gets together: Not on file    Attends religious service: Not on file    Active member of club or organization: Not on file    Attends meetings of clubs or organizations: Not on file    Relationship status: Not on file  . Intimate partner violence:  Fear of current or ex partner: Not on file    Emotionally abused: Not on file    Physically abused: Not on file    Forced sexual activity: Not on file  Other Topics Concern  . Not on file  Social History Narrative   12/17/17 Lives alone in home.  Retired.  Widowed.  Children 2 (daughter, Jamie George).     Caffeine some.      PHYSICAL EXAM  GENERAL EXAM/CONSTITUTIONAL: Vitals:  Vitals:   12/17/17 1237  BP: (!) 151/86  Pulse: 76  Weight: 134 lb (60.8 kg)   Body mass index is 21.63 kg/m. No exam data present  Patient is in no distress; well developed, nourished and groomed; neck is supple  LEFT UPPER LIP STERISTRIP / SWELLING (RECENT MOHS SURGERY)  CARDIOVASCULAR:  Examination of carotid arteries is normal; no carotid bruits  Regular rate and rhythm, no murmurs  Examination of peripheral vascular system by observation and palpation is normal  EYES:  Ophthalmoscopic exam of optic discs and posterior segments is normal; no papilledema or hemorrhages  MUSCULOSKELETAL:  Gait, strength, tone, movements noted in Neurologic exam below  NEUROLOGIC: MENTAL STATUS:  MMSE - [place] Exam 12/17/2017 07/06/2017  Orientation to time 3 4  Orientation to Place 4 3  Registration 3 3  Attention/ Calculation 4 4  Recall 2 2  Language- name 2 objects 2 2  Language- repeat 1 1  Language- follow 3 step command 3 2  Language- read & follow direction 1 1  Write a sentence 1 1  Copy design 1 1  Total score 25 24    SLEEPY, AGITATED, oriented to  person, place and time  [facility] MEMORY   DECR attention and concentration  language fluent, comprehension intact, naming intact,   fund of knowledge appropriate  DECR INSIGHT   CRANIAL NERVE:   2nd - no papilledema on fundoscopic exam  2nd, 3rd, 4th, 6th - pupils equal and reactive to light, visual fields full to confrontation, extraocular muscles intact, no nystagmus  5th - facial sensation symmetric  7th - facial strength symmetric  8th - hearing intact  9th - palate elevates symmetrically, uvula midline  11th - shoulder shrug symmetric  12th - tongue protrusion midline  MOTOR:   normal bulk and tone, full strength in the BUE, BLE  RESTLESS  SENSORY:   normal and symmetric to light touch  COORDINATION:   finger-nose-finger, fine finger movements normal  REFLEXES:   deep tendon reflexes present and symmetric  GAIT/STATION:   narrow based gait    DIAGNOSTIC DATA (LABS, IMAGING, TESTING) - I reviewed patient records, labs, notes, testing and imaging myself where available.  Lab Results  Component Value Date   WBC 10.4 12/14/2017   HGB 15.0 12/14/2017   HCT 48.9 (H) 12/14/2017   MCV 90.4 12/14/2017   PLT 327 12/14/2017      Component Value Date/Time   NA 142 12/14/2017 0755   K 3.8 12/14/2017 0755   CL 106 12/14/2017 0755   CO2 28 12/14/2017 0755   GLUCOSE 94 12/14/2017 0755   BUN 16 12/14/2017 0755   CREATININE 1.24 (H) 12/14/2017 0755   CALCIUM 9.4 12/14/2017 0755   PROT 6.3 (L) 12/14/2017 0755   ALBUMIN 3.7 12/14/2017 0755   AST 17 12/14/2017 0755   ALT 14 12/14/2017 0755   ALKPHOS 63 12/14/2017 0755   BILITOT 0.4 12/14/2017 0755   GFRNONAA 42 (L) 12/14/2017 0755   GFRAA 49 (L) 12/14/2017 3500  Lab Results  Component Value Date   CHOL 216 (H) 08/13/2017   HDL 59 08/13/2017   LDLCALC 137 (H) 08/13/2017   TRIG 102 08/13/2017   CHOLHDL 3.7 08/13/2017   No results found for: HGBA1C Lab Results  Component Value Date   VITAMINB12  519 08/13/2017   Lab Results  Component Value Date   TSH 1.627 08/13/2017    B12 503  TSH 0.88  11/17/17 CT HEAD: 1. No acute intracranial process. 2. Status post LEFT frontal craniotomy for meningioma resection with similar LEFT frontal encephalomalacia. 3. 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 Mild chronic small vessel ischemic changes.  11/17/17 CT ORBITS: 1. Normal contrast enhanced CT orbits.     ASSESSMENT AND PLAN  72 y.o. year old female here with gradual onset progressive short-term memory loss, confusion, decline in activities of daily living, decreased insight, depression, most consistent with neurodegenerative dementia. Now with significant mood changes and behavior changes   Dx: mild dementia with behavior disturbance (AD vs FTD)  1. Mild dementia ([place])      PLAN:  DEMENTIA WITH BEHAVIORAL DISTURBANCE - start memantine 10mg  twice a day  - continue lexapro 20mg  daily - caregiver resources reviewed - increase safety / supervision  SEVERE ANXIETY - continue lexapro - refer to psychiatry for mood stabilization  Meds ordered this encounter  Medications  . memantine (NAMENDA) 10 MG tablet    Sig: Take 1 tablet (10 mg total) by mouth 2 (two) times daily.    Dispense:  60 tablet    Refill:  12   Orders Placed This Encounter  Procedures  . Ambulatory referral to Psychiatry   Return in about 6 months (around 06/18/2018).    Penni Bombard, MD 41/58/3094, 0:76 PM Certified in Neurology, Neurophysiology and Neuroimaging  [facility] [address], [place], [place] [address] [phone]

## 2017-12-17 NOTE — Telephone Encounter (Signed)
Spoke with daughter, [name] on [place], who stated her mom has had recent episodes of feeling like she can't breathe. Last time it occurred the patient called 911, but all testing was normal. The daughter stated she is having increased number of anger outbursts, more behavior issues and anxiety. The daughter stated she has become increasingly more difficult to care for. She is asking for a medication or sooner appointment . The patient is scheduled for cataract surgery on 11/1 and an Endoscopy on 11/5. This RN changed her appt with Dr Leta Baptist from 11/18 to this afternoon, advised they arrive 30 minutes early to check in.  [name] verbalized understanding, appreciation.

## 2017-12-18 ENCOUNTER — Telehealth: Payer: Self-pay | Admitting: Diagnostic Neuroimaging

## 2017-12-18 MED ORDER — QUETIAPINE FUMARATE 25 MG PO TABS: 25.0000 mg | ORAL_TABLET | Freq: Two times a day (BID) | ORAL | 1 refills | Status: DC | PRN

## 2017-12-18 NOTE — Telephone Encounter (Signed)
Patient sent a message to Dr. Leta Baptist through my chart this morning. She is requesting a call back from the nurse because the patient is very agitated. Is there a medication immediately acting that patient can take until memantine (NAMENDA) 10 MG tablet starts working  Please call and discuss.

## 2017-12-18 NOTE — Addendum Note (Signed)
Addended by: Andrey Spearman R on: 12/18/2017 04:56 PM   Modules accepted: Orders

## 2017-12-18 NOTE — Telephone Encounter (Signed)
Will start:  Meds ordered this encounter  Medications  . QUEtiapine (SEROQUEL) 25 MG tablet    Sig: Take 1 tablet (25 mg total) by mouth 2 (two) times daily as needed.    Dispense:  30 tablet    Refill:  1   Penni Bombard, MD 88/12/313, 9:45 PM Certified in Neurology, Neurophysiology and Neuroimaging  [facility] [address] [place], [place] [address] [phone]

## 2017-12-19 NOTE — Telephone Encounter (Signed)
My chart message sent to daughter, Jamie George today: Yesterday evening Dr Leta Baptist sent in a new prescription for your mom, Seroquel. She can take 1 tab up to two times daily as needed. Please let us know if you have any questions or concerns.

## 2017-12-20 ENCOUNTER — Telehealth: Payer: Self-pay | Admitting: Diagnostic Neuroimaging

## 2017-12-20 DIAGNOSIS — H2511 Age-related nuclear cataract, right eye: Secondary | ICD-10-CM | POA: Diagnosis not present

## 2017-12-20 MED ORDER — QUETIAPINE FUMARATE 25 MG PO TABS: 25.0000 mg | ORAL_TABLET | Freq: Two times a day (BID) | ORAL | 1 refills | Status: DC | PRN

## 2017-12-20 NOTE — Telephone Encounter (Signed)
Pt daughter(on dpr-[place], [name]) has called stating that [facility] has told her a that 3 times they have sent a request for a PA for pt's QUEtiapine (SEROQUEL) 25 MG tablet .  Daughter states she is really wanting to get this medication for pt as soon as possible.  Please call

## 2017-12-20 NOTE — Telephone Encounter (Signed)
E scribed seroquel to [facility] again re: message form Anda K[employer] that original Rx was accidentally deleted.

## 2017-12-20 NOTE — Telephone Encounter (Addendum)
Attempted to reach [facility] to advise them this RN has not received a seroquel PA request for this patient.  No one answered after holding x 10 minutes. Called daughter and explained delay and apologized, advised her this RN will call [facility] first thing tomorrow and begin PA. Advised her it can take up to 72 business hours for moat PA's she verbalized understanding, appreciation.

## 2017-12-20 NOTE — Telephone Encounter (Signed)
Jamie George? With [facility] called stating the accidentally deleted pts RX for QUEtiapine (SEROQUEL) 25 MG tablet requesting a new one be sent over

## 2017-12-21 ENCOUNTER — Other Ambulatory Visit: Payer: Self-pay | Admitting: Diagnostic Neuroimaging

## 2017-12-21 DIAGNOSIS — H2511 Age-related nuclear cataract, right eye: Secondary | ICD-10-CM | POA: Diagnosis not present

## 2017-12-21 MED ORDER — CLONAZEPAM 0.5 MG PO TABS: 0.5000 mg | ORAL_TABLET | Freq: Two times a day (BID) | ORAL | 0 refills | Status: DC | PRN

## 2017-12-21 NOTE — Telephone Encounter (Addendum)
Unable to get through to [facility] pharmacy. Called [employer] help desk, spoke with Almyra Free who stated Quetiapine is denied: atypical antipsychotic alert- med is not recommended for elderly without diagnosis of psychosis.  She will send questionnaire by fax for PA. Ref #[id number], decision in 24-72 hours. Call [phone] for decision. Received fax, questions answered, signed by Dr Leta Baptist and faxed to [place].

## 2017-12-21 NOTE — Telephone Encounter (Signed)
Attempted to reach [facility] pharmacy, on hold > 5 minutes. Will call again later.

## 2017-12-21 NOTE — Telephone Encounter (Signed)
Spoke with daughter and informed her that the PA is not back on quetiapine, and Dr Leta Baptist prescribed clonazepam as a short term medication, take twice daily as needed for anxiety. Advised her that the patient is not to take both quetiapine and clonazepam together.  She then stated her mother is having headaches, and she'll call to make appointment for her to see Dr Leta Baptist for headaches in 3 months.  She had made appt with neurologist at a headache center but will cancel it. This RN advised this office has a dr on call on weekends if needed.  She verbalized understanding, appreciation of call.

## 2017-12-21 NOTE — Progress Notes (Signed)
Will try clonazepam short term. -VRP

## 2017-12-24 ENCOUNTER — Encounter (HOSPITAL_COMMUNITY): Payer: Self-pay | Admitting: Anesthesiology

## 2017-12-24 ENCOUNTER — Encounter: Payer: Self-pay | Admitting: *Deleted

## 2017-12-24 NOTE — Telephone Encounter (Addendum)
Received fax from [facility], re: Quetiapine Fumarate 25 mg approved until 02/20/19. Informed daughter, [name] through My chart and advised the patient must not take Quetiapine and clonazepam both.

## 2017-12-24 NOTE — Anesthesia Preprocedure Evaluation (Deleted)
Anesthesia Evaluation    History of Anesthesia Complications (+) PROLONGED EMERGENCE and history of anesthetic complications  Airway        Dental   Pulmonary former smoker,           Cardiovascular hypertension, Pt. on medications      Neuro/Psych  Headaches, PSYCHIATRIC DISORDERS Depression Dementia    GI/Hepatic GERD  Medicated,  Endo/Other  negative endocrine ROS  Renal/GU Renal InsufficiencyRenal disease  negative genitourinary   Musculoskeletal negative musculoskeletal ROS (+)   Abdominal   Peds  Hematology  (+) anemia ,   Anesthesia Other Findings   Reproductive/Obstetrics                             Anesthesia Physical Anesthesia Plan  ASA: III  Anesthesia Plan: MAC   Post-op Pain Management:    Induction: Intravenous  PONV Risk Score and Plan: Ondansetron and Propofol infusion  Airway Management Planned: Natural Airway, Nasal Cannula and Simple Face Mask  Additional Equipment:   Intra-op Plan:   Post-operative Plan:   Informed Consent:   Plan Discussed with:   Anesthesia Plan Comments:         Anesthesia Quick Evaluation

## 2017-12-25 ENCOUNTER — Encounter (HOSPITAL_COMMUNITY)
Admission: RE | Disposition: A | Discharge status: Home / Self Care | Payer: Self-pay | Source: Ambulatory Visit | Attending: Gastroenterology

## 2017-12-25 ENCOUNTER — Ambulatory Visit (HOSPITAL_COMMUNITY)
Admission: RE | Admit: 2017-12-25 | Discharge: 2017-12-25 | Disposition: A | Discharge status: Home / Self Care | Payer: Medicare PPO | Source: Ambulatory Visit | Attending: Gastroenterology | Admitting: Gastroenterology

## 2017-12-25 DIAGNOSIS — Z538 Procedure and treatment not carried out for other reasons: Secondary | ICD-10-CM | POA: Diagnosis not present

## 2017-12-25 DIAGNOSIS — R9389 Abnormal findings on diagnostic imaging of other specified body structures: Secondary | ICD-10-CM | POA: Insufficient documentation

## 2017-12-25 SURGERY — CANCELLED PROCEDURE
Anesthesia: Monitor Anesthesia Care

## 2017-12-25 MED ORDER — SODIUM CHLORIDE 0.9 % IV SOLN: INTRAVENOUS | Status: DC

## 2017-12-25 MED ORDER — PROPOFOL 10 MG/ML IV BOLUS
INTRAVENOUS | Status: AC
  Filled 2017-12-25: qty 20

## 2017-12-25 MED ORDER — LACTATED RINGERS IV SOLN: INTRAVENOUS | Status: DC

## 2017-12-25 MED ORDER — PROPOFOL 10 MG/ML IV BOLUS
INTRAVENOUS | Status: AC
  Filled 2017-12-25: qty 80

## 2017-12-25 NOTE — Progress Notes (Signed)
Patient and daughter arrived to have enteroscopy. Patient stated she ate cereal and a banana this am at 0530. The daughter stated that no one from pre surgical testing called her to give her instructions so she stated that she called Dr.Brahmbhatt's office and they told her the patient could eat something this morning. Dr. Royce Macadamia called and made aware and that patient would have to wait 8 hours. Dr.Brahmbhatt made aware and advised that patient would need to reschedule with his office for another date.

## 2017-12-26 ENCOUNTER — Other Ambulatory Visit: Payer: Self-pay | Admitting: Gastroenterology

## 2018-01-01 ENCOUNTER — Emergency Department (HOSPITAL_COMMUNITY): Payer: Medicare PPO

## 2018-01-01 ENCOUNTER — Other Ambulatory Visit: Payer: Self-pay

## 2018-01-01 ENCOUNTER — Emergency Department (HOSPITAL_COMMUNITY)
Admission: EM | Admit: 2018-01-01 | Discharge: 2018-01-04 | Disposition: A | Discharge status: Other Acute Inpatient Hospital | Payer: Medicare PPO | Attending: Emergency Medicine | Admitting: Emergency Medicine

## 2018-01-01 DIAGNOSIS — I129 Hypertensive chronic kidney disease with stage 1 through stage 4 chronic kidney disease, or unspecified chronic kidney disease: Secondary | ICD-10-CM | POA: Diagnosis not present

## 2018-01-01 DIAGNOSIS — F0391 Unspecified dementia with behavioral disturbance: Secondary | ICD-10-CM | POA: Insufficient documentation

## 2018-01-01 DIAGNOSIS — R0602 Shortness of breath: Secondary | ICD-10-CM | POA: Insufficient documentation

## 2018-01-01 DIAGNOSIS — Z87891 Personal history of nicotine dependence: Secondary | ICD-10-CM | POA: Insufficient documentation

## 2018-01-01 DIAGNOSIS — Z046 Encounter for general psychiatric examination, requested by authority: Secondary | ICD-10-CM | POA: Diagnosis present

## 2018-01-01 DIAGNOSIS — R451 Restlessness and agitation: Secondary | ICD-10-CM | POA: Diagnosis not present

## 2018-01-01 DIAGNOSIS — R4689 Other symptoms and signs involving appearance and behavior: Secondary | ICD-10-CM | POA: Insufficient documentation

## 2018-01-01 DIAGNOSIS — N189 Chronic kidney disease, unspecified: Secondary | ICD-10-CM | POA: Insufficient documentation

## 2018-01-01 DIAGNOSIS — Z79899 Other long term (current) drug therapy: Secondary | ICD-10-CM | POA: Diagnosis not present

## 2018-01-01 DIAGNOSIS — F329 Major depressive disorder, single episode, unspecified: Secondary | ICD-10-CM | POA: Diagnosis not present

## 2018-01-01 DIAGNOSIS — Z7982 Long term (current) use of aspirin: Secondary | ICD-10-CM | POA: Insufficient documentation

## 2018-01-01 DIAGNOSIS — R918 Other nonspecific abnormal finding of lung field: Secondary | ICD-10-CM | POA: Diagnosis not present

## 2018-01-01 DIAGNOSIS — R4182 Altered mental status, unspecified: Secondary | ICD-10-CM | POA: Insufficient documentation

## 2018-01-01 DIAGNOSIS — I6789 Other cerebrovascular disease: Secondary | ICD-10-CM | POA: Diagnosis not present

## 2018-01-01 DIAGNOSIS — F0390 Unspecified dementia without behavioral disturbance: Secondary | ICD-10-CM | POA: Diagnosis not present

## 2018-01-01 LAB — CBC
HCT: 47.1 % — ABNORMAL HIGH (ref 36.0–46.0)
HEMOGLOBIN: 14.9 g/dL (ref 12.0–15.0)
MCH: 28.7 pg (ref 26.0–34.0)
MCHC: 31.6 g/dL (ref 30.0–36.0)
MCV: 90.8 fL (ref 80.0–100.0)
NRBC: 0 % (ref 0.0–0.2)
Platelets: 347 10*3/uL (ref 150–400)
RBC: 5.19 MIL/uL — ABNORMAL HIGH (ref 3.87–5.11)
RDW: 15.9 % — AB (ref 11.5–15.5)
WBC: 10.3 10*3/uL (ref 4.0–10.5)

## 2018-01-01 LAB — COMPREHENSIVE METABOLIC PANEL
ALBUMIN: 3.6 g/dL (ref 3.5–5.0)
ALK PHOS: 55 U/L (ref 38–126)
ALT: 10 U/L (ref 0–44)
ANION GAP: 9 (ref 5–15)
AST: 20 U/L (ref 15–41)
BUN: 15 mg/dL (ref 8–23)
CO2: 26 mmol/L (ref 22–32)
CREATININE: 1.4 mg/dL — AB (ref 0.44–1.00)
Calcium: 9.2 mg/dL (ref 8.9–10.3)
Chloride: 106 mmol/L (ref 98–111)
GFR calc Af Amer: 42 mL/min — ABNORMAL LOW (ref >60)
GFR calc non Af Amer: 36 mL/min — ABNORMAL LOW (ref >60)
Glucose, Bld: 80 mg/dL (ref 70–99)
Potassium: 4.2 mmol/L (ref 3.5–5.1)
SODIUM: 141 mmol/L (ref 135–145)
Total Bilirubin: 1 mg/dL (ref 0.3–1.2)
Total Protein: 5.9 g/dL — ABNORMAL LOW (ref 6.5–8.1)

## 2018-01-01 LAB — TROPONIN I

## 2018-01-01 LAB — BRAIN NATRIURETIC PEPTIDE: B Natriuretic Peptide: 18.3 pg/mL (ref 0.0–100.0)

## 2018-01-01 MED ORDER — DIFLUPREDNATE 0.05 % OP EMUL
1.0000 [drp] | Freq: Three times a day (TID) | OPHTHALMIC | Status: DC
  Administered 2018-01-03: 1 [drp] via OPHTHALMIC

## 2018-01-01 MED ORDER — ASPIRIN EC 81 MG PO TBEC
81.0000 mg | DELAYED_RELEASE_TABLET | Freq: Every day | ORAL | Status: DC
  Administered 2018-01-02 – 2018-01-04 (×3): 81 mg via ORAL
  Filled 2018-01-01 (×3): qty 1

## 2018-01-01 MED ORDER — ATORVASTATIN CALCIUM 20 MG PO TABS
20.0000 mg | ORAL_TABLET | ORAL | Status: DC
  Administered 2018-01-03: 20 mg via ORAL
  Filled 2018-01-01: qty 1

## 2018-01-01 MED ORDER — QUETIAPINE FUMARATE 25 MG PO TABS
25.0000 mg | ORAL_TABLET | Freq: Every day | ORAL | Status: DC
  Administered 2018-01-01 – 2018-01-02 (×2): 25 mg via ORAL
  Filled 2018-01-01 (×2): qty 1

## 2018-01-01 MED ORDER — VERAPAMIL HCL ER 120 MG PO TBCR
120.0000 mg | EXTENDED_RELEASE_TABLET | Freq: Every day | ORAL | Status: DC
  Administered 2018-01-02 – 2018-01-04 (×3): 120 mg via ORAL
  Filled 2018-01-01 (×3): qty 1

## 2018-01-01 MED ORDER — ACETAMINOPHEN 325 MG PO TABS
650.0000 mg | ORAL_TABLET | Freq: Four times a day (QID) | ORAL | Status: DC | PRN
  Administered 2018-01-02 – 2018-01-04 (×6): 650 mg via ORAL
  Filled 2018-01-01 (×7): qty 2

## 2018-01-01 MED ORDER — KETOROLAC TROMETHAMINE 0.5 % OP SOLN
1.0000 [drp] | Freq: Every day | OPHTHALMIC | Status: DC
  Administered 2018-01-01 – 2018-01-04 (×5): 1 [drp] via OPHTHALMIC
  Filled 2018-01-01: qty 5

## 2018-01-01 MED ORDER — ZONISAMIDE 100 MG PO CAPS
100.0000 mg | ORAL_CAPSULE | Freq: Every day | ORAL | Status: DC
  Administered 2018-01-01 – 2018-01-03 (×3): 100 mg via ORAL
  Filled 2018-01-01 (×3): qty 1

## 2018-01-01 MED ORDER — PANTOPRAZOLE SODIUM 40 MG PO TBEC
40.0000 mg | DELAYED_RELEASE_TABLET | Freq: Two times a day (BID) | ORAL | Status: DC
  Administered 2018-01-01 – 2018-01-04 (×6): 40 mg via ORAL
  Filled 2018-01-01 (×6): qty 1

## 2018-01-01 MED ORDER — ONDANSETRON HCL 4 MG PO TABS
4.0000 mg | ORAL_TABLET | Freq: Four times a day (QID) | ORAL | Status: DC | PRN
  Administered 2018-01-02 – 2018-01-04 (×4): 4 mg via ORAL
  Filled 2018-01-01 (×4): qty 1

## 2018-01-01 MED ORDER — MEMANTINE HCL 10 MG PO TABS
10.0000 mg | ORAL_TABLET | Freq: Two times a day (BID) | ORAL | Status: DC
  Administered 2018-01-01 – 2018-01-04 (×6): 10 mg via ORAL
  Filled 2018-01-01 (×6): qty 1

## 2018-01-01 MED ORDER — ESCITALOPRAM OXALATE 10 MG PO TABS
20.0000 mg | ORAL_TABLET | Freq: Every day | ORAL | Status: DC
  Administered 2018-01-01 – 2018-01-03 (×3): 20 mg via ORAL
  Filled 2018-01-01 (×3): qty 2

## 2018-01-01 MED ORDER — ZONISAMIDE 100 MG PO CAPS: 100.0000 mg | ORAL_CAPSULE | Freq: Every day | ORAL | Status: DC

## 2018-01-01 MED ORDER — TRAZODONE HCL 50 MG PO TABS
50.0000 mg | ORAL_TABLET | Freq: Every day | ORAL | Status: DC
  Administered 2018-01-01 – 2018-01-03 (×3): 50 mg via ORAL
  Filled 2018-01-01 (×3): qty 1

## 2018-01-01 MED ORDER — GATIFLOXACIN 0.5 % OP SOLN
1.0000 [drp] | Freq: Three times a day (TID) | OPHTHALMIC | Status: DC
  Administered 2018-01-01 – 2018-01-04 (×8): 1 [drp] via OPHTHALMIC
  Filled 2018-01-01: qty 2.5

## 2018-01-01 MED ORDER — ACETAMINOPHEN 500 MG PO TABS
1000.0000 mg | ORAL_TABLET | Freq: Once | ORAL | Status: AC
  Administered 2018-01-01: 1000 mg via ORAL
  Filled 2018-01-01: qty 2

## 2018-01-01 NOTE — ED Notes (Signed)
When this nurse was triaging patient, patient pretending to be asleep and not answering any questions. Pt did finally allow me to get vitals

## 2018-01-01 NOTE — ED Notes (Signed)
Specimen cup provided, pt encouraged to provide urine

## 2018-01-01 NOTE — ED Notes (Signed)
Spoke with TTS, informs this nurse they are searching for geri psych placement for pt.

## 2018-01-01 NOTE — Discharge Instructions (Signed)
NOTE: Follow-up [facility]A prior to disposition
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
## 2018-01-01 NOTE — ED Notes (Signed)
Bed: GX27 Expected date: 01/01/18 Expected time: 1:20 PM Means of arrival: Ambulance Comments: Elderly aggressive from home
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
## 2018-01-01 NOTE — ED Notes (Signed)
Attempted to get pt dressed out in purple scrubs, pt was talking to me in the beginning and then pretended to be asleep.Pt would not answer any questions from me or her nurse.

## 2018-01-01 NOTE — ED Notes (Signed)
Visitor at bedside, dinner provided.

## 2018-01-02 DIAGNOSIS — F0391 Unspecified dementia with behavioral disturbance: Secondary | ICD-10-CM | POA: Diagnosis not present

## 2018-01-02 DIAGNOSIS — R451 Restlessness and agitation: Secondary | ICD-10-CM | POA: Diagnosis not present

## 2018-01-02 DIAGNOSIS — R4689 Other symptoms and signs involving appearance and behavior: Secondary | ICD-10-CM | POA: Diagnosis present

## 2018-01-02 DIAGNOSIS — F0390 Unspecified dementia without behavioral disturbance: Secondary | ICD-10-CM | POA: Diagnosis present

## 2018-01-02 LAB — URINALYSIS, ROUTINE W REFLEX MICROSCOPIC
Bilirubin Urine: NEGATIVE
GLUCOSE, UA: NEGATIVE mg/dL
Ketones, ur: NEGATIVE mg/dL
Leukocytes, UA: NEGATIVE
NITRITE: NEGATIVE
PH: 6 (ref 5.0–8.0)
Protein, ur: NEGATIVE mg/dL
SPECIFIC GRAVITY, URINE: 1.008 (ref 1.005–1.030)

## 2018-01-02 LAB — RAPID URINE DRUG SCREEN, HOSP PERFORMED
AMPHETAMINES: NOT DETECTED
BARBITURATES: NOT DETECTED
BENZODIAZEPINES: NOT DETECTED
COCAINE: NOT DETECTED
Opiates: NOT DETECTED
TETRAHYDROCANNABINOL: NOT DETECTED

## 2018-01-02 LAB — ETHANOL: Alcohol, Ethyl (B): <10 mg/dL (ref <10)

## 2018-01-02 MED ORDER — LORAZEPAM 2 MG/ML IJ SOLN
0.5000 mg | Freq: Once | INTRAMUSCULAR | Status: AC
  Administered 2018-01-02: 0.5 mg via INTRAVENOUS
  Filled 2018-01-02: qty 1

## 2018-01-02 MED ORDER — DIPHENHYDRAMINE HCL 25 MG PO CAPS
25.0000 mg | ORAL_CAPSULE | Freq: Once | ORAL | Status: AC
  Administered 2018-01-02: 25 mg via ORAL
  Filled 2018-01-02: qty 1

## 2018-01-02 MED ORDER — PROCHLORPERAZINE MALEATE 10 MG PO TABS
10.0000 mg | ORAL_TABLET | Freq: Once | ORAL | Status: AC
  Administered 2018-01-02: 10 mg via ORAL
  Filled 2018-01-02: qty 1

## 2018-01-02 MED ORDER — ZONISAMIDE 100 MG PO CAPS: 100.0000 mg | ORAL_CAPSULE | Freq: Every day | ORAL | Status: DC
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
## 2018-01-02 NOTE — ED Notes (Signed)
Family at bedside. DAUGHTER AT BEDSIDE
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
## 2018-01-02 NOTE — ED Notes (Signed)
Family at bedside. 

## 2018-01-02 NOTE — ED Notes (Signed)
LAB WILL PROCESS URINE DRUG SCREEN FROM URINE ALREADY COLLECTED.

## 2018-01-02 NOTE — ED Notes (Signed)
Patient has been irritable but not aggressive . Patient still refuses to put on scrubs. Encouragement and support provided and safety maintain.

## 2018-01-02 NOTE — ED Notes (Signed)
Pt resting in bed, will continue to monitor.
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
## 2018-01-02 NOTE — ED Notes (Signed)
TTS Provider at bedside. 
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
## 2018-01-02 NOTE — ED Notes (Signed)
Pt continues to voice complaints of a headache.  Dr Billy Fischer notified.  Med orders given.

## 2018-01-02 NOTE — Progress Notes (Signed)
[facility] CSW received phone call from [facility], [facility]. Per Stanton Kidney, medical doctor is requesting more stable blood pressure. Mary requested that pt's vital, specifically blood pressure information, be sent tomorrow around lunch time.   Wendelyn Breslow, [facility]  [phone]

## 2018-01-02 NOTE — ED Notes (Addendum)
DAUGHTER AT BEDSIDE. SHE STATES SHE IS HCPOA. DAUGHTER AND PT INQUIRING WHEN PSYCH DOCTOR WILL BE IN TO SEE PATIENT. THIS WRITER EARLIER (WITH PATIENT) AND SITTER CURRENTLY WITH PATIENT AND DAUGHTER STATES PSYCH TEAM WILL BE MAKING ROUNDS AFTER 10 AM. THIS STATEMENT HAS BEEN MADE MULTIPLE TIMES TO PATIENT HOWEVER PATIENT APPEARS TO BE UNABLE TO RETAIN THIS INFORMATION. WILL CONTINUE TO REMIND PATIENT AS NEEDED.

## 2018-01-02 NOTE — ED Notes (Addendum)
Pt awake, alert & responsive, with sitter at bedside.  Daughter at bedside also. Monitoring for safety.  Calm & cooperative at present.
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
## 2018-01-02 NOTE — ED Notes (Signed)
TOM THOMAS SPEAKING WITH DAUGHTER HCPOA SHANNON AT PRESENT ABOUT MOTHER'S PLAN OF CARE.

## 2018-01-03 DIAGNOSIS — F0391 Unspecified dementia with behavioral disturbance: Secondary | ICD-10-CM | POA: Diagnosis not present

## 2018-01-03 DIAGNOSIS — R4689 Other symptoms and signs involving appearance and behavior: Secondary | ICD-10-CM | POA: Diagnosis not present

## 2018-01-03 MED ORDER — ASENAPINE MALEATE 5 MG SL SUBL
5.0000 mg | SUBLINGUAL_TABLET | Freq: Two times a day (BID) | SUBLINGUAL | Status: DC
  Administered 2018-01-03 – 2018-01-04 (×3): 5 mg via SUBLINGUAL
  Filled 2018-01-03 (×3): qty 1

## 2018-01-03 NOTE — ED Notes (Signed)
Patient's blood pressure has to be WDL before she can get moved to a different facility
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
## 2018-01-03 NOTE — ED Notes (Addendum)
Pt in bed resting.  She is irritable and demanding but has shown no aggression this morning.

## 2018-01-03 NOTE — ED Notes (Signed)
Pt states she cant breathe, vitals stable. Dr Florina Ou made aware. Dr. Florina Ou came to assess pt.

## 2018-01-03 NOTE — ED Notes (Signed)
Pt complained of a headache, tylenol given.

## 2018-01-03 NOTE — ED Notes (Signed)
Spoke with pharmacy, do not carry difluprednate eye drops.

## 2018-01-03 NOTE — BH Assessment (Addendum)
[facility] Assessment Progress Note  Please see note entered by Wendelyn Breslow, LCSW, dated 01/02/2018 at 20:59.  Per [facility] request, new vital signs have been faxed to them, and at 13:08 EJ confirms receipt.  Final decision is pending as of this writing.  Jalene Mullet, [place] [facility] Coordinator [phone]   Addendum:  Lenna Sciara calls from [facility].  She reports that they will not have beds available today, but are considering this pt for tomorrow (01/04/2018).  She requests updated vital signs, which have since been faxed to her.  Decision is pending as of this writing.  Jalene Mullet, [place] Coordinator [phone]

## 2018-01-03 NOTE — ED Notes (Signed)
Contact for son.  Hunter Fleshhood [phone].

## 2018-01-03 NOTE — ED Notes (Signed)
Pt ask for nausea medicine or headache medicine each time she wakes up, even if it hasnt been but a short time.  I have not observed patient showing signs of not feeling well.  She is very irritable when she speaks.  She denies S/I and H/I and AVH.  Sitter at bedside.

## 2018-01-04 DIAGNOSIS — Z87891 Personal history of nicotine dependence: Secondary | ICD-10-CM | POA: Diagnosis not present

## 2018-01-04 DIAGNOSIS — Z85828 Personal history of other malignant neoplasm of skin: Secondary | ICD-10-CM | POA: Diagnosis not present

## 2018-01-04 DIAGNOSIS — G43909 Migraine, unspecified, not intractable, without status migrainosus: Secondary | ICD-10-CM | POA: Diagnosis not present

## 2018-01-04 DIAGNOSIS — E7800 Pure hypercholesterolemia, unspecified: Secondary | ICD-10-CM | POA: Diagnosis not present

## 2018-01-04 DIAGNOSIS — R4689 Other symptoms and signs involving appearance and behavior: Secondary | ICD-10-CM | POA: Diagnosis not present

## 2018-01-04 DIAGNOSIS — K219 Gastro-esophageal reflux disease without esophagitis: Secondary | ICD-10-CM | POA: Diagnosis not present

## 2018-01-04 DIAGNOSIS — F0391 Unspecified dementia with behavioral disturbance: Secondary | ICD-10-CM | POA: Diagnosis not present

## 2018-01-04 DIAGNOSIS — N183 Chronic kidney disease, stage 3 (moderate): Secondary | ICD-10-CM | POA: Diagnosis not present

## 2018-01-04 DIAGNOSIS — I10 Essential (primary) hypertension: Secondary | ICD-10-CM | POA: Diagnosis not present

## 2018-01-04 DIAGNOSIS — Z8673 Personal history of transient ischemic attack (TIA), and cerebral infarction without residual deficits: Secondary | ICD-10-CM | POA: Diagnosis not present

## 2018-01-04 DIAGNOSIS — N3001 Acute cystitis with hematuria: Secondary | ICD-10-CM | POA: Diagnosis not present

## 2018-01-04 DIAGNOSIS — E870 Hyperosmolality and hypernatremia: Secondary | ICD-10-CM | POA: Diagnosis not present

## 2018-01-04 DIAGNOSIS — E785 Hyperlipidemia, unspecified: Secondary | ICD-10-CM | POA: Diagnosis not present

## 2018-01-04 DIAGNOSIS — I129 Hypertensive chronic kidney disease with stage 1 through stage 4 chronic kidney disease, or unspecified chronic kidney disease: Secondary | ICD-10-CM | POA: Diagnosis not present

## 2018-01-04 NOTE — ED Notes (Signed)
Pt c/o h/a stating "I can't ever get rid of this h/a."

## 2018-01-04 NOTE — BH Assessment (Addendum)
[facility] Assessment Progress Note  Per Buford Dresser, DO, this pt continues to require psychiatric hospitalization at this time.  Pt presents under IVC initiated by EDP Milton Ferguson, MD.  At 13:12 Caryl Pina calls from [facility] to report that pt has been accepted to their facility by Dr Alonna Minium.  Dr Mariea Clonts concurs with this decision.  Pt's nurse, Caren Griffins, has been notified, and agrees to call report to [phone].  Pt is to be transported via [facility].  [place] Coordinator [phone]  Addendum:  At 13:49 I called [name] [phone]) to notify her of pt's disposition, speaking to her directly.  At her request I sent contact information for [facility] to her via e-mail.  At 13:52 I called pt's other Annye Asa [phone]) to notify him as well.  Call rolled to voice mail, and I left a HIPAA compliant message.  As of this writing, return call is pending.  Jalene Mullet, [place] Coordinator [phone]

## 2018-01-04 NOTE — ED Notes (Signed)
Psychiatrist in to see patient.  New set of vital signs obtained due to patient's earlier complaints of difficulty breathing, nausea, and headache.

## 2018-01-04 NOTE — ED Notes (Signed)
Pt given ginger ale & grahams.
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
## 2018-01-04 NOTE — ED Notes (Signed)
Bed: W[facility]35 Expected date:  Expected time:  Means of arrival:  Comments: 

## 2018-01-04 NOTE — ED Notes (Signed)
Difluprednate missing from medication drawer.  Message sent to pharmacy to replace.

## 2018-01-04 NOTE — ED Notes (Signed)
Patient eating graham crackers, cheese stick, and applesauce.  She is keeping this down.

## 2018-01-04 NOTE — ED Notes (Signed)
Patient c/o nausea and a headache.  Gave patient her prn zofran and tylenol along with her morning medications.  Patient has not been able to eat any breakfast but nurse let her know if she starts to feel better we could warm it up for her.

## 2018-01-04 NOTE — ED Notes (Signed)
Report called to Clare Charon RN at [place].  Sheriff called for transport.

## 2018-01-04 NOTE — ED Notes (Signed)
Took medication into patient.  Patient pretending to be asleep.  "I have told two people this morning that I am having trouble breathing."  "Just let me die and contact my daughter and let her know."  Nurse reassured patient that she is breathing just fine and is not short of breath.  02 sats-94%.  Patient calm and cooperative throughout the night and this morning.

## 2018-01-07 ENCOUNTER — Ambulatory Visit: Payer: Medicare PPO | Admitting: Diagnostic Neuroimaging

## 2018-01-14 DIAGNOSIS — K279 Peptic ulcer, site unspecified, unspecified as acute or chronic, without hemorrhage or perforation: Secondary | ICD-10-CM | POA: Diagnosis not present

## 2018-01-14 DIAGNOSIS — R413 Other amnesia: Secondary | ICD-10-CM | POA: Diagnosis not present

## 2018-01-14 DIAGNOSIS — R4182 Altered mental status, unspecified: Secondary | ICD-10-CM | POA: Diagnosis not present

## 2018-01-14 DIAGNOSIS — R8290 Unspecified abnormal findings in urine: Secondary | ICD-10-CM | POA: Diagnosis not present

## 2018-01-14 DIAGNOSIS — I10 Essential (primary) hypertension: Secondary | ICD-10-CM | POA: Diagnosis not present

## 2018-01-30 DIAGNOSIS — N390 Urinary tract infection, site not specified: Secondary | ICD-10-CM | POA: Diagnosis not present

## 2018-02-07 ENCOUNTER — Ambulatory Visit: Payer: Medicare PPO | Admitting: Nurse Practitioner

## 2018-02-18 DIAGNOSIS — R488 Other symbolic dysfunctions: Secondary | ICD-10-CM | POA: Diagnosis not present

## 2018-02-18 DIAGNOSIS — R2681 Unsteadiness on feet: Secondary | ICD-10-CM | POA: Diagnosis not present

## 2018-02-18 DIAGNOSIS — M6281 Muscle weakness (generalized): Secondary | ICD-10-CM | POA: Diagnosis not present

## 2018-02-19 DIAGNOSIS — R2681 Unsteadiness on feet: Secondary | ICD-10-CM | POA: Diagnosis not present

## 2018-02-19 DIAGNOSIS — R488 Other symbolic dysfunctions: Secondary | ICD-10-CM | POA: Diagnosis not present

## 2018-02-19 DIAGNOSIS — M6281 Muscle weakness (generalized): Secondary | ICD-10-CM | POA: Diagnosis not present

## 2018-02-21 DIAGNOSIS — F330 Major depressive disorder, recurrent, mild: Secondary | ICD-10-CM | POA: Diagnosis not present

## 2018-02-21 DIAGNOSIS — F0150 Vascular dementia without behavioral disturbance: Secondary | ICD-10-CM | POA: Diagnosis not present

## 2018-02-22 DIAGNOSIS — R2681 Unsteadiness on feet: Secondary | ICD-10-CM | POA: Diagnosis not present

## 2018-02-22 DIAGNOSIS — M6281 Muscle weakness (generalized): Secondary | ICD-10-CM | POA: Diagnosis not present

## 2018-02-22 DIAGNOSIS — R488 Other symbolic dysfunctions: Secondary | ICD-10-CM | POA: Diagnosis not present

## 2018-02-23 ENCOUNTER — Other Ambulatory Visit: Payer: Self-pay

## 2018-02-23 ENCOUNTER — Encounter (HOSPITAL_COMMUNITY): Payer: Self-pay | Admitting: Emergency Medicine

## 2018-02-23 ENCOUNTER — Emergency Department (HOSPITAL_COMMUNITY): Payer: Medicare PPO

## 2018-02-23 ENCOUNTER — Emergency Department (HOSPITAL_COMMUNITY)
Admission: EM | Admit: 2018-02-23 | Discharge: 2018-02-23 | Disposition: A | Discharge status: Home / Self Care | Payer: Medicare PPO | Attending: Emergency Medicine | Admitting: Emergency Medicine

## 2018-02-23 DIAGNOSIS — K5792 Diverticulitis of intestine, part unspecified, without perforation or abscess without bleeding: Secondary | ICD-10-CM | POA: Diagnosis not present

## 2018-02-23 DIAGNOSIS — Z7982 Long term (current) use of aspirin: Secondary | ICD-10-CM | POA: Diagnosis not present

## 2018-02-23 DIAGNOSIS — N189 Chronic kidney disease, unspecified: Secondary | ICD-10-CM | POA: Insufficient documentation

## 2018-02-23 DIAGNOSIS — Z79899 Other long term (current) drug therapy: Secondary | ICD-10-CM | POA: Diagnosis not present

## 2018-02-23 DIAGNOSIS — Z85828 Personal history of other malignant neoplasm of skin: Secondary | ICD-10-CM | POA: Insufficient documentation

## 2018-02-23 DIAGNOSIS — I129 Hypertensive chronic kidney disease with stage 1 through stage 4 chronic kidney disease, or unspecified chronic kidney disease: Secondary | ICD-10-CM | POA: Insufficient documentation

## 2018-02-23 DIAGNOSIS — Z87891 Personal history of nicotine dependence: Secondary | ICD-10-CM | POA: Insufficient documentation

## 2018-02-23 DIAGNOSIS — R1032 Left lower quadrant pain: Secondary | ICD-10-CM | POA: Diagnosis present

## 2018-02-23 DIAGNOSIS — K5900 Constipation, unspecified: Secondary | ICD-10-CM

## 2018-02-23 DIAGNOSIS — K862 Cyst of pancreas: Secondary | ICD-10-CM

## 2018-02-23 DIAGNOSIS — R1084 Generalized abdominal pain: Secondary | ICD-10-CM | POA: Diagnosis not present

## 2018-02-23 DIAGNOSIS — R0902 Hypoxemia: Secondary | ICD-10-CM | POA: Diagnosis not present

## 2018-02-23 DIAGNOSIS — G4489 Other headache syndrome: Secondary | ICD-10-CM | POA: Diagnosis not present

## 2018-02-23 DIAGNOSIS — R52 Pain, unspecified: Secondary | ICD-10-CM | POA: Diagnosis not present

## 2018-02-23 LAB — CBC
HCT: 47.9 % — ABNORMAL HIGH (ref 36.0–46.0)
Hemoglobin: 14.7 g/dL (ref 12.0–15.0)
MCH: 27.8 pg (ref 26.0–34.0)
MCHC: 30.7 g/dL (ref 30.0–36.0)
MCV: 90.5 fL (ref 80.0–100.0)
Platelets: 351 10*3/uL (ref 150–400)
RBC: 5.29 MIL/uL — ABNORMAL HIGH (ref 3.87–5.11)
RDW: 15.9 % — ABNORMAL HIGH (ref 11.5–15.5)
WBC: 13.9 10*3/uL — AB (ref 4.0–10.5)
nRBC: 0 % (ref 0.0–0.2)

## 2018-02-23 LAB — COMPREHENSIVE METABOLIC PANEL
ALT: 7 U/L (ref 0–44)
AST: 11 U/L — ABNORMAL LOW (ref 15–41)
Albumin: 3.3 g/dL — ABNORMAL LOW (ref 3.5–5.0)
Alkaline Phosphatase: 78 U/L (ref 38–126)
Anion gap: 9 (ref 5–15)
BILIRUBIN TOTAL: 0.6 mg/dL (ref 0.3–1.2)
BUN: 11 mg/dL (ref 8–23)
CO2: 27 mmol/L (ref 22–32)
CREATININE: 1.34 mg/dL — AB (ref 0.44–1.00)
Calcium: 9.1 mg/dL (ref 8.9–10.3)
Chloride: 107 mmol/L (ref 98–111)
GFR calc Af Amer: 45 mL/min — ABNORMAL LOW (ref >60)
GFR calc non Af Amer: 39 mL/min — ABNORMAL LOW (ref >60)
GLUCOSE: 101 mg/dL — AB (ref 70–99)
Potassium: 3.9 mmol/L (ref 3.5–5.1)
SODIUM: 143 mmol/L (ref 135–145)
TOTAL PROTEIN: 6.3 g/dL — AB (ref 6.5–8.1)

## 2018-02-23 LAB — LIPASE, BLOOD: LIPASE: 42 U/L (ref 11–51)

## 2018-02-23 LAB — URINALYSIS, ROUTINE W REFLEX MICROSCOPIC
Bilirubin Urine: NEGATIVE
Glucose, UA: NEGATIVE mg/dL
Ketones, ur: NEGATIVE mg/dL
Nitrite: NEGATIVE
PROTEIN: NEGATIVE mg/dL
Specific Gravity, Urine: 1.01 (ref 1.005–1.030)
pH: 7 (ref 5.0–8.0)

## 2018-02-23 LAB — URINALYSIS, MICROSCOPIC (REFLEX): WBC, UA: >50 WBC/hpf (ref 0–5)

## 2018-02-23 MED ORDER — POLYETHYLENE GLYCOL 3350 17 GM/SCOOP PO POWD: 1.0000 | Freq: Once | ORAL | 0 refills | Status: AC

## 2018-02-23 MED ORDER — HYDROCODONE-ACETAMINOPHEN 5-325 MG PO TABS: 1.0000 | ORAL_TABLET | Freq: Four times a day (QID) | ORAL | 0 refills | Status: DC | PRN

## 2018-02-23 MED ORDER — SODIUM CHLORIDE 0.9 % IV BOLUS
1000.0000 mL | Freq: Once | INTRAVENOUS | Status: AC
  Administered 2018-02-23: 1000 mL via INTRAVENOUS

## 2018-02-23 MED ORDER — SODIUM CHLORIDE 0.9 % IV SOLN
INTRAVENOUS | Status: DC
  Administered 2018-02-23: 08:00:00 via INTRAVENOUS

## 2018-02-23 MED ORDER — METRONIDAZOLE IN NACL 5-0.79 MG/ML-% IV SOLN
500.0000 mg | Freq: Once | INTRAVENOUS | Status: AC
  Administered 2018-02-23: 500 mg via INTRAVENOUS
  Filled 2018-02-23: qty 100

## 2018-02-23 MED ORDER — IOPAMIDOL (ISOVUE-300) INJECTION 61%
INTRAVENOUS | Status: AC
  Filled 2018-02-23: qty 100

## 2018-02-23 MED ORDER — METRONIDAZOLE 500 MG PO TABS: 500.0000 mg | ORAL_TABLET | Freq: Two times a day (BID) | ORAL | 0 refills | Status: DC

## 2018-02-23 MED ORDER — SODIUM CHLORIDE 0.9 % IV SOLN
2.0000 g | Freq: Once | INTRAVENOUS | Status: AC
  Administered 2018-02-23: 2 g via INTRAVENOUS
  Filled 2018-02-23: qty 20

## 2018-02-23 MED ORDER — ONDANSETRON HCL 4 MG/2ML IJ SOLN
4.0000 mg | Freq: Once | INTRAMUSCULAR | Status: AC
  Administered 2018-02-23: 4 mg via INTRAVENOUS
  Filled 2018-02-23: qty 2

## 2018-02-23 MED ORDER — MORPHINE SULFATE (PF) 4 MG/ML IV SOLN
4.0000 mg | Freq: Once | INTRAVENOUS | Status: AC
  Administered 2018-02-23: 4 mg via INTRAVENOUS
  Filled 2018-02-23: qty 1

## 2018-02-23 MED ORDER — ACETAMINOPHEN 500 MG PO TABS: 500.0000 mg | ORAL_TABLET | Freq: Four times a day (QID) | ORAL | 0 refills | Status: DC | PRN

## 2018-02-23 MED ORDER — IOPAMIDOL (ISOVUE-300) INJECTION 61%
80.0000 mL | Freq: Once | INTRAVENOUS | Status: AC | PRN
  Administered 2018-02-23: 80 mL via INTRAVENOUS

## 2018-02-23 MED ORDER — CIPROFLOXACIN HCL 500 MG PO TABS: 500.0000 mg | ORAL_TABLET | Freq: Two times a day (BID) | ORAL | 0 refills | Status: DC

## 2018-02-23 MED ORDER — ONDANSETRON 4 MG PO TBDP: 4.0000 mg | ORAL_TABLET | Freq: Once | ORAL | Status: DC | PRN

## 2018-02-23 MED ORDER — SODIUM CHLORIDE (PF) 0.9 % IJ SOLN
INTRAMUSCULAR | Status: AC
  Filled 2018-02-23: qty 50

## 2018-02-23 NOTE — ED Notes (Signed)
Patient is from [facility].

## 2018-02-23 NOTE — ED Triage Notes (Addendum)
Patient coming from [place] from [facility]. Patient is complaining of left lower abdominal pain. Patient states this being going on for two days. Denies N/V/D and constipation. Patient has a hx of gerd and dementia to paperwork.

## 2018-02-23 NOTE — ED Notes (Signed)
Antibiotic is 50% complete. Patient will be discharged once antibiotic is complete.

## 2018-02-23 NOTE — Discharge Instructions (Signed)
Avoid nuts or seeds.  Need to return if your symptoms worsen despite the antibiotics and you develop vomiting, fever or change in mental status.

## 2018-02-23 NOTE — ED Notes (Signed)
ED Provider at bedside. 

## 2018-02-23 NOTE — ED Provider Notes (Signed)
[place] DEPT Provider Note   CSN: [id number] Arrival date & time: 02/23/18  0542     History   Chief Complaint Chief Complaint  Patient presents with  . Abdominal Pain    HPI Jamie George is a 73 y.o. female.  The history is provided by the patient. History limited by: slightly limited due to dementia.  Abdominal Pain   This is a new problem. The current episode started 2 days ago. The problem occurs constantly. The problem has been gradually worsening. The pain is associated with an unknown factor. The pain is located in the LLQ and suprapubic region (no radiation of pain). The quality of the pain is pressure-like, sharp and shooting. The pain is at a severity of 7/10. The pain is moderate. Associated symptoms include anorexia, nausea and dysuria. Pertinent negatives include fever, diarrhea, vomiting and constipation. The symptoms are aggravated by palpation. Nothing relieves the symptoms. Past medical history comments: hx of duodenitis, migraines, mild dementia, ckd.    Past Medical History:  Diagnosis Date  . Cancer ([place])    skin  . Complication of anesthesia    " difficult to wake me up "  . Depression   . Duodenitis   . GERD (gastroesophageal reflux disease)   . Insomnia   . Migraines   . Mild dementia [facility])     Patient Active Problem List   Diagnosis Date Noted  . Dementia ([place]) 01/02/2018  . Aggression 01/02/2018  . Dissection of abdominal aorta ([place]) 08/13/2017  . Chest pain 08/13/2017  . Hypertension 08/13/2017  . CKD (chronic kidney disease) 08/13/2017  . Anemia 08/13/2017    Past Surgical History:  Procedure Laterality Date  . ABDOMINAL HYSTERECTOMY    . BACK SURGERY    . BRAIN SURGERY  2013   meningioma  . SHOULDER SURGERY       OB History   No obstetric history on file.      Home Medications    Prior to Admission medications   Medication Sig Start Date End Date Taking? Authorizing Provider    acetaminophen (TYLENOL) 325 MG tablet Take 2 tablets (650 mg total) by mouth every 6 (six) hours as needed for mild pain, fever or headache (or Fever >/= 101). 08/14/17   Roxan Hockey, MD  aspirin EC 81 MG EC tablet Take 1 tablet (81 mg total) by mouth daily. With food 08/15/17   Roxan Hockey, MD  atorvastatin (LIPITOR) 20 MG tablet Take 20 mg by mouth 2 (two) times a week. Sundays and Thursdays    [provider]  BESIVANCE 0.6 % SUSP Place 1 drop into the right eye 3 (three) times daily.  12/14/17   [provider]  DUREZOL 0.05 % EMUL Place 1 drop into the right eye 3 (three) times daily.  12/13/17   [provider]  escitalopram (LEXAPRO) 20 MG tablet Take 20 mg by mouth at bedtime.  05/03/17   [provider]  memantine (NAMENDA) 10 MG tablet Take 1 tablet (10 mg total) by mouth 2 (two) times daily. 12/17/17   Penumalli, Earlean Polka, MD  ondansetron (ZOFRAN) 4 MG tablet Take 1 tablet (4 mg total) by mouth every 6 (six) hours as needed for nausea. 08/14/17   Roxan Hockey, MD  pantoprazole (PROTONIX) 40 MG tablet Take 1 tablet (40 mg total) by mouth 2 (two) times daily before a meal. 08/14/17 08/14/18  Emokpae, Courage, MD  PROLENSA 0.07 % SOLN Place 1 drop into the  right eye daily.  12/12/17   [provider]  QUEtiapine (SEROQUEL) 25 MG tablet Take 1 tablet (25 mg total) by mouth 2 (two) times daily as needed. 12/20/17   Penumalli, Earlean Polka, MD  traZODone (DESYREL) 50 MG tablet Take 50 mg by mouth at bedtime.  05/03/17   [provider]  verapamil (CALAN-SR) 120 MG CR tablet Take 120 mg by mouth daily.    [provider]  zonisamide (ZONEGRAN) 25 MG capsule Take 100 mg by mouth at bedtime.    [provider]    Family History Family History  Problem Relation Age of Onset  . Cancer Mother   . Stroke Father   . Heart disease Father     Social History Social History   Tobacco Use  . Smoking status: Former Research scientist (life sciences)   . Smokeless tobacco: Former Network engineer Use Topics  . Alcohol use: Not Currently  . Drug use: Never     Allergies   Patient has no known allergies.   Review of Systems Review of Systems  Constitutional: Negative for fever.  Gastrointestinal: Positive for abdominal pain, anorexia and nausea. Negative for constipation, diarrhea and vomiting.  Genitourinary: Positive for dysuria.  All other systems reviewed and are negative.    Physical Exam Updated Vital Signs BP (!) 154/74 (BP Location: Left Arm)   Pulse 62   Temp 98.4 F (36.9 C) (Oral)   Resp 17   SpO2 99%   Physical Exam Vitals signs and nursing note reviewed.  Constitutional:      General: She is not in acute distress.    Appearance: She is well-developed.  HENT:     Head: Normocephalic and atraumatic.  Eyes:     Pupils: Pupils are equal, round, and reactive to light.  Cardiovascular:     Rate and Rhythm: Normal rate and regular rhythm.     Heart sounds: Normal heart sounds. No murmur. No friction rub.  Pulmonary:     Effort: Pulmonary effort is normal.     Breath sounds: Normal breath sounds. No wheezing or rales.  Abdominal:     General: Bowel sounds are normal. There is no distension.     Palpations: Abdomen is soft.     Tenderness: There is abdominal tenderness in the suprapubic area and left lower quadrant. There is guarding. There is no right CVA tenderness or rebound.  Musculoskeletal: Normal range of motion.        General: No tenderness.     Comments: No edema  Skin:    General: Skin is warm and dry.     Findings: No rash.  Neurological:     Mental Status: She is alert and oriented to person, place, and time.     Cranial Nerves: No cranial nerve deficit.  Psychiatric:        Behavior: Behavior normal.      ED Treatments / Results  Labs (all labs ordered are listed, but only abnormal results are displayed) Labs Reviewed  COMPREHENSIVE METABOLIC PANEL - Abnormal; Notable for the  following components:      Result Value   Glucose, Bld 101 (*)    Creatinine, Ser 1.34 (*)    Total Protein 6.3 (*)    Albumin 3.3 (*)    AST 11 (*)    GFR calc non Af Amer 39 (*)    GFR calc Af Amer 45 (*)    All other components within normal limits  CBC - Abnormal; Notable for  the following components:   WBC 13.9 (*)    RBC 5.29 (*)    HCT 47.9 (*)    RDW 15.9 (*)    All other components within normal limits  LIPASE, BLOOD  URINALYSIS, ROUTINE W REFLEX MICROSCOPIC    EKG None  Radiology Ct Abdomen Pelvis W Contrast  Addendum Date: 02/23/2018   ADDENDUM REPORT: 02/23/2018 09:45 ADDENDUM: Please add the following to the impression IMPRESSION: The patient has a striated nephrogram. This can be seen with hypotension or acute tubular necrosis. Correlate clinically. Electronically Signed   By: Marybelle Killings M.D.   On: 02/23/2018 09:45   Result Date: 02/23/2018 CLINICAL DATA:  Left lower abdominal pain EXAM: CT ABDOMEN AND PELVIS WITH CONTRAST TECHNIQUE: Multidetector CT imaging of the abdomen and pelvis was performed using the standard protocol following bolus administration of intravenous contrast. CONTRAST:  69mL ISOVUE-300 IOPAMIDOL (ISOVUE-300) INJECTION 61% COMPARISON:  06/14/2017 FINDINGS: Lower chest: Scarring at the base of the right middle lobe is stable. Hepatobiliary: Unremarkable Pancreas: There is a 10 mm cyst containing a large calcific density within the head of the pancreas that is stable. There is dilatation of the pancreatic duct measuring up to 5 mm. Spleen: Unremarkable Adrenals/Urinary Tract: Adrenal glands are within normal limits. Nephrogram phase of the kidneys has a striated appearance. No solid mass. No hydronephrosis. Unremarkable bladder. Stomach/Bowel: Prominent stool burden throughout the colon. The rectum is distended with stool. There is wall thickening of the proximal sigmoid colon. The wall is hazy and ill-defined. There is stranding in the adjacent fat. There  is no extraluminal bowel gas or abscess formation. Diverticulosis is also present. Mild small bowel distension is present. This likely reflects mild ileus or prominent stool burden. Stomach is decompressed. Vascular/Lymphatic: Prominent atherosclerotic changes of the aorta are present without significant focal narrowing or aneurysmal dilatation. Atherosclerotic calcifications are also present. Reproductive: Uterus is absent. Other: Small amount of free fluid is layering in the right hemipelvis. Musculoskeletal: No vertebral compression deformity. Mild levoscoliosis of the lumbar spine. IMPRESSION: There is wall thickening and inflammatory change in the proximal sigmoid colon most consistent with acute diverticulitis. Malignancy can have a similar appearance. There is no evidence of abscess or perforation. Prominent stool burden throughout the colon. 10 mm cyst containing a calcification in the head of the pancreas is associated with pancreatic ductal dilatation. Neoplasm is not excluded. Consider further imaging to better characterize. Electronically Signed: By: Marybelle Killings M.D. On: 02/23/2018 09:29    Procedures Procedures (including critical care time)  Medications Ordered in ED Medications  ondansetron (ZOFRAN-ODT) disintegrating tablet 4 mg (has no administration in time range)  morphine 4 MG/ML injection 4 mg (has no administration in time range)  ondansetron (ZOFRAN) injection 4 mg (has no administration in time range)     Initial Impression / Assessment and Plan / ED Course  I have reviewed the triage vital signs and the nursing notes.  Pertinent labs & imaging results that were available during my care of the patient were reviewed by me and considered in my medical decision making (see chart for details).     Patient is an elderly female presenting today with 2 days of lower abdominal pain.  Patient has suprapubic and left lower quadrant pain on exam.  She denies any respiratory or  cardiac symptoms.  She is well-appearing on exam with only mild hypertension by vital signs.  Patient's labs are significant for leukocytosis of almost 14,000, stable creatinine, hemoglobin.  LFTs and lipase  are within normal limits.  UA with concern for infection with large leukocytes and greater than 50 white blood cells but only rare bacteria so we will do CT to rule out diverticulitis.  Patient given pain control and IV fluids.  9:55 AM CT is consistent with diverticulitis as well as prominent stool burden.  Patient does have a mass in her pancreatic head that they recommended further evaluation for.  Patient was treated with IV antibiotics.  Pain is controlled and there is no evidence of perforation.  Final Clinical Impressions(s) / ED Diagnoses   Final diagnoses:  Diverticulitis  Constipation, unspecified constipation type  Pancreatic cyst    ED Discharge Orders         Ordered    polyethylene glycol powder (MIRALAX) powder   Once    Note to Pharmacy:  Take 1 scoop in 8oz of water 1-2 times a day to ensure daily bowel movements while taking pain medication for diverticulitis   02/23/18 1113    ciprofloxacin (CIPRO) 500 MG tablet  2 times daily     02/23/18 1113    metroNIDAZOLE (FLAGYL) 500 MG tablet  2 times daily     02/23/18 1113    HYDROcodone-acetaminophen (NORCO/VICODIN) 5-325 MG tablet  Every 6 hours PRN     02/23/18 1113           Blanchie Dessert, MD 02/23/18 1114

## 2018-02-25 DIAGNOSIS — R2681 Unsteadiness on feet: Secondary | ICD-10-CM | POA: Diagnosis not present

## 2018-02-25 DIAGNOSIS — M6281 Muscle weakness (generalized): Secondary | ICD-10-CM | POA: Diagnosis not present

## 2018-02-25 DIAGNOSIS — R488 Other symbolic dysfunctions: Secondary | ICD-10-CM | POA: Diagnosis not present

## 2018-02-27 DIAGNOSIS — M6281 Muscle weakness (generalized): Secondary | ICD-10-CM | POA: Diagnosis not present

## 2018-02-27 DIAGNOSIS — R2681 Unsteadiness on feet: Secondary | ICD-10-CM | POA: Diagnosis not present

## 2018-02-27 DIAGNOSIS — R488 Other symbolic dysfunctions: Secondary | ICD-10-CM | POA: Diagnosis not present

## 2018-02-28 ENCOUNTER — Encounter (HOSPITAL_COMMUNITY): Payer: Self-pay | Admitting: *Deleted

## 2018-02-28 ENCOUNTER — Other Ambulatory Visit: Payer: Self-pay

## 2018-02-28 DIAGNOSIS — M6281 Muscle weakness (generalized): Secondary | ICD-10-CM | POA: Diagnosis not present

## 2018-02-28 DIAGNOSIS — R488 Other symbolic dysfunctions: Secondary | ICD-10-CM | POA: Diagnosis not present

## 2018-02-28 DIAGNOSIS — R2681 Unsteadiness on feet: Secondary | ICD-10-CM | POA: Diagnosis not present

## 2018-02-28 NOTE — Progress Notes (Signed)
Spoke with daughter who will be transportation.  Patient lives at [facility] in [place].  Spoke with nurse, Karena Addison , and faxed to [facility] preop instructoins.

## 2018-02-28 NOTE — Progress Notes (Signed)
Preop instructions for:  Viana Sleep                        Date of Birth: 24-Jan-1946                            Date of Procedure:03/04/2018       Doctor: Alessandra Bevels  Time to arrive at [facility]: [id number] Report to: Admitting  Procedure:Push enteroscopy  Any procedure time changes, MD office will notify you!   Do not eat or drink past midnight the night before your procedure.(To include any tube feedings-must be discontinued) Reminder:Follow bowel prep instructions per MD office!   Take these morning medications only with sips of water.(or give through gastrostomy or feeding tube). Namenda ( if takes in am , Protonix ( if takes in am ) , Calan SR ( if takes in am )      Facility contact: [employer]                   Phone:                  [place]:  Transportation contact phone#: Daughter to bring  Please send day of procedure:current med list and meds last taken that day, confirm nothing by mouth status from what time, Patient Demographic info( to include DNR status, problem list, allergies)   RN contact name/phone#:   [employer]                          and Fax #:  Herbalist card and picture ID Leave all jewelry and other valuables at place where living( no metal or rings to be worn) No contact lens Women-no make-up, no lotions,perfumes,powders   Any questions day of procedure,call Endoscopy [phone]!   Sent from :[facility]                   [place]                   [phone]  Sent by :Gillian Shields RN

## 2018-03-04 ENCOUNTER — Encounter (HOSPITAL_COMMUNITY)
Admission: RE | Disposition: A | Discharge status: Home / Self Care | Payer: Self-pay | Source: Home / Self Care | Attending: Gastroenterology

## 2018-03-04 ENCOUNTER — Ambulatory Visit (HOSPITAL_COMMUNITY): Payer: Medicare PPO | Admitting: Anesthesiology

## 2018-03-04 ENCOUNTER — Encounter (HOSPITAL_COMMUNITY): Payer: Self-pay | Admitting: Emergency Medicine

## 2018-03-04 ENCOUNTER — Other Ambulatory Visit: Payer: Self-pay

## 2018-03-04 ENCOUNTER — Ambulatory Visit (HOSPITAL_COMMUNITY)
Admission: RE | Admit: 2018-03-04 | Discharge: 2018-03-04 | Disposition: A | Discharge status: Home / Self Care | Payer: Medicare PPO | Attending: Gastroenterology | Admitting: Gastroenterology

## 2018-03-04 DIAGNOSIS — G4700 Insomnia, unspecified: Secondary | ICD-10-CM | POA: Diagnosis not present

## 2018-03-04 DIAGNOSIS — Z79899 Other long term (current) drug therapy: Secondary | ICD-10-CM | POA: Insufficient documentation

## 2018-03-04 DIAGNOSIS — R488 Other symbolic dysfunctions: Secondary | ICD-10-CM | POA: Diagnosis not present

## 2018-03-04 DIAGNOSIS — Z7982 Long term (current) use of aspirin: Secondary | ICD-10-CM | POA: Insufficient documentation

## 2018-03-04 DIAGNOSIS — K2970 Gastritis, unspecified, without bleeding: Secondary | ICD-10-CM | POA: Diagnosis not present

## 2018-03-04 DIAGNOSIS — M6281 Muscle weakness (generalized): Secondary | ICD-10-CM | POA: Diagnosis not present

## 2018-03-04 DIAGNOSIS — F329 Major depressive disorder, single episode, unspecified: Secondary | ICD-10-CM | POA: Diagnosis not present

## 2018-03-04 DIAGNOSIS — K317 Polyp of stomach and duodenum: Secondary | ICD-10-CM | POA: Diagnosis not present

## 2018-03-04 DIAGNOSIS — Z87891 Personal history of nicotine dependence: Secondary | ICD-10-CM | POA: Insufficient documentation

## 2018-03-04 DIAGNOSIS — K219 Gastro-esophageal reflux disease without esophagitis: Secondary | ICD-10-CM | POA: Diagnosis not present

## 2018-03-04 DIAGNOSIS — K259 Gastric ulcer, unspecified as acute or chronic, without hemorrhage or perforation: Secondary | ICD-10-CM | POA: Diagnosis not present

## 2018-03-04 DIAGNOSIS — F0390 Unspecified dementia without behavioral disturbance: Secondary | ICD-10-CM | POA: Diagnosis not present

## 2018-03-04 DIAGNOSIS — R2681 Unsteadiness on feet: Secondary | ICD-10-CM | POA: Diagnosis not present

## 2018-03-04 DIAGNOSIS — R933 Abnormal findings on diagnostic imaging of other parts of digestive tract: Secondary | ICD-10-CM | POA: Diagnosis not present

## 2018-03-04 HISTORY — PX: BIOPSY: SHX5522

## 2018-03-04 HISTORY — PX: ENTEROSCOPY: SHX5533

## 2018-03-04 SURGERY — ENTEROSCOPY
Anesthesia: Monitor Anesthesia Care

## 2018-03-04 MED ORDER — LIDOCAINE 2% (20 MG/ML) 5 ML SYRINGE
INTRAMUSCULAR | Status: DC | PRN
  Administered 2018-03-04: 100 mg via INTRAVENOUS

## 2018-03-04 MED ORDER — PROPOFOL 10 MG/ML IV BOLUS
INTRAVENOUS | Status: DC | PRN
  Administered 2018-03-04: 20 mg via INTRAVENOUS

## 2018-03-04 MED ORDER — PROPOFOL 10 MG/ML IV BOLUS
INTRAVENOUS | Status: AC
  Filled 2018-03-04: qty 60

## 2018-03-04 MED ORDER — FENTANYL CITRATE (PF) 100 MCG/2ML IJ SOLN
INTRAMUSCULAR | Status: DC | PRN
  Administered 2018-03-04: 25 ug via INTRAVENOUS

## 2018-03-04 MED ORDER — LACTATED RINGERS IV SOLN
INTRAVENOUS | Status: DC
  Administered 2018-03-04: 08:00:00 via INTRAVENOUS

## 2018-03-04 MED ORDER — SODIUM CHLORIDE 0.9 % IV SOLN: INTRAVENOUS | Status: DC

## 2018-03-04 MED ORDER — PROPOFOL 500 MG/50ML IV EMUL
INTRAVENOUS | Status: DC | PRN
  Administered 2018-03-04: 120 ug/kg/min via INTRAVENOUS

## 2018-03-04 MED ORDER — FENTANYL CITRATE (PF) 100 MCG/2ML IJ SOLN
INTRAMUSCULAR | Status: AC
  Filled 2018-03-04: qty 2

## 2018-03-04 NOTE — Anesthesia Procedure Notes (Signed)
Date/Time: 03/04/2018 8:40 AM Performed by: Sharlette Dense, CRNA Oxygen Delivery Method: Nasal cannula

## 2018-03-04 NOTE — Op Note (Signed)
[facility] Patient Name: Jamie George Procedure Date: 03/04/2018 MRN: [id number] Attending MD: Otis Brace , MD Date of Birth: [birth year] CSN: [id number] Age: 73 Admit Type: Outpatient Procedure:                Small bowel enteroscopy Indications:              Abnormal abdominal CT Providers:                Otis Brace, MD, Cleda Daub, RN, Tinnie Gens, Technician, Danley Danker, CRNA Referring MD:              Medicines:                Sedation Administered by an Anesthesia Professional Complications:            No immediate complications. Estimated Blood Loss:     Estimated blood loss was minimal. Estimated blood                            loss was minimal. Procedure:                Pre-Anesthesia Assessment:                           - Prior to the procedure, a History and Physical                            was performed, and patient medications and                            allergies were reviewed. The patient's tolerance of                            previous anesthesia was also reviewed. The risks                            and benefits of the procedure and the sedation                            options and risks were discussed with the patient.                            All questions were answered, and informed consent                            was obtained. Prior Anticoagulants: The patient has                            taken no previous anticoagulant or antiplatelet                            agents. ASA Grade Assessment: II - A patient with  mild systemic disease. After reviewing the risks                            and benefits, the patient was deemed in                            satisfactory condition to undergo the procedure.                           After obtaining informed consent, the endoscope was                            passed under direct vision. Throughout the                     procedure, the patient's blood pressure, pulse, and                            oxygen saturations were monitored continuously. The                            PCF-H190DL ([id number]) Olympus peds colonscope was                            introduced through the mouth and advanced to the                            jejunum, to the 110 cm mark (from the incisors).                            The small bowel enteroscopy was accomplished                            without difficulty. The patient tolerated the                            procedure well. Scope In: Scope Out: Findings:      The Z-line was regular and was found 40 cm from the incisors.      The exam of the esophagus was otherwise normal.      Scattered mild inflammation was found in the gastric antrum. Biopsies       were taken with a cold forceps for histology.      Two small sessile polyps were found in the gastric fundus. Biopsies were       taken with a cold forceps for histology.      The cardia and gastric fundus were normal on retroflexion.      The exam of the duodenum was otherwise normal.      Normal mucosa was found in the Proximal jejunum. No evidence of ulcer or       bleeding. Impression:               - Z-line regular, 40 cm from the incisors.                           - Gastritis. Biopsied.                           -  Two gastric polyps. Biopsied.                           - Normal mucosa was found in the jejunum. Recommendation:           - Patient has a contact number available for                            emergencies. The signs and symptoms of potential                            delayed complications were discussed with the                            patient. Return to normal activities tomorrow.                            Written discharge instructions were provided to the                            patient.                           - Resume previous diet.                           - Continue  present medications.                           - Await pathology results.                           - Return to my office in 2 months. Procedure Code(s):        --- Professional ---                           [phone], Small intestinal endoscopy, enteroscopy                            beyond second portion of duodenum, not including                            ileum; with biopsy, single or multiple Diagnosis Code(s):        --- Professional ---                           K29.70, Gastritis, unspecified, without bleeding                           K31.7, Polyp of stomach and duodenum                           R93.3, Abnormal findings on diagnostic imaging of                            other parts of digestive tract CPT copyright 2018 American Medical Association.  All rights reserved. The codes documented in this report are preliminary and upon coder review may  be revised to meet current compliance requirements. Otis Brace, MD Otis Brace, MD 03/04/2018 9:04:44 AM Number of Addenda: 0

## 2018-03-04 NOTE — H&P (Signed)
Primary Care Physician:  Marda Stalker, PA-C Primary Gastroenterologist:  Dr. Alessandra Bevels  Reason for Visit : Dark stools, abnormal CT scan  HPI: Jamie George is a 73 y.o. female presented today for push enteroscopy for further evaluation of dark stools and abnormal CT scan in April 2019 concerning for duodenitis.  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   denying any acute GI symptoms today.  She denies blood in the stool or black stool.  She was diagnosed with possible sigmoid diverticulitis earlier this month but she is asymptomatic now.  She is complaining of issues with sleep pattern at this time.  Recent blood work in January 2020 showed normal hemoglobin.  Colonoscopy in 2011 at [facility] which was normal.  Past Medical History:  Diagnosis Date  . Cancer ([place])    skin  . Complication of anesthesia    " difficult to wake me up "  . Depression   . Duodenitis   . GERD (gastroesophageal reflux disease)   . Insomnia   . Migraines   . Mild dementia [facility])     Past Surgical History:  Procedure Laterality Date  . ABDOMINAL HYSTERECTOMY    . BACK SURGERY    . BRAIN SURGERY  2013   meningioma  . SHOULDER SURGERY      Prior to Admission medications   Medication Sig Start Date End Date Taking? Authorizing Provider  acetaminophen (TYLENOL) 500 MG tablet Take 1 tablet (500 mg total) by mouth every 6 (six) hours as needed for moderate pain (try tylenol before narcotic pain medication for pain). 02/23/18  Yes Blanchie Dessert, MD  aspirin EC 81 MG EC tablet Take 1 tablet (81 mg total) by mouth daily. With food 08/15/17  Yes Emokpae, Courage, MD  atorvastatin (LIPITOR) 20 MG tablet Take 20 mg by mouth 2 (two) times a week. Sundays and Thursdays   Yes [provider]  BESIVANCE 0.6 % SUSP Place 1 drop into the right eye 3 (three) times daily.   12/14/17  Yes [provider]  ciprofloxacin (CIPRO) 500 MG tablet Take 1 tablet (500 mg total) by mouth 2 (two) times daily. 02/23/18  Yes Plunkett, Loree Fee, MD  escitalopram (LEXAPRO) 20 MG tablet Take 20 mg by mouth at bedtime.  05/03/17  Yes [provider]  memantine (NAMENDA) 10 MG tablet Take 1 tablet (10 mg total) by mouth 2 (two) times daily. 12/17/17  Yes Penumalli, Earlean Polka, MD  metroNIDAZOLE (FLAGYL) 500 MG tablet Take 1 tablet (500 mg total) by mouth 2 (two) times daily. 02/23/18  Yes Blanchie Dessert, MD  traZODone (DESYREL) 50 MG tablet Take 50 mg by mouth at bedtime.  05/03/17  Yes [provider]  verapamil (CALAN-SR) 120 MG CR tablet Take 120 mg by mouth daily.   Yes [provider]  zonisamide (ZONEGRAN) 25 MG capsule Take 100 mg by mouth at bedtime.   Yes [provider]  DUREZOL 0.05 % EMUL Place 1 drop into the right eye 3 (three) times daily.  12/13/17   [provider]  HYDROcodone-acetaminophen (NORCO/VICODIN) 5-325 MG tablet Take 1 tablet by mouth every 6 (six) hours as needed for severe pain. 02/23/18   Blanchie Dessert, MD  ondansetron (ZOFRAN) 4 MG tablet Take 1 tablet (4 mg total) by mouth every 6 (six) hours as needed for nausea. 08/14/17   Roxan Hockey, MD  pantoprazole (PROTONIX) 40 MG tablet Take 1 tablet (40 mg total) by mouth 2 (two) times daily before a meal. 08/14/17 08/14/18  Emokpae, Courage, MD  PROLENSA 0.07 % SOLN Place 1 drop into the right eye daily.  12/12/17   [provider]  QUEtiapine (SEROQUEL) 25 MG tablet Take 1 tablet (25 mg total) by mouth 2 (two) times daily as needed. 12/20/17   Penumalli, Earlean Polka, MD    Scheduled Meds: Continuous Infusions: . sodium chloride    . sodium chloride    . lactated ringers     PRN Meds:.  Allergies as of 12/26/2017  . (No Known Allergies)    Family History  Problem Relation Age of Onset  . Cancer Mother   . Stroke Father   . Heart disease  Father     Social History   Socioeconomic History  . Marital status: Widowed    Spouse name: Not on file  . Number of children: 2  . Years of education: 40  . Highest education level: Not on file  Occupational History    Comment: retired  Scientific laboratory technician  . Financial resource strain: Not on file  . Food insecurity:    Worry: Not on file    Inability: Not on file  . Transportation needs:    Medical: Not on file    Non-medical: Not on file  Tobacco Use  . Smoking status: Former Research scientist (life sciences)  . Smokeless tobacco: Former Network engineer and Sexual Activity  . Alcohol use: Not Currently  . Drug use: Never  . Sexual activity: Not Currently  Lifestyle  . Physical activity:    Days per week: Not on file    Minutes per session: Not on file  . Stress: Not on file  Relationships  . Social connections:    Talks on phone: Not on file    Gets together: Not on file    Attends religious service: Not on file    Active member of club or organization: Not on file    Attends meetings of clubs or organizations: Not on file    Relationship status: Not on file  . Intimate partner violence:    Fear of current or ex partner: Not on file    Emotionally abused: Not on file    Physically abused: Not on file    Forced sexual activity: Not on file  Other Topics Concern  . Not on file  Social History Narrative   12/17/17 Lives alone in home.  Retired.  Widowed.  Children 2 (daughter, [name]).     Caffeine some.     Review of Systems: All negative except as stated above in HPI.  Physical Exam: Vital signs: Vitals:   03/04/18 0749  BP: (!) 161/66  Pulse: 75  Resp: 12  Temp: 98.1 F (36.7 C)  SpO2: 95%     General:   Alert,  Well-developed, well-nourished, pleasant and cooperative in NAD Lungs:  Clear throughout to auscultation.   No wheezes, crackles, or rhonchi. No acute distress. Heart:  Regular rate and rhythm; no murmurs, clicks, rubs,  or gallops. Abdomen: Soft, nontender,  nondistended, bowel sounds present Rectal:  Deferred  GI:  Lab Results: No results for input(s): WBC, HGB, HCT, PLT in the last 72 hours. BMET No results for input(s): NA, K, CL, CO2, GLUCOSE, BUN, CREATININE, CALCIUM in the last 72 hours. LFT No results for input(s): PROT, ALBUMIN, AST, ALT, ALKPHOS, BILITOT, BILIDIR, IBILI in the last 72 hours. PT/INR No  results for input(s): LABPROT, INR in the last 72 hours.   Studies/Results: No results found.  Impression/Plan: History of abnormal CT scan and dark stools.  CT scan concerning for duodenitis. 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   Recommendations ------------------------ -Push enteroscopy today.  Risks (bleeding, infection, bowel perforation that could require surgery, sedation-related changes in cardiopulmonary systems), benefits (identification and possible treatment of source of symptoms, exclusion of certain causes of symptoms), and alternatives (watchful waiting, radiographic imaging studies, empiric medical treatment)  were explained to patient and family in detail and patient wishes to proceed.    LOS: 0 days   Otis Brace  MD, FACP 03/04/2018, 8:18 AM  Contact #  [phone]

## 2018-03-04 NOTE — Anesthesia Postprocedure Evaluation (Signed)
Anesthesia Post Note  Patient: Jamie George  Procedure(s) Performed: PUSH ENTEROSCOPY (N/A ) BIOPSY     Patient location during evaluation: Endoscopy Anesthesia Type: MAC Level of consciousness: awake and alert Pain management: pain level controlled Vital Signs Assessment: post-procedure vital signs reviewed and stable Respiratory status: spontaneous breathing, nonlabored ventilation, respiratory function stable and patient connected to nasal cannula oxygen Cardiovascular status: stable and blood pressure returned to baseline Postop Assessment: no apparent nausea or vomiting Anesthetic complications: no    Last Vitals:  Vitals:   03/04/18 0915 03/04/18 0919  BP: (!) 155/72 (!) 171/69  Pulse: 63 71  Resp: 18 14  Temp:    SpO2: 99% 98%    Last Pain:  Vitals:   03/04/18 0915  TempSrc:   PainSc: 0-No pain                 Montez Hageman

## 2018-03-04 NOTE — Discharge Instructions (Signed)
Upper Endoscopy, Adult, Care After °This sheet gives you information about how to care for yourself after your procedure. Your health care provider may also give you more specific instructions. If you have problems or questions, contact your health care provider. °What can I expect after the procedure? °After the procedure, it is common to have: °· A sore throat. °· Mild stomach pain or discomfort. °· Bloating. °· Nausea. °Follow these instructions at home: ° °· Follow instructions from your health care provider about what to eat or drink after your procedure. °· Return to your normal activities as told by your health care provider. Ask your health care provider what activities are safe for you. °· Take over-the-counter and prescription medicines only as told by your health care provider. °· Do not drive for 24 hours if you were given a sedative during your procedure. °· Keep all follow-up visits as told by your health care provider. This is important. °Contact a health care provider if you have: °· A sore throat that lasts longer than one day. °· Trouble swallowing. °Get help right away if: °· You vomit blood or your vomit looks like coffee grounds. °· You have: °? A fever. °? Bloody, black, or tarry stools. °? A severe sore throat or you cannot swallow. °? Difficulty breathing. °? Severe pain in your chest or abdomen. °Summary °· After the procedure, it is common to have a sore throat, mild stomach discomfort, bloating, and nausea. °· Do not drive for 24 hours if you were given a sedative during the procedure. °· Follow instructions from your health care provider about what to eat or drink after your procedure. °· Return to your normal activities as told by your health care provider. °This information is not intended to replace advice given to you by your health care provider. Make sure you discuss any questions you have with your health care provider. °Document Released: 08/08/2011 Document Revised: 07/09/2017  Document Reviewed: 07/09/2017 °Elsevier Interactive Patient Education © 2019 [employer] Inc[employer] ° °

## 2018-03-04 NOTE — Anesthesia Preprocedure Evaluation (Signed)
Anesthesia Evaluation  Patient identified by MRN, date of birth, ID band Patient awake    Reviewed: Allergy & Precautions, NPO status , Patient's Chart, lab work & pertinent test results  History of Anesthesia Complications (+) PROLONGED EMERGENCE  Airway Mallampati: II  TM Distance: >3 FB Neck ROM: Full    Dental no notable dental hx.    Pulmonary neg pulmonary ROS, former smoker,    Pulmonary exam normal breath sounds clear to auscultation       Cardiovascular negative cardio ROS Normal cardiovascular exam Rhythm:Regular Rate:Normal     Neuro/Psych Dementia negative neurological ROS  negative psych ROS   GI/Hepatic Neg liver ROS, GERD  ,  Endo/Other  negative endocrine ROS  Renal/GU negative Renal ROS  negative genitourinary   Musculoskeletal negative musculoskeletal ROS (+)   Abdominal   Peds negative pediatric ROS (+)  Hematology negative hematology ROS (+)   Anesthesia Other Findings   Reproductive/Obstetrics negative OB ROS                             Anesthesia Physical Anesthesia Plan  ASA: II  Anesthesia Plan: MAC   Post-op Pain Management:    Induction:   PONV Risk Score and Plan: 2 and Treatment may vary due to age or medical condition  Airway Management Planned: Nasal Cannula  Additional Equipment:   Intra-op Plan:   Post-operative Plan:   Informed Consent: I have reviewed the patients History and Physical, chart, labs and discussed the procedure including the risks, benefits and alternatives for the proposed anesthesia with the patient or authorized representative who has indicated his/her understanding and acceptance.   Dental advisory given  Plan Discussed with: CRNA  Anesthesia Plan Comments:         Anesthesia Quick Evaluation

## 2018-03-04 NOTE — Transfer of Care (Signed)
Immediate Anesthesia Transfer of Care Note  Patient: Jamie George  Procedure(s) Performed: PUSH ENTEROSCOPY (N/A ) BIOPSY  Patient Location: PACU and Endoscopy Unit  Anesthesia Type:MAC  Level of Consciousness: awake and drowsy  Airway & Oxygen Therapy: Patient Spontanous Breathing and Patient connected to nasal cannula oxygen  Post-op Assessment: Report given to RN and Post -op Vital signs reviewed and stable  Post vital signs: Reviewed and stable  Last Vitals:  Vitals Value Taken Time  BP    Temp    Pulse    Resp    SpO2      Last Pain:  Vitals:   03/04/18 0749  TempSrc: Oral         Complications: No apparent anesthesia complications

## 2018-03-05 ENCOUNTER — Encounter (HOSPITAL_COMMUNITY): Payer: Self-pay | Admitting: Gastroenterology

## 2018-03-05 DIAGNOSIS — R2681 Unsteadiness on feet: Secondary | ICD-10-CM | POA: Diagnosis not present

## 2018-03-05 DIAGNOSIS — R488 Other symbolic dysfunctions: Secondary | ICD-10-CM | POA: Diagnosis not present

## 2018-03-05 DIAGNOSIS — M6281 Muscle weakness (generalized): Secondary | ICD-10-CM | POA: Diagnosis not present

## 2018-03-06 DIAGNOSIS — R488 Other symbolic dysfunctions: Secondary | ICD-10-CM | POA: Diagnosis not present

## 2018-03-06 DIAGNOSIS — M6281 Muscle weakness (generalized): Secondary | ICD-10-CM | POA: Diagnosis not present

## 2018-03-06 DIAGNOSIS — R2681 Unsteadiness on feet: Secondary | ICD-10-CM | POA: Diagnosis not present

## 2018-03-07 DIAGNOSIS — M6281 Muscle weakness (generalized): Secondary | ICD-10-CM | POA: Diagnosis not present

## 2018-03-07 DIAGNOSIS — R2681 Unsteadiness on feet: Secondary | ICD-10-CM | POA: Diagnosis not present

## 2018-03-07 DIAGNOSIS — R488 Other symbolic dysfunctions: Secondary | ICD-10-CM | POA: Diagnosis not present

## 2018-03-12 DIAGNOSIS — R2681 Unsteadiness on feet: Secondary | ICD-10-CM | POA: Diagnosis not present

## 2018-03-12 DIAGNOSIS — M6281 Muscle weakness (generalized): Secondary | ICD-10-CM | POA: Diagnosis not present

## 2018-03-12 DIAGNOSIS — R488 Other symbolic dysfunctions: Secondary | ICD-10-CM | POA: Diagnosis not present

## 2018-03-19 DIAGNOSIS — R2681 Unsteadiness on feet: Secondary | ICD-10-CM | POA: Diagnosis not present

## 2018-03-19 DIAGNOSIS — R488 Other symbolic dysfunctions: Secondary | ICD-10-CM | POA: Diagnosis not present

## 2018-03-19 DIAGNOSIS — M6281 Muscle weakness (generalized): Secondary | ICD-10-CM | POA: Diagnosis not present

## 2018-03-20 DIAGNOSIS — R2681 Unsteadiness on feet: Secondary | ICD-10-CM | POA: Diagnosis not present

## 2018-03-20 DIAGNOSIS — R488 Other symbolic dysfunctions: Secondary | ICD-10-CM | POA: Diagnosis not present

## 2018-03-20 DIAGNOSIS — M6281 Muscle weakness (generalized): Secondary | ICD-10-CM | POA: Diagnosis not present

## 2018-03-21 DIAGNOSIS — R488 Other symbolic dysfunctions: Secondary | ICD-10-CM | POA: Diagnosis not present

## 2018-03-21 DIAGNOSIS — M6281 Muscle weakness (generalized): Secondary | ICD-10-CM | POA: Diagnosis not present

## 2018-03-21 DIAGNOSIS — R2681 Unsteadiness on feet: Secondary | ICD-10-CM | POA: Diagnosis not present

## 2018-03-26 DIAGNOSIS — M6281 Muscle weakness (generalized): Secondary | ICD-10-CM | POA: Diagnosis not present

## 2018-03-26 DIAGNOSIS — R2681 Unsteadiness on feet: Secondary | ICD-10-CM | POA: Diagnosis not present

## 2018-03-26 DIAGNOSIS — R41841 Cognitive communication deficit: Secondary | ICD-10-CM | POA: Diagnosis not present

## 2018-03-26 DIAGNOSIS — R488 Other symbolic dysfunctions: Secondary | ICD-10-CM | POA: Diagnosis not present

## 2018-03-27 DIAGNOSIS — R41841 Cognitive communication deficit: Secondary | ICD-10-CM | POA: Diagnosis not present

## 2018-03-27 DIAGNOSIS — M6281 Muscle weakness (generalized): Secondary | ICD-10-CM | POA: Diagnosis not present

## 2018-03-27 DIAGNOSIS — R488 Other symbolic dysfunctions: Secondary | ICD-10-CM | POA: Diagnosis not present

## 2018-03-27 DIAGNOSIS — R2681 Unsteadiness on feet: Secondary | ICD-10-CM | POA: Diagnosis not present

## 2018-03-29 DIAGNOSIS — R2681 Unsteadiness on feet: Secondary | ICD-10-CM | POA: Diagnosis not present

## 2018-03-29 DIAGNOSIS — M6281 Muscle weakness (generalized): Secondary | ICD-10-CM | POA: Diagnosis not present

## 2018-03-29 DIAGNOSIS — R488 Other symbolic dysfunctions: Secondary | ICD-10-CM | POA: Diagnosis not present

## 2018-03-29 DIAGNOSIS — R41841 Cognitive communication deficit: Secondary | ICD-10-CM | POA: Diagnosis not present

## 2018-03-31 ENCOUNTER — Encounter (HOSPITAL_COMMUNITY): Payer: Self-pay

## 2018-03-31 ENCOUNTER — Other Ambulatory Visit: Payer: Self-pay

## 2018-03-31 ENCOUNTER — Emergency Department (HOSPITAL_COMMUNITY): Payer: Medicare PPO

## 2018-03-31 ENCOUNTER — Emergency Department (HOSPITAL_COMMUNITY)
Admission: EM | Admit: 2018-03-31 | Discharge: 2018-03-31 | Disposition: A | Discharge status: Home / Self Care | Payer: Medicare PPO | Attending: Emergency Medicine | Admitting: Emergency Medicine

## 2018-03-31 DIAGNOSIS — Z85828 Personal history of other malignant neoplasm of skin: Secondary | ICD-10-CM | POA: Insufficient documentation

## 2018-03-31 DIAGNOSIS — M25551 Pain in right hip: Secondary | ICD-10-CM | POA: Insufficient documentation

## 2018-03-31 DIAGNOSIS — F0390 Unspecified dementia without behavioral disturbance: Secondary | ICD-10-CM | POA: Insufficient documentation

## 2018-03-31 DIAGNOSIS — N189 Chronic kidney disease, unspecified: Secondary | ICD-10-CM | POA: Diagnosis not present

## 2018-03-31 DIAGNOSIS — I129 Hypertensive chronic kidney disease with stage 1 through stage 4 chronic kidney disease, or unspecified chronic kidney disease: Secondary | ICD-10-CM | POA: Insufficient documentation

## 2018-03-31 DIAGNOSIS — R410 Disorientation, unspecified: Secondary | ICD-10-CM | POA: Diagnosis not present

## 2018-03-31 DIAGNOSIS — Z87891 Personal history of nicotine dependence: Secondary | ICD-10-CM | POA: Diagnosis not present

## 2018-03-31 DIAGNOSIS — Z79899 Other long term (current) drug therapy: Secondary | ICD-10-CM | POA: Diagnosis not present

## 2018-03-31 DIAGNOSIS — R52 Pain, unspecified: Secondary | ICD-10-CM | POA: Diagnosis not present

## 2018-03-31 DIAGNOSIS — Z7982 Long term (current) use of aspirin: Secondary | ICD-10-CM | POA: Diagnosis not present

## 2018-03-31 DIAGNOSIS — R0902 Hypoxemia: Secondary | ICD-10-CM | POA: Diagnosis not present

## 2018-03-31 DIAGNOSIS — M25572 Pain in left ankle and joints of left foot: Secondary | ICD-10-CM | POA: Diagnosis not present

## 2018-03-31 DIAGNOSIS — Z743 Need for continuous supervision: Secondary | ICD-10-CM | POA: Diagnosis not present

## 2018-03-31 DIAGNOSIS — R279 Unspecified lack of coordination: Secondary | ICD-10-CM | POA: Diagnosis not present

## 2018-03-31 LAB — CBC WITH DIFFERENTIAL/PLATELET
Abs Immature Granulocytes: 0.03 10*3/uL (ref 0.00–0.07)
Basophils Absolute: 0.1 10*3/uL (ref 0.0–0.1)
Basophils Relative: 1 %
Eosinophils Absolute: 0.2 10*3/uL (ref 0.0–0.5)
Eosinophils Relative: 2 %
HCT: 50.1 % — ABNORMAL HIGH (ref 36.0–46.0)
Hemoglobin: 15.3 g/dL — ABNORMAL HIGH (ref 12.0–15.0)
Immature Granulocytes: 0 %
Lymphocytes Relative: 17 %
Lymphs Abs: 1.6 10*3/uL (ref 0.7–4.0)
MCH: 27.9 pg (ref 26.0–34.0)
MCHC: 30.5 g/dL (ref 30.0–36.0)
MCV: 91.3 fL (ref 80.0–100.0)
Monocytes Absolute: 0.9 10*3/uL (ref 0.1–1.0)
Monocytes Relative: 10 %
Neutro Abs: 6.5 10*3/uL (ref 1.7–7.7)
Neutrophils Relative %: 70 %
Platelets: 313 10*3/uL (ref 150–400)
RBC: 5.49 MIL/uL — ABNORMAL HIGH (ref 3.87–5.11)
RDW: 16.3 % — ABNORMAL HIGH (ref 11.5–15.5)
WBC: 9.2 10*3/uL (ref 4.0–10.5)
nRBC: 0 % (ref 0.0–0.2)

## 2018-03-31 LAB — URINALYSIS, ROUTINE W REFLEX MICROSCOPIC
Bacteria, UA: NONE SEEN
Bilirubin Urine: NEGATIVE
Glucose, UA: NEGATIVE mg/dL
Ketones, ur: NEGATIVE mg/dL
Leukocytes, UA: NEGATIVE
Nitrite: NEGATIVE
Protein, ur: NEGATIVE mg/dL
Specific Gravity, Urine: 1.011 (ref 1.005–1.030)
pH: 6 (ref 5.0–8.0)

## 2018-03-31 LAB — COMPREHENSIVE METABOLIC PANEL
ALBUMIN: 3.6 g/dL (ref 3.5–5.0)
ALT: 9 U/L (ref 0–44)
AST: 13 U/L — ABNORMAL LOW (ref 15–41)
Alkaline Phosphatase: 59 U/L (ref 38–126)
Anion gap: 8 (ref 5–15)
BUN: 14 mg/dL (ref 8–23)
CO2: 28 mmol/L (ref 22–32)
Calcium: 9.4 mg/dL (ref 8.9–10.3)
Chloride: 104 mmol/L (ref 98–111)
Creatinine, Ser: 1.27 mg/dL — ABNORMAL HIGH (ref 0.44–1.00)
GFR calc Af Amer: 48 mL/min — ABNORMAL LOW (ref >60)
GFR calc non Af Amer: 42 mL/min — ABNORMAL LOW (ref >60)
GLUCOSE: 92 mg/dL (ref 70–99)
Potassium: 3.8 mmol/L (ref 3.5–5.1)
SODIUM: 140 mmol/L (ref 135–145)
Total Bilirubin: 0.6 mg/dL (ref 0.3–1.2)
Total Protein: 6.4 g/dL — ABNORMAL LOW (ref 6.5–8.1)

## 2018-03-31 MED ORDER — MORPHINE SULFATE (PF) 2 MG/ML IV SOLN
2.0000 mg | Freq: Once | INTRAVENOUS | Status: AC
  Administered 2018-03-31: 2 mg via INTRAVENOUS
  Filled 2018-03-31: qty 1

## 2018-03-31 MED ORDER — ACETAMINOPHEN ER 650 MG PO TBCR: 650.0000 mg | EXTENDED_RELEASE_TABLET | Freq: Three times a day (TID) | ORAL | 0 refills | Status: DC | PRN

## 2018-03-31 MED ORDER — OXYCODONE-ACETAMINOPHEN 5-325 MG PO TABS
2.0000 | ORAL_TABLET | Freq: Once | ORAL | Status: DC
  Filled 2018-03-31: qty 2

## 2018-03-31 MED ORDER — SODIUM CHLORIDE 0.9 % IV SOLN
1000.0000 mL | INTRAVENOUS | Status: DC
  Administered 2018-03-31: 1000 mL via INTRAVENOUS

## 2018-03-31 MED ORDER — SODIUM CHLORIDE 0.9 % IV BOLUS (SEPSIS)
500.0000 mL | Freq: Once | INTRAVENOUS | Status: AC
  Administered 2018-03-31: 500 mL via INTRAVENOUS

## 2018-03-31 NOTE — ED Provider Notes (Signed)
Medical screening examination/treatment/procedure(s) were conducted as a shared visit with non-physician practitioner(s) and myself.  I personally evaluated the patient during the encounter.  None Patient reports that the pain started about 3 days ago.  She denies any injury.  She reports however it came pretty quickly.  She indicates pain in her hip and close to her SI joint.  She reports is become so painful she cannot put any weight on it.  She is unable to walk due to pain.  She denies she has had a fall.  On examination, patient has some suprapubic lower abdominal discomfort.  She denies that she had noted that earlier.  She denies difficulty with urination but also qualifies that she has not mailed to go to the bathroom because of her pain.  Patient is alert and uncomfortable in appearance.  Nontoxic.  Heart regular lungs grossly clear.  Abdomen soft but patient endorses moderate pain to palpation over the suprapubic region.  She has reproducible discomfort from the low SI joint laterally over the greater trochanter.  Distal pulses are 2+ and symmetric.  Patient does not have any peripheral edema.  Patient can dorsiflex against resistance and can also extend against resistance with hip and knee in flexed position.  This does cause her some pain in the back and the hip but strength is intact.  At this time, will obtain screening lab work.  Patient is endorsing fairly significant suprapubic discomfort on my physical exam.  Need to rule out UTI.  Will proceed with CT to rule out occult fracture or injury to the hip as pain does seem to localize there.  Other consideration is sciatica.  Will start some pain control, follow-up on studies and reassess for gait and function.   Charlesetta Shanks, MD 03/31/18 1037

## 2018-03-31 NOTE — ED Triage Notes (Signed)
Patient presented to ed with c/o right hip pain for 2 days. No deformity noted.

## 2018-03-31 NOTE — ED Notes (Signed)
Bed: WHALD Expected date:  Expected time:  Means of arrival:  Comments: 

## 2018-03-31 NOTE — ED Notes (Signed)
Bed: WA20 Expected date:  Expected time:  Means of arrival:  Comments: 73 yo hip pain

## 2018-03-31 NOTE — ED Provider Notes (Signed)
[facility] DEPT Provider Note   CSN: [id number] Arrival date & time: 03/31/18  7591   History   Chief Complaint Chief Complaint  Patient presents with  . Hip Pain    HPI Jamie George is a 73 y.o. female with a hx of anemia, mild dementia, CKD, HTN, GERD, and abdominal aorta dissection who presents to the ED with complaints of right hip pain for the past 2 to 3 days.  Patient states pain in her hip is constant, worse with movement & attempts at ambulate, no alleviating factors. No intervention tried PTA. She states pain is to the point she feels she is unable to ambulate. She cannot recall a specific injury or change in activity that triggered this discomfort, no recent falls. Denies numbness, tingling, weakness, leg swelling, erythema, warmth, or fevers. Denies abdominal pain, chest pain, or dyspnea. No prior hip surgeries.   HPI  Past Medical History:  Diagnosis Date  . Cancer ([place])    skin  . Complication of anesthesia    " difficult to wake me up "  . Depression   . Duodenitis   . GERD (gastroesophageal reflux disease)   . Insomnia   . Migraines   . Mild dementia [facility])     Patient Active Problem List   Diagnosis Date Noted  . Dementia ([place]) 01/02/2018  . Aggression 01/02/2018  . Dissection of abdominal aorta ([place]) 08/13/2017  . Chest pain 08/13/2017  . Hypertension 08/13/2017  . CKD (chronic kidney disease) 08/13/2017  . Anemia 08/13/2017    Past Surgical History:  Procedure Laterality Date  . ABDOMINAL HYSTERECTOMY    . BACK SURGERY    . BIOPSY  03/04/2018   Procedure: BIOPSY;  Surgeon: Otis Brace, MD;  Location: [facility] ENDOSCOPY;  Service: Gastroenterology;;  . BRAIN SURGERY  2013   meningioma  . ENTEROSCOPY N/A 03/04/2018   Procedure: PUSH ENTEROSCOPY;  Surgeon: Otis Brace, MD;  Location: [facility] ENDOSCOPY;  Service: Gastroenterology;  Laterality: N/A;  . SHOULDER SURGERY       OB History   No obstetric history on  file.      Home Medications    Prior to Admission medications   Medication Sig Start Date End Date Taking? Authorizing Provider  acetaminophen (TYLENOL) 500 MG tablet Take 1 tablet (500 mg total) by mouth every 6 (six) hours as needed for moderate pain (try tylenol before narcotic pain medication for pain). 02/23/18   Blanchie Dessert, MD  aspirin EC 81 MG EC tablet Take 1 tablet (81 mg total) by mouth daily. With food 08/15/17   Roxan Hockey, MD  atorvastatin (LIPITOR) 20 MG tablet Take 20 mg by mouth 2 (two) times a week. Sundays and Thursdays    [provider]  BESIVANCE 0.6 % SUSP Place 1 drop into the right eye 3 (three) times daily.  12/14/17   [provider]  ciprofloxacin (CIPRO) 500 MG tablet Take 1 tablet (500 mg total) by mouth 2 (two) times daily. 02/23/18   Blanchie Dessert, MD  DUREZOL 0.05 % EMUL Place 1 drop into the right eye 3 (three) times daily.  12/13/17   [provider]  escitalopram (LEXAPRO) 20 MG tablet Take 20 mg by mouth at bedtime.  05/03/17   [provider]  HYDROcodone-acetaminophen (NORCO/VICODIN) 5-325 MG tablet Take 1 tablet by mouth every 6 (six) hours as needed for severe pain. 02/23/18   Blanchie Dessert, MD  memantine (NAMENDA) 10 MG tablet Take 1 tablet (10 mg  total) by mouth 2 (two) times daily. 12/17/17   Penumalli, Earlean Polka, MD  metroNIDAZOLE (FLAGYL) 500 MG tablet Take 1 tablet (500 mg total) by mouth 2 (two) times daily. 02/23/18   Blanchie Dessert, MD  ondansetron (ZOFRAN) 4 MG tablet Take 1 tablet (4 mg total) by mouth every 6 (six) hours as needed for nausea. 08/14/17   Roxan Hockey, MD  pantoprazole (PROTONIX) 40 MG tablet Take 1 tablet (40 mg total) by mouth 2 (two) times daily before a meal. 08/14/17 08/14/18  Emokpae, Courage, MD  PROLENSA 0.07 % SOLN Place 1 drop into the right eye daily.  12/12/17   [provider]  QUEtiapine (SEROQUEL) 25 MG tablet Take 1 tablet (25 mg total) by mouth 2 (two)  times daily as needed. 12/20/17   Penumalli, Earlean Polka, MD  traZODone (DESYREL) 50 MG tablet Take 50 mg by mouth at bedtime.  05/03/17   [provider]  verapamil (CALAN-SR) 120 MG CR tablet Take 120 mg by mouth daily.    [provider]  zonisamide (ZONEGRAN) 25 MG capsule Take 100 mg by mouth at bedtime.    [provider]    Family History Family History  Problem Relation Age of Onset  . Cancer Mother   . Stroke Father   . Heart disease Father     Social History Social History   Tobacco Use  . Smoking status: Former Research scientist (life sciences)  . Smokeless tobacco: Former Network engineer Use Topics  . Alcohol use: Not Currently  . Drug use: Never     Allergies   Patient has no known allergies.   Review of Systems Review of Systems  Constitutional: Negative for chills and fever.  Respiratory: Negative for shortness of breath.   Cardiovascular: Negative for chest pain and leg swelling.  Gastrointestinal: Negative for abdominal pain and vomiting.  Musculoskeletal: Positive for arthralgias (R hip). Negative for back pain.  Skin: Negative for color change.  Neurological: Negative for weakness and numbness.  All other systems reviewed and are negative.  Physical Exam Updated Vital Signs BP 131/65 (BP Location: Left Arm)   Pulse 87   Temp 98.1 F (36.7 C) (Oral)   Resp 18   SpO2 97%   Physical Exam Vitals signs and nursing note reviewed.  Constitutional:      General: She is not in acute distress.    Appearance: She is well-developed. She is not toxic-appearing.  HENT:     Head: Normocephalic and atraumatic.  Eyes:     General:        Right eye: No discharge.        Left eye: No discharge.     Conjunctiva/sclera: Conjunctivae normal.  Neck:     Musculoskeletal: Neck supple.  Cardiovascular:     Rate and Rhythm: Normal rate and regular rhythm.     Pulses:          Dorsalis pedis pulses are 2+ on the right side and 2+ on the left side.        Posterior tibial pulses are 2+ on the right side and 2+ on the left side.  Pulmonary:     Effort: Pulmonary effort is normal. No respiratory distress.     Breath sounds: Normal breath sounds. No wheezing, rhonchi or rales.  Abdominal:     General: There is no distension.     Palpations: Abdomen is soft.     Tenderness: There is abdominal tenderness (mild to suprapubic). There is no guarding  or rebound.  Musculoskeletal:     Comments: No obvious deformity, appreciable swelling, erythema, ecchymosis, warmth, open wounds, or rashes. Back: No midline tenderness or palpable step-off Lower extremities: Patient has intact active range of motion throughout the left lower extremity.  She has full intact active range of motion to the right ankle and right knee.  She does have some discomfort in the right hip once the knee gets flexed to 90 degrees.  She able to move the hip in all directions but this is limited secondary to pain.  She is tender over the right ASIS, greater trochanter, and the ischium.  Lower extremities are otherwise nontender.  Skin:    General: Skin is warm and dry.     Capillary Refill: Capillary refill takes less than 2 seconds.     Findings: No rash.  Neurological:     Mental Status: She is alert.     Comments: Clear speech.  Sensation grossly intact bilateral lower extremities.  5 out of 5 strength with plantar and dorsiflexion bilaterally.  Initial ambulation trial deferred.  Psychiatric:        Behavior: Behavior normal.    ED Treatments / Results  Labs (all labs ordered are listed, but only abnormal results are displayed) Labs Reviewed  URINALYSIS, ROUTINE W REFLEX MICROSCOPIC - Abnormal; Notable for the following components:      Result Value   Hgb urine dipstick SMALL (*)    All other components within normal limits  COMPREHENSIVE METABOLIC PANEL - Abnormal; Notable for the following components:   Creatinine, Ser 1.27 (*)    Total Protein 6.4 (*)    AST 13 (*)     GFR calc non Af Amer 42 (*)    GFR calc Af Amer 48 (*)    All other components within normal limits  CBC WITH DIFFERENTIAL/PLATELET - Abnormal; Notable for the following components:   RBC 5.49 (*)    Hemoglobin 15.3 (*)    HCT 50.1 (*)    RDW 16.3 (*)    All other components within normal limits  URINE CULTURE    EKG None  Radiology Ct Hip Right Wo Contrast  Result Date: 03/31/2018 CLINICAL DATA:  Right hip pain for 2 days. EXAM: CT OF THE RIGHT HIP WITHOUT CONTRAST TECHNIQUE: Multidetector CT imaging of the right hip was performed according to the standard protocol. Multiplanar CT image reconstructions were also generated. COMPARISON:  CT scan 02/23/2018 FINDINGS: The right hip is normally located. Moderate degenerative changes appears stable. No acute fracture or evidence of AVN. Chondrocalcinosis is noted at this joint and also at the pubic symphysis. The right hemipelvis is intact. No fractures or bone lesions are identified. No significant intrapelvic abnormalities are identified. No inguinal mass, adenopathy or hernia. The right hip musculature appears grossly normal by CT. No obvious mass or intramuscular hematoma. IMPRESSION: 1. Moderate right hip joint degenerative changes but no acute fracture. 2. Intact right bony pelvis. 3. Chondrocalcinosis. Electronically Signed   By: Marijo Sanes M.D.   On: 03/31/2018 10:19   Dg Hip Unilat With Pelvis 2-3 Views Right  Result Date: 03/31/2018 CLINICAL DATA:  Patient presented to ed with c/o right hip pain for 2 days. No deformity noted. EXAM: DG HIP (WITH OR WITHOUT PELVIS) 2-3V RIGHT COMPARISON:  CT of the abdomen and pelvis on 02/23/2018 FINDINGS: There is no acute fracture or subluxation. No significant degenerative changes of the hip. Scoliosis and osteoarthritis of the LOWER lumbar spine. Surgical clips are noted  in the RIGHT LOWER QUADRANT of the abdomen. Visualized bowel gas pattern is nonobstructed. IMPRESSION: No evidence for acute  abnormality. Electronically Signed   By: Nolon Nations M.D.   On: 03/31/2018 09:30    Procedures Procedures (including critical care time)  Medications Ordered in ED Medications  sodium chloride 0.9 % bolus 500 mL (0 mLs Intravenous Stopped 03/31/18 1214)    Followed by  0.9 %  sodium chloride infusion (0 mLs Intravenous Stopped 03/31/18 1530)  morphine 2 MG/ML injection 2 mg (2 mg Intravenous Given 03/31/18 1100)     Initial Impression / Assessment and Plan / ED Course  I have reviewed the triage vital signs and the nursing notes.  Pertinent labs & imaging results that were available during my care of the patient were reviewed by me and considered in my medical decision making (see chart for details).   Patient presents to the ED with complaints of R hip pain. Nontoxic appearing, no apparent distress, vitals WNL initially. On exam she has no overlying erythema/warmth, afebrile, doubt septic joint/cellulitis. No edema to suggest DVT. She has some limitation in AROM, but is able to move the hip on her own somewhat. NVI distally. Initially evaluated with plain film of the R hip/pelvis- no acute abnormality noted, subsequent CT wo contrast ordered to evaluate for occult fx revealed moderate right hip joint degenerative changes but no acute fracture.Intact right bony pelvis. Chondrocalcinosis. Suspect pain primarily from OA. Patient did have some suprapubic tenderness on exam- discussed with Dr. Johnney Killian who recommends labs & UA which she has ordered.   Work-up reviewed: CBC: No leukocytosis. No anemia CMP: Renal function at baseline. No significant electrolyte disturbance. LFTs without significant abnormality UA: Mild hematuria, no UTI. Culture pending.   Re-assuring work-up overall. Patient has been ambulatory without assistance in the ER. I spoke with her daughter on the phone, [name], who is able to check on the patient in her assisted living facility. At this time feel she is safe for  discharge home with orthopedics/PCP follow up. I discussed results, treatment plan, need for follow-up, and return precautions with the patient as well as her daughter via telephone. Provided opportunity for questions, patient & her daughter confirmed understanding and are in agreement with plan.   Findings and plan of care discussed with supervising physician Dr. Johnney Killian who personally evaluated and examined this patient & provided guidance in evaluation & management- in agreement.   Final Clinical Impressions(s) / ED Diagnoses   Final diagnoses:  Right hip pain    ED Discharge Orders         Ordered    acetaminophen (TYLENOL 8 HOUR) 650 MG CR tablet  Every 8 hours PRN     03/31/18 1456           Jaquavian Firkus, [place] R, PA-C 03/31/18 1545    Charlesetta Shanks, MD 04/01/18 1030

## 2018-03-31 NOTE — Discharge Instructions (Addendum)
Please read and follow all provided instructions.  You have been seen today for right hip pain  Tests performed today include: An x-ray & CT of the affected area - does NOT show any broken bones or dislocations- it does show some arthritis.  Labs: NO significant change from prior, small amount of blood in the urine- primary care recheck within 1-2 weeks.  Vital signs. See below for your results today.   Medications:  Tylenol- take every 6 hours as needed for pain in the hip.   We have prescribed you new medication(s) today. Discuss the medications prescribed today with your pharmacist as they can have adverse effects and interactions with your other medicines including over the counter and prescribed medications. Seek medical evaluation if you start to experience new or abnormal symptoms after taking one of these medicines, seek care immediately if you start to experience difficulty breathing, feeling of your throat closing, facial swelling, or rash as these could be indications of a more serious allergic reaction   Follow-up instructions: Please follow-up with your primary care provider or the provided orthopedic physician (bone specialist) if you continue to have significant pain in 3-5 days. In this case you may have a more severe injury that requires further care.   Return instructions:  Please return if your digits or extremity are numb or tingling, appear gray or blue, or you have severe pain  Please return if you have redness or fevers.  Please return to the Emergency Department if you experience worsening symptoms.  Please return if you have any other emergent concerns. Additional Information:  Your vital signs today were: BP 128/65 (BP Location: Left Arm)    Pulse 64    Temp 98.1 F (36.7 C) (Oral)    Resp 18    SpO2 95%  If your blood pressure (BP) was elevated above 135/85 this visit, please have this repeated by your doctor within one month. ---------------

## 2018-03-31 NOTE — ED Notes (Signed)
Unable to collect UA at this point, pt contaminated sample with TP.

## 2018-04-01 ENCOUNTER — Other Ambulatory Visit: Payer: Self-pay

## 2018-04-01 ENCOUNTER — Emergency Department (HOSPITAL_COMMUNITY)
Admission: EM | Admit: 2018-04-01 | Discharge: 2018-04-01 | Disposition: A | Discharge status: Home / Self Care | Payer: Medicare PPO | Attending: Emergency Medicine | Admitting: Emergency Medicine

## 2018-04-01 ENCOUNTER — Encounter (HOSPITAL_COMMUNITY): Payer: Self-pay

## 2018-04-01 DIAGNOSIS — Z85828 Personal history of other malignant neoplasm of skin: Secondary | ICD-10-CM | POA: Diagnosis not present

## 2018-04-01 DIAGNOSIS — Z79899 Other long term (current) drug therapy: Secondary | ICD-10-CM | POA: Insufficient documentation

## 2018-04-01 DIAGNOSIS — M25552 Pain in left hip: Secondary | ICD-10-CM | POA: Diagnosis not present

## 2018-04-01 DIAGNOSIS — M5430 Sciatica, unspecified side: Secondary | ICD-10-CM | POA: Insufficient documentation

## 2018-04-01 DIAGNOSIS — N189 Chronic kidney disease, unspecified: Secondary | ICD-10-CM | POA: Diagnosis not present

## 2018-04-01 DIAGNOSIS — Z87891 Personal history of nicotine dependence: Secondary | ICD-10-CM | POA: Insufficient documentation

## 2018-04-01 DIAGNOSIS — R5381 Other malaise: Secondary | ICD-10-CM | POA: Diagnosis not present

## 2018-04-01 DIAGNOSIS — M25551 Pain in right hip: Secondary | ICD-10-CM | POA: Diagnosis not present

## 2018-04-01 DIAGNOSIS — I129 Hypertensive chronic kidney disease with stage 1 through stage 4 chronic kidney disease, or unspecified chronic kidney disease: Secondary | ICD-10-CM | POA: Insufficient documentation

## 2018-04-01 DIAGNOSIS — Z7982 Long term (current) use of aspirin: Secondary | ICD-10-CM | POA: Insufficient documentation

## 2018-04-01 DIAGNOSIS — F0390 Unspecified dementia without behavioral disturbance: Secondary | ICD-10-CM | POA: Diagnosis not present

## 2018-04-01 MED ORDER — LIDOCAINE 5 % EX PTCH
1.0000 | MEDICATED_PATCH | CUTANEOUS | Status: DC
  Administered 2018-04-01: 1 via TRANSDERMAL
  Filled 2018-04-01: qty 1

## 2018-04-01 MED ORDER — LIDOCAINE 5 % EX PTCH: 1.0000 | MEDICATED_PATCH | CUTANEOUS | 0 refills | Status: AC

## 2018-04-01 MED ORDER — ACETAMINOPHEN 500 MG PO TABS
1000.0000 mg | ORAL_TABLET | Freq: Once | ORAL | Status: AC
  Administered 2018-04-01: 1000 mg via ORAL
  Filled 2018-04-01: qty 2

## 2018-04-01 NOTE — Progress Notes (Signed)
PT Note  Patient Details Name: Jamie George MRN: [id number] DOB: 04-Mar-1945   Order received on this patient. Noted she has been ambulating in ED while she has been here. Upon entering the room, asked patient how she is feeling and mobilizing , she stated " I can't walk, but I walked to the bathroom after I had to call for help". " Please get me out of here, I have to get out of here and go home". I explained I needed to make sure she was safe with walking. So then she jumped up and walked fast down the hallway, pulling knees up high, skipping" . I asked about pain due to none noted with these movements. She says she has a little pain in her right upper glut area and Right low back area, however it was not preventing any movement at this time. She was independent in the bathroom and walked back to her room.   Screen by PT, no further PT needs at this time.   Thank you  Clide Dales, PT Acute Rehabilitation Services Pager: [phone] Office: [phone] 04/01/2018   Clide Dales 04/01/2018, 10:32 AM

## 2018-04-01 NOTE — Discharge Instructions (Addendum)
Continue taking Tylenol for your symptoms.  You were also given a prescription for Lidoderm patches to help with the pain.  Please follow-up with your regular doctor in regards to your symptoms.    Return to the emergency department immediately if you experience any back pain associated with fevers, loss of control of your bowels/bladder, weakness/numbness to your legs, numbness to your groin area, inability to walk, or inability to urinate.

## 2018-04-01 NOTE — ED Notes (Signed)
Patient states she "needs to get out of here" and "had not eaten in 3 days". Patient was given food reportedly yesterday by a NT who saw her yesterday.  Pt assisted to txt her daughter, and this RN called daughter and left a HIPAA appropriate voicemail.

## 2018-04-01 NOTE — ED Notes (Addendum)
Pt resting comfortably in recliner chair. Blanket given

## 2018-04-01 NOTE — ED Provider Notes (Signed)
Patient care assumed from Leanora Ivanoff, PA-C with plan to f/u on PT/OT assessment.   Physical Exam  BP (!) 163/91 (BP Location: Right Arm)   Pulse 83   Temp 98.2 F (36.8 C) (Oral)   Resp 16   SpO2 94%   Physical Exam Vitals signs and nursing note reviewed.  Constitutional:      General: She is not in acute distress.    Appearance: She is well-developed.  HENT:     Head: Normocephalic and atraumatic.  Eyes:     Conjunctiva/sclera: Conjunctivae normal.  Neck:     Musculoskeletal: Neck supple.  Cardiovascular:     Rate and Rhythm: Normal rate.  Pulmonary:     Effort: Pulmonary effort is normal.  Musculoskeletal:        General: No deformity.  Skin:    General: Skin is dry.  Neurological:     Mental Status: She is alert.  Psychiatric:        Mood and Affect: Mood normal.    ED Course/Procedures     Procedures Results for orders placed or performed during the hospital encounter of 03/31/18  Urinalysis, Routine w reflex microscopic  Result Value Ref Range   Color, Urine YELLOW YELLOW   APPearance CLEAR CLEAR   Specific Gravity, Urine 1.011 1.005 - 1.030   pH 6.0 5.0 - 8.0   Glucose, UA NEGATIVE NEGATIVE mg/dL   Hgb urine dipstick SMALL (A) NEGATIVE   Bilirubin Urine NEGATIVE NEGATIVE   Ketones, ur NEGATIVE NEGATIVE mg/dL   Protein, ur NEGATIVE NEGATIVE mg/dL   Nitrite NEGATIVE NEGATIVE   Leukocytes, UA NEGATIVE NEGATIVE   RBC / HPF 0-5 0 - 5 RBC/hpf   WBC, UA 0-5 0 - 5 WBC/hpf   Bacteria, UA NONE SEEN NONE SEEN   Squamous Epithelial / LPF 0-5 0 - 5  Comprehensive metabolic panel  Result Value Ref Range   Sodium 140 135 - 145 mmol/L   Potassium 3.8 3.5 - 5.1 mmol/L   Chloride 104 98 - 111 mmol/L   CO2 28 22 - 32 mmol/L   Glucose, Bld 92 70 - 99 mg/dL   BUN 14 8 - 23 mg/dL   Creatinine, Ser 1.27 (H) 0.44 - 1.00 mg/dL   Calcium 9.4 8.9 - 10.3 mg/dL   Total Protein 6.4 (L) 6.5 - 8.1 g/dL   Albumin 3.6 3.5 - 5.0 g/dL   AST 13 (L) 15 - 41 U/L   ALT 9 0 -  44 U/L   Alkaline Phosphatase 59 38 - 126 U/L   Total Bilirubin 0.6 0.3 - 1.2 mg/dL   GFR calc non Af Amer 42 (L) >60 mL/min   GFR calc Af Amer 48 (L) >60 mL/min   Anion gap 8 5 - 15  CBC with Differential  Result Value Ref Range   WBC 9.2 4.0 - 10.5 K/uL   RBC 5.49 (H) 3.87 - 5.11 MIL/uL   Hemoglobin 15.3 (H) 12.0 - 15.0 g/dL   HCT 50.1 (H) 36.0 - 46.0 %   MCV 91.3 80.0 - 100.0 fL   MCH 27.9 26.0 - 34.0 pg   MCHC 30.5 30.0 - 36.0 g/dL   RDW 16.3 (H) 11.5 - 15.5 %   Platelets 313 150 - 400 K/uL   nRBC 0.0 0.0 - 0.2 %   Neutrophils Relative % 70 %   Neutro Abs 6.5 1.7 - 7.7 K/uL   Lymphocytes Relative 17 %   Lymphs Abs 1.6 0.7 - 4.0 K/uL  Monocytes Relative 10 %   Monocytes Absolute 0.9 0.1 - 1.0 K/uL   Eosinophils Relative 2 %   Eosinophils Absolute 0.2 0.0 - 0.5 K/uL   Basophils Relative 1 %   Basophils Absolute 0.1 0.0 - 0.1 K/uL   Immature Granulocytes 0 %   Abs Immature Granulocytes 0.03 0.00 - 0.07 K/uL   Ct Hip Right Wo Contrast  Result Date: 03/31/2018 CLINICAL DATA:  Right hip pain for 2 days. EXAM: CT OF THE RIGHT HIP WITHOUT CONTRAST TECHNIQUE: Multidetector CT imaging of the right hip was performed according to the standard protocol. Multiplanar CT image reconstructions were also generated. COMPARISON:  CT scan 02/23/2018 FINDINGS: The right hip is normally located. Moderate degenerative changes appears stable. No acute fracture or evidence of AVN. Chondrocalcinosis is noted at this joint and also at the pubic symphysis. The right hemipelvis is intact. No fractures or bone lesions are identified. No significant intrapelvic abnormalities are identified. No inguinal mass, adenopathy or hernia. The right hip musculature appears grossly normal by CT. No obvious mass or intramuscular hematoma. IMPRESSION: 1. Moderate right hip joint degenerative changes but no acute fracture. 2. Intact right bony pelvis. 3. Chondrocalcinosis. Electronically Signed   By: Marijo Sanes M.D.   On:  03/31/2018 10:19   Dg Hip Unilat With Pelvis 2-3 Views Right  Result Date: 03/31/2018 CLINICAL DATA:  Patient presented to ed with c/o right hip pain for 2 days. No deformity noted. EXAM: DG HIP (WITH OR WITHOUT PELVIS) 2-3V RIGHT COMPARISON:  CT of the abdomen and pelvis on 02/23/2018 FINDINGS: There is no acute fracture or subluxation. No significant degenerative changes of the hip. Scoliosis and osteoarthritis of the LOWER lumbar spine. Surgical clips are noted in the RIGHT LOWER QUADRANT of the abdomen. Visualized bowel gas pattern is nonobstructed. IMPRESSION: No evidence for acute abnormality. Electronically Signed   By: Nolon Nations M.D.   On: 03/31/2018 09:30     MDM   Pt presenting with hip pain. She lives in independent living section of [facility]. She underwent thorough workup yesterday which was reassuring including cbc, cmp at baseline. Ua negative for UTI. Culture has been sent. Xray and CT of the hip were completed to assess for posibility of occul fracture which were negative for this. Patients pain felt to be secondary to OA versus sciatica. She represented today with continued pain. She has been ambulatory. At shift change, pt pending PT/OT consult. If cleared pt safe for d/c home back to her facility.  She has been seen ambulating in the department multiple times throughout her stay.  She was observed skipping and walking quickly while being evaluated by physical therapy.   Patient was evaluated by physical therapy who have cleared the patient to go home to her facility.  Patient has been asking multiple times if she can be discharged.  Feel she is safe for discharge home with continued treatment with Tylenol.  We will also give Rx for Lidoderm patches as they seem to have improved her symptoms today.  Advised her to return the ER for new or worsening symptoms.     Rodney Booze, PA-C 54/27/06 2376    Delora Fuel, MD 28/31/51 2239

## 2018-04-01 NOTE — ED Notes (Signed)
PT at bedside.

## 2018-04-01 NOTE — ED Notes (Signed)
Patient became agitated with nurse stating "Why did you not help me roll over" after she had done so independently without difficulty. Pt requesting food and sandwiches.  RN stated she would bring pt a sandwich.

## 2018-04-01 NOTE — ED Notes (Signed)
Pt ambulated to and from restroom with a steady gait and without assistance.

## 2018-04-01 NOTE — ED Notes (Signed)
Patient wandering in the department with a steady gait and getting agitated. Pt requesting to know when she will be leaving the facility.

## 2018-04-01 NOTE — ED Provider Notes (Signed)
[place] DEPT Provider Note   CSN: [id number] Arrival date & time: 04/01/18  0426     History   Chief Complaint Chief Complaint  Patient presents with  . Hip Pain    R    HPI Jamie George is a 73 y.o. female with a history of dementia, aggression, hypertension, GERD, and skin cancer who presents to the emergency department by EMS from the independent living section of [facility] with right hip pain.  Pain is constant and worse with movement.  She was seen and evaluated in the ER less than 24 hours ago for the same.  She has had no new falls or injuries.  She does not drive and was unable to fill the prescription of Tylenol that she was discharged with.  She states "if you send me home I am just going to lay in my bed all alone because I cannot walk."  Nursing staff reports she ambulated independently to the restroom since arrival in the ER.   She denies numbness, weakness, fever, chills, left lower extremity pain, lower extremity swelling, back pain, or neck pain.  No history of previous back or hip surgeries.  The history is provided by the patient. No language interpreter was used.    Past Medical History:  Diagnosis Date  . Cancer ([place])    skin  . Complication of anesthesia    " difficult to wake me up "  . Depression   . Duodenitis   . GERD (gastroesophageal reflux disease)   . Insomnia   . Migraines   . Mild dementia [facility])     Patient Active Problem List   Diagnosis Date Noted  . Dementia ([place]) 01/02/2018  . Aggression 01/02/2018  . Dissection of abdominal aorta ([place]) 08/13/2017  . Chest pain 08/13/2017  . Hypertension 08/13/2017  . CKD (chronic kidney disease) 08/13/2017  . Anemia 08/13/2017    Past Surgical History:  Procedure Laterality Date  . ABDOMINAL HYSTERECTOMY    . BACK SURGERY    . BIOPSY  03/04/2018   Procedure: BIOPSY;  Surgeon: Otis Brace, MD;  [facility];  Service:  Gastroenterology;;  . BRAIN SURGERY  2013   meningioma  . ENTEROSCOPY N/A 03/04/2018   Procedure: PUSH ENTEROSCOPY;  Surgeon: Otis Brace, MD;  [facility];  Service: Gastroenterology;  Laterality: N/A;  . SHOULDER SURGERY       OB History   No obstetric history on file.      Home Medications    Prior to Admission medications   Medication Sig Start Date End Date Taking? Authorizing Provider  acetaminophen (TYLENOL 8 HOUR) 650 MG CR tablet Take 1 tablet (650 mg total) by mouth every 8 (eight) hours as needed for pain. 03/31/18   Petrucelli, Glynda Jaeger, PA-C  aspirin EC 81 MG EC tablet Take 1 tablet (81 mg total) by mouth daily. With food 08/15/17   Roxan Hockey, MD  atorvastatin (LIPITOR) 40 MG tablet Take 40 mg by mouth 2 (two) times a week. Sundays and Thursdays    [provider]  BESIVANCE 0.6 % SUSP Place 1 drop into the right eye 3 (three) times daily.  12/14/17   [provider]  donepezil (ARICEPT) 10 MG tablet Take 10 mg by mouth at bedtime.    [provider]  escitalopram (LEXAPRO) 20 MG tablet Take 20 mg by mouth at bedtime.  05/03/17   [provider]  ferrous sulfate 325 (65 FE) MG tablet  Take 325 mg by mouth 2 (two) times daily with a meal.    [provider]  fluticasone (FLONASE) 50 MCG/ACT nasal spray Place 1 spray into both nostrils daily.    [provider]  HYDROcodone-acetaminophen (NORCO/VICODIN) 5-325 MG tablet Take 1 tablet by mouth every 6 (six) hours as needed for severe pain. 02/23/18   Blanchie Dessert, MD  Melatonin 5 MG TABS Take 5 mg by mouth at bedtime.    [provider]  memantine (NAMENDA) 10 MG tablet Take 1 tablet (10 mg total) by mouth 2 (two) times daily. 12/17/17   Penumalli, Earlean Polka, MD  ondansetron (ZOFRAN) 4 MG tablet Take 1 tablet (4 mg total) by mouth every 6 (six) hours as needed for nausea. 08/14/17   Roxan Hockey, MD  pantoprazole (PROTONIX) 40 MG tablet Take  1 tablet (40 mg total) by mouth 2 (two) times daily before a meal. 08/14/17 08/14/18  Emokpae, Courage, MD  polyethylene glycol (MIRALAX / GLYCOLAX) packet Take 17 g by mouth daily.    [provider]  QUEtiapine (SEROQUEL) 25 MG tablet Take 1 tablet (25 mg total) by mouth 2 (two) times daily as needed. Patient not taking: Reported on 03/31/2018 12/20/17   Penumalli, Earlean Polka, MD  risperiDONE (RISPERDAL) 0.5 MG tablet Take 0.5 mg by mouth at bedtime.    [provider]  traZODone (DESYREL) 50 MG tablet Take 50 mg by mouth at bedtime.  05/03/17   [provider]  verapamil (CALAN-SR) 120 MG CR tablet Take 120 mg by mouth daily.    [provider]  zonisamide (ZONEGRAN) 100 MG capsule Take 100 mg by mouth at bedtime.     [provider]    Family History Family History  Problem Relation Age of Onset  . Cancer Mother   . Stroke Father   . Heart disease Father     Social History Social History   Tobacco Use  . Smoking status: Former Research scientist (life sciences)  . Smokeless tobacco: Former Network engineer Use Topics  . Alcohol use: Not Currently  . Drug use: Never     Allergies   Patient has no known allergies.   Review of Systems Review of Systems  Constitutional: Negative for activity change, chills and fever.  Respiratory: Negative for shortness of breath.   Cardiovascular: Negative for chest pain.  Gastrointestinal: Negative for abdominal pain, diarrhea, nausea and vomiting.  Genitourinary: Negative for dysuria.  Musculoskeletal: Positive for arthralgias, gait problem and myalgias. Negative for back pain, neck pain and neck stiffness.  Skin: Negative for rash.  Allergic/Immunologic: Negative for immunocompromised state.  Neurological: Negative for dizziness, numbness and headaches.  Psychiatric/Behavioral: Negative for confusion.   Physical Exam Updated Vital Signs BP (!) 145/88   Pulse 77   Temp 98.2 F (36.8 C) (Oral)   Resp 16   SpO2 93%    Physical Exam Vitals signs and nursing note reviewed.  Constitutional:      General: She is not in acute distress. HENT:     Head: Normocephalic.  Eyes:     Conjunctiva/sclera: Conjunctivae normal.  Neck:     Musculoskeletal: Neck supple.  Cardiovascular:     Rate and Rhythm: Normal rate and regular rhythm.     Heart sounds: No murmur. No friction rub. No gallop.   Pulmonary:     Effort: Pulmonary effort is normal. No respiratory distress.     Breath sounds: No stridor. No wheezing, rhonchi or rales.  Chest:  Chest wall: No tenderness.  Abdominal:     General: There is no distension.     Palpations: Abdomen is soft.  Musculoskeletal:     Comments: Tender to palpation over the right SI joint.  She also has a positive straight leg raise.  Gait is difficult to assess as nursing staff reports that she was able to lift her right leg.  On my assessment, she is dragging her right leg because she states she is unable to lift it.  Moves all 4 extremities independently.  Sensation is intact and equal throughout the bilateral lower extremities.  5 out of 5 strength against resistance with dorsiflexion plantarflexion.  DP PT pulses are 2+ and symmetric.  No tenderness over the left SI joint.  No focal tenderness over the cervical, thoracic, lumbar spinous processes or bilateral paraspinal muscles.  No tenderness to the right anterolateral hip.  Full active and passive range of motion of the right hip, knee, and ankle.  Skin:    General: Skin is warm.     Findings: No rash.  Neurological:     Mental Status: She is alert.  Psychiatric:        Behavior: Behavior normal.      ED Treatments / Results  Labs (all labs ordered are listed, but only abnormal results are displayed) Labs Reviewed - No data to display  EKG None  Radiology Ct Hip Right Wo Contrast  Result Date: 03/31/2018 CLINICAL DATA:  Right hip pain for 2 days. EXAM: CT OF THE RIGHT HIP WITHOUT CONTRAST TECHNIQUE:  Multidetector CT imaging of the right hip was performed according to the standard protocol. Multiplanar CT image reconstructions were also generated. COMPARISON:  CT scan 02/23/2018 FINDINGS: The right hip is normally located. Moderate degenerative changes appears stable. No acute fracture or evidence of AVN. Chondrocalcinosis is noted at this joint and also at the pubic symphysis. The right hemipelvis is intact. No fractures or bone lesions are identified. No significant intrapelvic abnormalities are identified. No inguinal mass, adenopathy or hernia. The right hip musculature appears grossly normal by CT. No obvious mass or intramuscular hematoma. IMPRESSION: 1. Moderate right hip joint degenerative changes but no acute fracture. 2. Intact right bony pelvis. 3. Chondrocalcinosis. Electronically Signed   By: Marijo Sanes M.D.   On: 03/31/2018 10:19   Dg Hip Unilat With Pelvis 2-3 Views Right  Result Date: 03/31/2018 CLINICAL DATA:  Patient presented to ed with c/o right hip pain for 2 days. No deformity noted. EXAM: DG HIP (WITH OR WITHOUT PELVIS) 2-3V RIGHT COMPARISON:  CT of the abdomen and pelvis on 02/23/2018 FINDINGS: There is no acute fracture or subluxation. No significant degenerative changes of the hip. Scoliosis and osteoarthritis of the LOWER lumbar spine. Surgical clips are noted in the RIGHT LOWER QUADRANT of the abdomen. Visualized bowel gas pattern is nonobstructed. IMPRESSION: No evidence for acute abnormality. Electronically Signed   By: Nolon Nations M.D.   On: 03/31/2018 09:30    Procedures Procedures (including critical care time)  Medications Ordered in ED Medications  acetaminophen (TYLENOL) tablet 1,000 mg (has no administration in time range)  lidocaine (LIDODERM) 5 % 1 patch (has no administration in time range)     Initial Impression / Assessment and Plan / ED Course  I have reviewed the triage vital signs and the nursing notes.  Pertinent labs & imaging results that  were available during my care of the patient were reviewed by me and considered in my medical decision  making (see chart for details).     73 year old female with a history of dementia, aggression, hypertension, GERD, and skin cancer presenting for her second evaluation of right hip pain in the last 24 hours.  She received a very thorough work-up of her symptoms yesterday, but she is having continued difficulty with ambulating.  The patient was seen and evaluated along with Dr. Roxanne Mins, attending physician.  Right hip CT with chondrocalcinosis and moderate right hip joint degenerative changes.   We will give Tylenol and lidocaine patch in the ER.  Given her living situation, will consult PT/OT since the patient lives independently.  Patient care transferred to Dr. Tivis Ringer at the end of my shift. Patient presentation, ED course, and plan of care discussed with review of all pertinent labs and imaging. Please see his/her note for further details regarding further ED course and disposition.   Final Clinical Impressions(s) / ED Diagnoses   Final diagnoses:  None    ED Discharge Orders    None       Joanne Gavel, PA-C 93/26/71 [id number]    Delora Fuel, MD 09/98/33 [phone]

## 2018-04-01 NOTE — ED Triage Notes (Signed)
Pt reports continued R hip pain. Discharged for same from this ED at 258p. No deformity. She is ambulatory. A&Ox4.

## 2018-04-01 NOTE — ED Notes (Signed)
Bed: [facility]PT2 Expected date:  Expected time:  Means of arrival:  Comments: 

## 2018-04-01 NOTE — ED Notes (Signed)
Bed: WA02 Expected date:  Expected time:  Means of arrival:  Comments: 

## 2018-04-01 NOTE — ED Notes (Signed)
RN made sure PT was aware of patient need for evaluation.

## 2018-04-02 DIAGNOSIS — R2681 Unsteadiness on feet: Secondary | ICD-10-CM | POA: Diagnosis not present

## 2018-04-02 DIAGNOSIS — R41841 Cognitive communication deficit: Secondary | ICD-10-CM | POA: Diagnosis not present

## 2018-04-02 DIAGNOSIS — M6281 Muscle weakness (generalized): Secondary | ICD-10-CM | POA: Diagnosis not present

## 2018-04-02 DIAGNOSIS — R488 Other symbolic dysfunctions: Secondary | ICD-10-CM | POA: Diagnosis not present

## 2018-04-02 LAB — URINE CULTURE: Culture: <10000 — AB

## 2018-04-09 ENCOUNTER — Ambulatory Visit: Payer: Medicare PPO | Admitting: Diagnostic Neuroimaging

## 2018-04-09 ENCOUNTER — Encounter: Payer: Self-pay | Admitting: Diagnostic Neuroimaging

## 2018-04-09 ENCOUNTER — Encounter

## 2018-04-09 VITALS — BP 146/78 | HR 72 | Ht 66.0 in | Wt 128.0 lb

## 2018-04-09 DIAGNOSIS — F03A0 Unspecified dementia, mild, without behavioral disturbance, psychotic disturbance, mood disturbance, and anxiety: Secondary | ICD-10-CM

## 2018-04-09 DIAGNOSIS — F0390 Unspecified dementia without behavioral disturbance: Secondary | ICD-10-CM

## 2018-04-09 NOTE — Progress Notes (Signed)
[facility]  PATIENT: Jamie George DOB: [birth year]  REFERRING CLINICIAN: Wilhemena Durie, PA HISTORY FROM: patient and daughter  REASON FOR VISIT: follow up   HISTORICAL  CHIEF COMPLAINT:  Chief Complaint  Patient presents with  . Dementia    rm 7, dgtrLarene Beach, MMSE 19 "headahces-keep her from sleeping, says she has them every day; seeing psychiatry, numerous ER visits-unfounded need)  . Follow-up    6 month    HISTORY OF PRESENT ILLNESS:   UPDATE (04/09/18, VRP): Since last visit, now living at [facility]. Has been to ER several times for hip pain. Has been to [facility]. Now on risperdal and mirtazipine. Pt c/o HA and insomnia.   UPDATE (12/17/17, VRP): Since last visit, doing poorly. Symptoms are progressive. Now with rage outbursts, throwing things, mood swings subjective shortness of breath. Now cannot handle her own meds. Agitated.   PRIOR HPI (07/06/17, VRP): 73 year old female here for evaluation of memory loss.  I asked patient why she was referred here today, and she told me "my daughter asked me to come here".  Patient realizes that daughter is concerned about mild memory loss.  Patient does not feel like she has any significant memory problems.  According to daughter patient was living in [place] with husband until 04/02/15 when he passed away.  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  Patient developed some depression following this.  Patient was having more difficulty living at home, operating appliances such as TV, blender, stove, coffee maker.  Patient's daughter was visiting her from [place] on a weekly basis.  Patient had thrown away several appliances because she told her daughter they "did not work".  Patient was trying to miss appointments.  She was having increasing questions and repetitive  conversations with daughter.  She was losing weight, eating less, and having other problems.  Therefore patient's daughter arranged for patient to move to [facility] in February 2019.  Patient continues to have some early morning confusion.  This prompted family to arrange 3 hours of home health aide care.  Patient is able to perform most of her activities of daily living such as dressing, toileting, eating and transferring.  She needs some help washing her hair but otherwise is able to bathe herself.  [place] ADL score 5 out of 6.  Regarding instrumental activities of daily living --> [place] instrumental ADL score 4 out of 8. She still able to use a telephone, do light housework, do simple financial transactions.  However she needs help with shopping, laundry, transportation, medications, food preparation.     REVIEW OF SYSTEMS: Full 14 system review of systems performed and negative with exception of: memory loss headache insomnia joint pain.    ALLERGIES: No Known Allergies  HOME MEDICATIONS: Outpatient Medications Prior to Visit  Medication Sig Dispense Refill  . acetaminophen (TYLENOL 8 HOUR) 650 MG CR tablet Take 1 tablet (650 mg total) by mouth every 8 (eight) hours as needed for pain. 15 tablet 0  . aspirin EC 81 MG EC tablet Take 1 tablet (81 mg total) by mouth daily. With food 120 tablet 2  . atorvastatin (LIPITOR) 40 MG tablet Take 40 mg by mouth 2 (two) times a week. Sundays and Thursdays    . baclofen (LIORESAL) 10 MG tablet 10 mg as needed.    . donepezil (ARICEPT) 10 MG tablet  Take 10 mg by mouth at bedtime.    Marland Kitchen escitalopram (LEXAPRO) 20 MG tablet Take 20 mg by mouth at bedtime.   0  . ferrous sulfate 325 (65 FE) MG tablet Take 325 mg by mouth 2 (two) times daily with a meal.    . fluticasone (FLONASE) 50 MCG/ACT nasal spray Place 1 spray into both nostrils daily.    Marland Kitchen HYDROcodone-acetaminophen (NORCO/VICODIN) 5-325 MG tablet Take 1 tablet by mouth every 6 (six) hours as needed  for severe pain. 10 tablet 0  . Melatonin 5 MG TABS Take 5 mg by mouth at bedtime.    . memantine (NAMENDA) 10 MG tablet Take 1 tablet (10 mg total) by mouth 2 (two) times daily. 60 tablet 12  . mirtazapine (REMERON) 7.5 MG tablet Take 7.5 mg by mouth at bedtime.    . ondansetron (ZOFRAN) 4 MG tablet Take 1 tablet (4 mg total) by mouth every 6 (six) hours as needed for nausea. 20 tablet 0  . pantoprazole (PROTONIX) 40 MG tablet Take 1 tablet (40 mg total) by mouth 2 (two) times daily before a meal. 60 tablet 2  . polyethylene glycol (MIRALAX / GLYCOLAX) packet Take 17 g by mouth daily.    . risperiDONE (RISPERDAL) 0.5 MG tablet Take 0.5 mg by mouth at bedtime.    . traZODone (DESYREL) 50 MG tablet Take 50 mg by mouth at bedtime.   0  . verapamil (CALAN-SR) 120 MG CR tablet Take 120 mg by mouth daily.    Marland Kitchen zonisamide (ZONEGRAN) 100 MG capsule Take 100 mg by mouth at bedtime.     Marland Kitchen QUEtiapine (SEROQUEL) 25 MG tablet Take 1 tablet (25 mg total) by mouth 2 (two) times daily as needed. (Patient not taking: Reported on 03/31/2018) 30 tablet 1  . BESIVANCE 0.6 % SUSP Place 1 drop into the right eye 3 (three) times daily.   1   No facility-administered medications prior to visit.     PAST MEDICAL HISTORY: Past Medical History:  Diagnosis Date  . Cancer ([place])    skin  . Complication of anesthesia    " difficult to wake me up "  . Depression   . Duodenitis   . GERD (gastroesophageal reflux disease)   . Insomnia   . Migraines   . Mild dementia ([place])     PAST SURGICAL HISTORY: Past Surgical History:  Procedure Laterality Date  . ABDOMINAL HYSTERECTOMY    . BACK SURGERY    . BIOPSY  03/04/2018   Procedure: BIOPSY;  Surgeon: Otis Brace, MD;  [facility];  Service: Gastroenterology;;  . BRAIN SURGERY  2013   meningioma  . ENTEROSCOPY N/A 03/04/2018   Procedure: PUSH ENTEROSCOPY;  Surgeon: Otis Brace, MD;  [facility];  Service: Gastroenterology;  Laterality:  N/A;  . SHOULDER SURGERY      FAMILY HISTORY: Family History  Problem Relation Age of Onset  . Cancer Mother   . Stroke Father   . Heart disease Father     SOCIAL HISTORY:  Social History   Socioeconomic History  . Marital status: Widowed    Spouse name: Not on file  . Number of children: 2  . Years of education: 62  . Highest education level: Not on file  Occupational History    Comment: retired  Scientific laboratory technician  . Financial resource strain: Not on file  . Food insecurity:    Worry: Not on file    Inability: Not on file  .  Transportation needs:    Medical: Not on file    Non-medical: Not on file  Tobacco Use  . Smoking status: Former Research scientist (life sciences)  . Smokeless tobacco: Former Network engineer and Sexual Activity  . Alcohol use: Not Currently  . Drug use: Never  . Sexual activity: Not Currently  Lifestyle  . Physical activity:    Days per week: Not on file    Minutes per session: Not on file  . Stress: Not on file  Relationships  . Social connections:    Talks on phone: Not on file    Gets together: Not on file    Attends religious service: Not on file    Active member of club or organization: Not on file    Attends meetings of clubs or organizations: Not on file    Relationship status: Not on file  . Intimate partner violence:    Fear of current or ex partner: Not on file    Emotionally abused: Not on file    Physically abused: Not on file    Forced sexual activity: Not on file  Other Topics Concern  . Not on file  Social History Narrative   12/17/17 Lives alone in home.  Retired.  Widowed.  Children 2 (daughter, [name]).     Caffeine some.      PHYSICAL EXAM  GENERAL EXAM/CONSTITUTIONAL: Vitals:  Vitals:   04/09/18 1128  BP: (!) 146/78  Pulse: 72  Weight: 128 lb (58.1 kg)  Height: 5\' 6"  (1.676 m)   Body mass index is 20.66 kg/m. No exam data present  Patient is in no distress; well developed, nourished and groomed; neck is supple  LEFT UPPER  LIP STERISTRIP / SWELLING (RECENT MOHS SURGERY)  CARDIOVASCULAR:  Examination of carotid arteries is normal; no carotid bruits  Regular rate and rhythm, no murmurs  Examination of peripheral vascular system by observation and palpation is normal  EYES:  Ophthalmoscopic exam of optic discs and posterior segments is normal; no papilledema or hemorrhages  MUSCULOSKELETAL:  Gait, strength, tone, movements noted in Neurologic exam below  NEUROLOGIC: MENTAL STATUS:  MMSE - [place] Exam 04/09/2018 12/17/2017 07/06/2017  Orientation to time 1 3 4   Orientation to Place 3 4 3   Registration 3 3 3   Attention/ Calculation 2 4 4   Recall 3 2 2   Language- name 2 objects 2 2 2   Language- repeat 1 1 1   Language- follow 3 step command 1 3 2   Language- read & follow direction 1 1 1   Write a sentence 1 1 1   Copy design 1 1 1   Total score 19 25 24     CALM oriented to person, place and time  DECR MEMORY   DECR attention and concentration  language fluent, comprehension intact, naming intact,   fund of knowledge appropriate  DECR INSIGHT   CRANIAL NERVE:   2nd - no papilledema on fundoscopic exam  2nd, 3rd, 4th, 6th - pupils equal and reactive to light, visual fields full to confrontation, extraocular muscles intact, no nystagmus  5th - facial sensation symmetric  7th - facial strength symmetric  8th - hearing intact  9th - palate elevates symmetrically, uvula midline  11th - shoulder shrug symmetric  12th - tongue protrusion midline  MOTOR:   normal bulk and tone, full strength in the BUE, BLE  SENSORY:   normal and symmetric to light touch  COORDINATION:   finger-nose-finger, fine finger movements normal  REFLEXES:   deep tendon reflexes present  and symmetric  GAIT/STATION:   narrow based gait    DIAGNOSTIC DATA (LABS, IMAGING, TESTING) - I reviewed patient records, labs, notes, testing and imaging myself where available.  Lab Results    Component Value Date   WBC 9.2 03/31/2018   HGB 15.3 (H) 03/31/2018   HCT 50.1 (H) 03/31/2018   MCV 91.3 03/31/2018   PLT 313 03/31/2018      Component Value Date/Time   NA 140 03/31/2018 1103   K 3.8 03/31/2018 1103   CL 104 03/31/2018 1103   CO2 28 03/31/2018 1103   GLUCOSE 92 03/31/2018 1103   BUN 14 03/31/2018 1103   CREATININE 1.27 (H) 03/31/2018 1103   CALCIUM 9.4 03/31/2018 1103   PROT 6.4 (L) 03/31/2018 1103   ALBUMIN 3.6 03/31/2018 1103   AST 13 (L) 03/31/2018 1103   ALT 9 03/31/2018 1103   ALKPHOS 59 03/31/2018 1103   BILITOT 0.6 03/31/2018 1103   GFRNONAA 42 (L) 03/31/2018 1103   GFRAA 48 (L) 03/31/2018 1103   Lab Results  Component Value Date   CHOL 216 (H) 08/13/2017   HDL 59 08/13/2017   LDLCALC 137 (H) 08/13/2017   TRIG 102 08/13/2017   CHOLHDL 3.7 08/13/2017   No results found for: HGBA1C Lab Results  Component Value Date   VITAMINB12 519 08/13/2017   Lab Results  Component Value Date   TSH 1.627 08/13/2017    B12 503  TSH 0.88  11/17/17 CT HEAD: 1. No acute intracranial process. 2. Status post LEFT frontal craniotomy for meningioma resection with similar LEFT frontal encephalomalacia. 3. 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 Mild chronic small vessel ischemic changes.  11/17/17 CT ORBITS: 1. Normal contrast enhanced CT orbits.     ASSESSMENT AND PLAN  73 y.o. year old female here with gradual onset progressive short-term memory loss, confusion, decline in activities of daily living, decreased insight, depression, most consistent with neurodegenerative dementia. Now with significant mood changes and behavior changes   Dx: mil-moderate dementia with behavior disturbance (AD vs FTD)  1. Mild dementia ([place])      PLAN:  DEMENTIA WITH BEHAVIORAL DISTURBANCE (stable) - continue donepezil and memantine 10mg  twice a day  - continue lexapro, risperdal, mirtazipine per PCP - caregiver resources reviewed  ANXIETY /  INSOMNIA - per psychiatry for mood stabilization  Return for pending if symptoms worsen or fail to improve.    Penni Bombard, MD 9/73/5329, 92:42 AM Certified in Neurology, Neurophysiology and Neuroimaging  [facility] [address] [place], [place] [address] [phone]

## 2018-04-15 DIAGNOSIS — R488 Other symbolic dysfunctions: Secondary | ICD-10-CM | POA: Diagnosis not present

## 2018-04-15 DIAGNOSIS — R2681 Unsteadiness on feet: Secondary | ICD-10-CM | POA: Diagnosis not present

## 2018-04-15 DIAGNOSIS — R41841 Cognitive communication deficit: Secondary | ICD-10-CM | POA: Diagnosis not present

## 2018-04-15 DIAGNOSIS — M6281 Muscle weakness (generalized): Secondary | ICD-10-CM | POA: Diagnosis not present

## 2018-04-17 DIAGNOSIS — R41841 Cognitive communication deficit: Secondary | ICD-10-CM | POA: Diagnosis not present

## 2018-04-17 DIAGNOSIS — R2681 Unsteadiness on feet: Secondary | ICD-10-CM | POA: Diagnosis not present

## 2018-04-17 DIAGNOSIS — M6281 Muscle weakness (generalized): Secondary | ICD-10-CM | POA: Diagnosis not present

## 2018-04-17 DIAGNOSIS — R488 Other symbolic dysfunctions: Secondary | ICD-10-CM | POA: Diagnosis not present

## 2018-04-18 DIAGNOSIS — M6281 Muscle weakness (generalized): Secondary | ICD-10-CM | POA: Diagnosis not present

## 2018-04-18 DIAGNOSIS — R488 Other symbolic dysfunctions: Secondary | ICD-10-CM | POA: Diagnosis not present

## 2018-04-18 DIAGNOSIS — R41841 Cognitive communication deficit: Secondary | ICD-10-CM | POA: Diagnosis not present

## 2018-04-18 DIAGNOSIS — R2681 Unsteadiness on feet: Secondary | ICD-10-CM | POA: Diagnosis not present

## 2018-04-22 DIAGNOSIS — R488 Other symbolic dysfunctions: Secondary | ICD-10-CM | POA: Diagnosis not present

## 2018-04-22 DIAGNOSIS — R41841 Cognitive communication deficit: Secondary | ICD-10-CM | POA: Diagnosis not present

## 2018-04-24 DIAGNOSIS — R488 Other symbolic dysfunctions: Secondary | ICD-10-CM | POA: Diagnosis not present

## 2018-04-24 DIAGNOSIS — R41841 Cognitive communication deficit: Secondary | ICD-10-CM | POA: Diagnosis not present

## 2018-04-25 DIAGNOSIS — R488 Other symbolic dysfunctions: Secondary | ICD-10-CM | POA: Diagnosis not present

## 2018-04-25 DIAGNOSIS — R41841 Cognitive communication deficit: Secondary | ICD-10-CM | POA: Diagnosis not present

## 2018-04-30 DIAGNOSIS — R41841 Cognitive communication deficit: Secondary | ICD-10-CM | POA: Diagnosis not present

## 2018-04-30 DIAGNOSIS — R488 Other symbolic dysfunctions: Secondary | ICD-10-CM | POA: Diagnosis not present

## 2018-05-22 ENCOUNTER — Telehealth: Payer: Self-pay | Admitting: *Deleted

## 2018-05-22 NOTE — Telephone Encounter (Signed)
Received fax from [facility], re: need hard copy Rx for aricept sent to [place] and to [facility]. Called and spoke with [facility], advised her Dr Leta Baptist doesn't prescribe Aricept. She acknowledged, stated the Rx has been gotten form PCP.

## 2018-06-09 DIAGNOSIS — M79642 Pain in left hand: Secondary | ICD-10-CM | POA: Diagnosis not present

## 2018-06-14 DIAGNOSIS — R21 Rash and other nonspecific skin eruption: Secondary | ICD-10-CM | POA: Diagnosis not present

## 2018-07-01 DIAGNOSIS — M25571 Pain in right ankle and joints of right foot: Secondary | ICD-10-CM | POA: Diagnosis not present

## 2018-07-11 DIAGNOSIS — Z20828 Contact with and (suspected) exposure to other viral communicable diseases: Secondary | ICD-10-CM | POA: Diagnosis not present

## 2018-07-22 ENCOUNTER — Encounter: Payer: Self-pay | Admitting: *Deleted

## 2018-07-22 NOTE — Telephone Encounter (Signed)
Letter for lawyer on Dr [employer] desk for review, signature. Daughter, [name] is aware that Dr Stark Klein will be in the office tomorrow to sign.

## 2018-08-16 DIAGNOSIS — K279 Peptic ulcer, site unspecified, unspecified as acute or chronic, without hemorrhage or perforation: Secondary | ICD-10-CM | POA: Diagnosis not present

## 2018-08-16 DIAGNOSIS — F329 Major depressive disorder, single episode, unspecified: Secondary | ICD-10-CM | POA: Diagnosis not present

## 2018-08-16 DIAGNOSIS — D509 Iron deficiency anemia, unspecified: Secondary | ICD-10-CM | POA: Diagnosis not present

## 2018-08-16 DIAGNOSIS — E785 Hyperlipidemia, unspecified: Secondary | ICD-10-CM | POA: Diagnosis not present

## 2018-08-16 DIAGNOSIS — I10 Essential (primary) hypertension: Secondary | ICD-10-CM | POA: Diagnosis not present

## 2018-08-16 DIAGNOSIS — R21 Rash and other nonspecific skin eruption: Secondary | ICD-10-CM | POA: Diagnosis not present

## 2018-09-25 DIAGNOSIS — M6281 Muscle weakness (generalized): Secondary | ICD-10-CM | POA: Diagnosis not present

## 2018-09-25 DIAGNOSIS — R488 Other symbolic dysfunctions: Secondary | ICD-10-CM | POA: Diagnosis not present

## 2018-09-27 DIAGNOSIS — M6281 Muscle weakness (generalized): Secondary | ICD-10-CM | POA: Diagnosis not present

## 2018-09-27 DIAGNOSIS — R488 Other symbolic dysfunctions: Secondary | ICD-10-CM | POA: Diagnosis not present

## 2018-09-30 DIAGNOSIS — R488 Other symbolic dysfunctions: Secondary | ICD-10-CM | POA: Diagnosis not present

## 2018-09-30 DIAGNOSIS — M6281 Muscle weakness (generalized): Secondary | ICD-10-CM | POA: Diagnosis not present

## 2018-09-30 DIAGNOSIS — M25651 Stiffness of right hip, not elsewhere classified: Secondary | ICD-10-CM | POA: Diagnosis not present

## 2018-09-30 DIAGNOSIS — M25652 Stiffness of left hip, not elsewhere classified: Secondary | ICD-10-CM | POA: Diagnosis not present

## 2018-09-30 DIAGNOSIS — R2681 Unsteadiness on feet: Secondary | ICD-10-CM | POA: Diagnosis not present

## 2018-09-30 DIAGNOSIS — R41841 Cognitive communication deficit: Secondary | ICD-10-CM | POA: Diagnosis not present

## 2018-09-30 DIAGNOSIS — R2689 Other abnormalities of gait and mobility: Secondary | ICD-10-CM | POA: Diagnosis not present

## 2018-10-01 DIAGNOSIS — R41841 Cognitive communication deficit: Secondary | ICD-10-CM | POA: Diagnosis not present

## 2018-10-01 DIAGNOSIS — R2681 Unsteadiness on feet: Secondary | ICD-10-CM | POA: Diagnosis not present

## 2018-10-01 DIAGNOSIS — R2689 Other abnormalities of gait and mobility: Secondary | ICD-10-CM | POA: Diagnosis not present

## 2018-10-01 DIAGNOSIS — R488 Other symbolic dysfunctions: Secondary | ICD-10-CM | POA: Diagnosis not present

## 2018-10-01 DIAGNOSIS — M25652 Stiffness of left hip, not elsewhere classified: Secondary | ICD-10-CM | POA: Diagnosis not present

## 2018-10-01 DIAGNOSIS — M25651 Stiffness of right hip, not elsewhere classified: Secondary | ICD-10-CM | POA: Diagnosis not present

## 2018-10-01 DIAGNOSIS — M6281 Muscle weakness (generalized): Secondary | ICD-10-CM | POA: Diagnosis not present

## 2018-10-02 DIAGNOSIS — R41841 Cognitive communication deficit: Secondary | ICD-10-CM | POA: Diagnosis not present

## 2018-10-02 DIAGNOSIS — M6281 Muscle weakness (generalized): Secondary | ICD-10-CM | POA: Diagnosis not present

## 2018-10-02 DIAGNOSIS — M25651 Stiffness of right hip, not elsewhere classified: Secondary | ICD-10-CM | POA: Diagnosis not present

## 2018-10-02 DIAGNOSIS — R488 Other symbolic dysfunctions: Secondary | ICD-10-CM | POA: Diagnosis not present

## 2018-10-02 DIAGNOSIS — M25652 Stiffness of left hip, not elsewhere classified: Secondary | ICD-10-CM | POA: Diagnosis not present

## 2018-10-02 DIAGNOSIS — R2681 Unsteadiness on feet: Secondary | ICD-10-CM | POA: Diagnosis not present

## 2018-10-02 DIAGNOSIS — R2689 Other abnormalities of gait and mobility: Secondary | ICD-10-CM | POA: Diagnosis not present

## 2018-10-03 DIAGNOSIS — M6281 Muscle weakness (generalized): Secondary | ICD-10-CM | POA: Diagnosis not present

## 2018-10-03 DIAGNOSIS — R2689 Other abnormalities of gait and mobility: Secondary | ICD-10-CM | POA: Diagnosis not present

## 2018-10-03 DIAGNOSIS — M25652 Stiffness of left hip, not elsewhere classified: Secondary | ICD-10-CM | POA: Diagnosis not present

## 2018-10-03 DIAGNOSIS — R2681 Unsteadiness on feet: Secondary | ICD-10-CM | POA: Diagnosis not present

## 2018-10-03 DIAGNOSIS — M25651 Stiffness of right hip, not elsewhere classified: Secondary | ICD-10-CM | POA: Diagnosis not present

## 2018-10-03 DIAGNOSIS — R488 Other symbolic dysfunctions: Secondary | ICD-10-CM | POA: Diagnosis not present

## 2018-10-03 DIAGNOSIS — R41841 Cognitive communication deficit: Secondary | ICD-10-CM | POA: Diagnosis not present

## 2018-10-04 DIAGNOSIS — R2689 Other abnormalities of gait and mobility: Secondary | ICD-10-CM | POA: Diagnosis not present

## 2018-10-04 DIAGNOSIS — R2681 Unsteadiness on feet: Secondary | ICD-10-CM | POA: Diagnosis not present

## 2018-10-04 DIAGNOSIS — R488 Other symbolic dysfunctions: Secondary | ICD-10-CM | POA: Diagnosis not present

## 2018-10-04 DIAGNOSIS — M6281 Muscle weakness (generalized): Secondary | ICD-10-CM | POA: Diagnosis not present

## 2018-10-04 DIAGNOSIS — M25652 Stiffness of left hip, not elsewhere classified: Secondary | ICD-10-CM | POA: Diagnosis not present

## 2018-10-04 DIAGNOSIS — M25651 Stiffness of right hip, not elsewhere classified: Secondary | ICD-10-CM | POA: Diagnosis not present

## 2018-10-04 DIAGNOSIS — R41841 Cognitive communication deficit: Secondary | ICD-10-CM | POA: Diagnosis not present

## 2018-10-07 DIAGNOSIS — R2689 Other abnormalities of gait and mobility: Secondary | ICD-10-CM | POA: Diagnosis not present

## 2018-10-07 DIAGNOSIS — R2681 Unsteadiness on feet: Secondary | ICD-10-CM | POA: Diagnosis not present

## 2018-10-07 DIAGNOSIS — M25651 Stiffness of right hip, not elsewhere classified: Secondary | ICD-10-CM | POA: Diagnosis not present

## 2018-10-07 DIAGNOSIS — M6281 Muscle weakness (generalized): Secondary | ICD-10-CM | POA: Diagnosis not present

## 2018-10-07 DIAGNOSIS — R488 Other symbolic dysfunctions: Secondary | ICD-10-CM | POA: Diagnosis not present

## 2018-10-07 DIAGNOSIS — R41841 Cognitive communication deficit: Secondary | ICD-10-CM | POA: Diagnosis not present

## 2018-10-07 DIAGNOSIS — M25652 Stiffness of left hip, not elsewhere classified: Secondary | ICD-10-CM | POA: Diagnosis not present

## 2018-10-08 DIAGNOSIS — R41841 Cognitive communication deficit: Secondary | ICD-10-CM | POA: Diagnosis not present

## 2018-10-08 DIAGNOSIS — M25651 Stiffness of right hip, not elsewhere classified: Secondary | ICD-10-CM | POA: Diagnosis not present

## 2018-10-08 DIAGNOSIS — M25652 Stiffness of left hip, not elsewhere classified: Secondary | ICD-10-CM | POA: Diagnosis not present

## 2018-10-08 DIAGNOSIS — R2681 Unsteadiness on feet: Secondary | ICD-10-CM | POA: Diagnosis not present

## 2018-10-08 DIAGNOSIS — R2689 Other abnormalities of gait and mobility: Secondary | ICD-10-CM | POA: Diagnosis not present

## 2018-10-08 DIAGNOSIS — M6281 Muscle weakness (generalized): Secondary | ICD-10-CM | POA: Diagnosis not present

## 2018-10-08 DIAGNOSIS — R488 Other symbolic dysfunctions: Secondary | ICD-10-CM | POA: Diagnosis not present

## 2018-10-09 DIAGNOSIS — M25651 Stiffness of right hip, not elsewhere classified: Secondary | ICD-10-CM | POA: Diagnosis not present

## 2018-10-09 DIAGNOSIS — R41841 Cognitive communication deficit: Secondary | ICD-10-CM | POA: Diagnosis not present

## 2018-10-09 DIAGNOSIS — M25652 Stiffness of left hip, not elsewhere classified: Secondary | ICD-10-CM | POA: Diagnosis not present

## 2018-10-09 DIAGNOSIS — R2689 Other abnormalities of gait and mobility: Secondary | ICD-10-CM | POA: Diagnosis not present

## 2018-10-09 DIAGNOSIS — M6281 Muscle weakness (generalized): Secondary | ICD-10-CM | POA: Diagnosis not present

## 2018-10-09 DIAGNOSIS — R488 Other symbolic dysfunctions: Secondary | ICD-10-CM | POA: Diagnosis not present

## 2018-10-09 DIAGNOSIS — R2681 Unsteadiness on feet: Secondary | ICD-10-CM | POA: Diagnosis not present

## 2018-10-10 DIAGNOSIS — M6281 Muscle weakness (generalized): Secondary | ICD-10-CM | POA: Diagnosis not present

## 2018-10-10 DIAGNOSIS — R2681 Unsteadiness on feet: Secondary | ICD-10-CM | POA: Diagnosis not present

## 2018-10-10 DIAGNOSIS — M25652 Stiffness of left hip, not elsewhere classified: Secondary | ICD-10-CM | POA: Diagnosis not present

## 2018-10-10 DIAGNOSIS — M25651 Stiffness of right hip, not elsewhere classified: Secondary | ICD-10-CM | POA: Diagnosis not present

## 2018-10-10 DIAGNOSIS — R41841 Cognitive communication deficit: Secondary | ICD-10-CM | POA: Diagnosis not present

## 2018-10-10 DIAGNOSIS — R488 Other symbolic dysfunctions: Secondary | ICD-10-CM | POA: Diagnosis not present

## 2018-10-10 DIAGNOSIS — R2689 Other abnormalities of gait and mobility: Secondary | ICD-10-CM | POA: Diagnosis not present

## 2018-10-11 DIAGNOSIS — R2681 Unsteadiness on feet: Secondary | ICD-10-CM | POA: Diagnosis not present

## 2018-10-11 DIAGNOSIS — M25652 Stiffness of left hip, not elsewhere classified: Secondary | ICD-10-CM | POA: Diagnosis not present

## 2018-10-11 DIAGNOSIS — R41841 Cognitive communication deficit: Secondary | ICD-10-CM | POA: Diagnosis not present

## 2018-10-11 DIAGNOSIS — R2689 Other abnormalities of gait and mobility: Secondary | ICD-10-CM | POA: Diagnosis not present

## 2018-10-11 DIAGNOSIS — M6281 Muscle weakness (generalized): Secondary | ICD-10-CM | POA: Diagnosis not present

## 2018-10-11 DIAGNOSIS — M25651 Stiffness of right hip, not elsewhere classified: Secondary | ICD-10-CM | POA: Diagnosis not present

## 2018-10-11 DIAGNOSIS — R488 Other symbolic dysfunctions: Secondary | ICD-10-CM | POA: Diagnosis not present

## 2018-10-14 DIAGNOSIS — M25651 Stiffness of right hip, not elsewhere classified: Secondary | ICD-10-CM | POA: Diagnosis not present

## 2018-10-14 DIAGNOSIS — R2689 Other abnormalities of gait and mobility: Secondary | ICD-10-CM | POA: Diagnosis not present

## 2018-10-14 DIAGNOSIS — R41841 Cognitive communication deficit: Secondary | ICD-10-CM | POA: Diagnosis not present

## 2018-10-14 DIAGNOSIS — R488 Other symbolic dysfunctions: Secondary | ICD-10-CM | POA: Diagnosis not present

## 2018-10-14 DIAGNOSIS — R2681 Unsteadiness on feet: Secondary | ICD-10-CM | POA: Diagnosis not present

## 2018-10-14 DIAGNOSIS — M6281 Muscle weakness (generalized): Secondary | ICD-10-CM | POA: Diagnosis not present

## 2018-10-14 DIAGNOSIS — M25652 Stiffness of left hip, not elsewhere classified: Secondary | ICD-10-CM | POA: Diagnosis not present

## 2018-10-15 ENCOUNTER — Encounter: Payer: Self-pay | Admitting: *Deleted

## 2018-10-15 DIAGNOSIS — M6281 Muscle weakness (generalized): Secondary | ICD-10-CM | POA: Diagnosis not present

## 2018-10-15 DIAGNOSIS — R2681 Unsteadiness on feet: Secondary | ICD-10-CM | POA: Diagnosis not present

## 2018-10-15 DIAGNOSIS — R488 Other symbolic dysfunctions: Secondary | ICD-10-CM | POA: Diagnosis not present

## 2018-10-15 DIAGNOSIS — M25651 Stiffness of right hip, not elsewhere classified: Secondary | ICD-10-CM | POA: Diagnosis not present

## 2018-10-15 DIAGNOSIS — M25652 Stiffness of left hip, not elsewhere classified: Secondary | ICD-10-CM | POA: Diagnosis not present

## 2018-10-15 DIAGNOSIS — R2689 Other abnormalities of gait and mobility: Secondary | ICD-10-CM | POA: Diagnosis not present

## 2018-10-15 DIAGNOSIS — R41841 Cognitive communication deficit: Secondary | ICD-10-CM | POA: Diagnosis not present

## 2018-10-16 DIAGNOSIS — R2689 Other abnormalities of gait and mobility: Secondary | ICD-10-CM | POA: Diagnosis not present

## 2018-10-16 DIAGNOSIS — M25651 Stiffness of right hip, not elsewhere classified: Secondary | ICD-10-CM | POA: Diagnosis not present

## 2018-10-16 DIAGNOSIS — R488 Other symbolic dysfunctions: Secondary | ICD-10-CM | POA: Diagnosis not present

## 2018-10-16 DIAGNOSIS — R2681 Unsteadiness on feet: Secondary | ICD-10-CM | POA: Diagnosis not present

## 2018-10-16 DIAGNOSIS — R41841 Cognitive communication deficit: Secondary | ICD-10-CM | POA: Diagnosis not present

## 2018-10-16 DIAGNOSIS — M25652 Stiffness of left hip, not elsewhere classified: Secondary | ICD-10-CM | POA: Diagnosis not present

## 2018-10-16 DIAGNOSIS — M6281 Muscle weakness (generalized): Secondary | ICD-10-CM | POA: Diagnosis not present

## 2018-10-17 DIAGNOSIS — R41841 Cognitive communication deficit: Secondary | ICD-10-CM | POA: Diagnosis not present

## 2018-10-17 DIAGNOSIS — M25651 Stiffness of right hip, not elsewhere classified: Secondary | ICD-10-CM | POA: Diagnosis not present

## 2018-10-17 DIAGNOSIS — M25652 Stiffness of left hip, not elsewhere classified: Secondary | ICD-10-CM | POA: Diagnosis not present

## 2018-10-17 DIAGNOSIS — M6281 Muscle weakness (generalized): Secondary | ICD-10-CM | POA: Diagnosis not present

## 2018-10-17 DIAGNOSIS — R2681 Unsteadiness on feet: Secondary | ICD-10-CM | POA: Diagnosis not present

## 2018-10-17 DIAGNOSIS — R2689 Other abnormalities of gait and mobility: Secondary | ICD-10-CM | POA: Diagnosis not present

## 2018-10-17 DIAGNOSIS — R488 Other symbolic dysfunctions: Secondary | ICD-10-CM | POA: Diagnosis not present

## 2018-10-18 DIAGNOSIS — M25651 Stiffness of right hip, not elsewhere classified: Secondary | ICD-10-CM | POA: Diagnosis not present

## 2018-10-18 DIAGNOSIS — R41841 Cognitive communication deficit: Secondary | ICD-10-CM | POA: Diagnosis not present

## 2018-10-18 DIAGNOSIS — R2681 Unsteadiness on feet: Secondary | ICD-10-CM | POA: Diagnosis not present

## 2018-10-18 DIAGNOSIS — M25652 Stiffness of left hip, not elsewhere classified: Secondary | ICD-10-CM | POA: Diagnosis not present

## 2018-10-18 DIAGNOSIS — R2689 Other abnormalities of gait and mobility: Secondary | ICD-10-CM | POA: Diagnosis not present

## 2018-10-18 DIAGNOSIS — R488 Other symbolic dysfunctions: Secondary | ICD-10-CM | POA: Diagnosis not present

## 2018-10-18 DIAGNOSIS — M6281 Muscle weakness (generalized): Secondary | ICD-10-CM | POA: Diagnosis not present

## 2018-10-19 ENCOUNTER — Encounter (HOSPITAL_COMMUNITY): Payer: Self-pay

## 2018-10-19 ENCOUNTER — Emergency Department (HOSPITAL_COMMUNITY): Payer: Medicare PPO

## 2018-10-19 ENCOUNTER — Other Ambulatory Visit: Payer: Self-pay

## 2018-10-19 ENCOUNTER — Emergency Department (HOSPITAL_COMMUNITY)
Admission: EM | Admit: 2018-10-19 | Discharge: 2018-10-19 | Disposition: A | Discharge status: Home / Self Care | Payer: Medicare PPO | Attending: Emergency Medicine | Admitting: Emergency Medicine

## 2018-10-19 DIAGNOSIS — R279 Unspecified lack of coordination: Secondary | ICD-10-CM | POA: Diagnosis not present

## 2018-10-19 DIAGNOSIS — Z743 Need for continuous supervision: Secondary | ICD-10-CM | POA: Diagnosis not present

## 2018-10-19 DIAGNOSIS — M542 Cervicalgia: Secondary | ICD-10-CM | POA: Diagnosis not present

## 2018-10-19 DIAGNOSIS — Z79899 Other long term (current) drug therapy: Secondary | ICD-10-CM | POA: Diagnosis not present

## 2018-10-19 DIAGNOSIS — Z7982 Long term (current) use of aspirin: Secondary | ICD-10-CM | POA: Insufficient documentation

## 2018-10-19 DIAGNOSIS — F0390 Unspecified dementia without behavioral disturbance: Secondary | ICD-10-CM | POA: Insufficient documentation

## 2018-10-19 DIAGNOSIS — S0990XA Unspecified injury of head, initial encounter: Secondary | ICD-10-CM | POA: Diagnosis not present

## 2018-10-19 DIAGNOSIS — W19XXXA Unspecified fall, initial encounter: Secondary | ICD-10-CM

## 2018-10-19 DIAGNOSIS — S3993XA Unspecified injury of pelvis, initial encounter: Secondary | ICD-10-CM | POA: Diagnosis not present

## 2018-10-19 DIAGNOSIS — Z85828 Personal history of other malignant neoplasm of skin: Secondary | ICD-10-CM | POA: Insufficient documentation

## 2018-10-19 DIAGNOSIS — M25551 Pain in right hip: Secondary | ICD-10-CM | POA: Insufficient documentation

## 2018-10-19 DIAGNOSIS — I129 Hypertensive chronic kidney disease with stage 1 through stage 4 chronic kidney disease, or unspecified chronic kidney disease: Secondary | ICD-10-CM | POA: Diagnosis not present

## 2018-10-19 DIAGNOSIS — S199XXA Unspecified injury of neck, initial encounter: Secondary | ICD-10-CM | POA: Diagnosis not present

## 2018-10-19 DIAGNOSIS — Z87891 Personal history of nicotine dependence: Secondary | ICD-10-CM | POA: Diagnosis not present

## 2018-10-19 DIAGNOSIS — I10 Essential (primary) hypertension: Secondary | ICD-10-CM | POA: Diagnosis not present

## 2018-10-19 DIAGNOSIS — R51 Headache: Secondary | ICD-10-CM | POA: Diagnosis not present

## 2018-10-19 DIAGNOSIS — S79911A Unspecified injury of right hip, initial encounter: Secondary | ICD-10-CM | POA: Diagnosis not present

## 2018-10-19 DIAGNOSIS — N189 Chronic kidney disease, unspecified: Secondary | ICD-10-CM | POA: Diagnosis not present

## 2018-10-19 DIAGNOSIS — R0902 Hypoxemia: Secondary | ICD-10-CM | POA: Diagnosis not present

## 2018-10-19 MED ORDER — ACETAMINOPHEN 325 MG PO TABS
650.0000 mg | ORAL_TABLET | Freq: Once | ORAL | Status: AC
  Administered 2018-10-19: 650 mg via ORAL
  Filled 2018-10-19: qty 2

## 2018-10-19 NOTE — ED Notes (Signed)
Attempted to contact receiving facility 3x times. Was placed on hold for 10 mins. Following 2 attempts to contact receiving nurse the call was ended.

## 2018-10-19 NOTE — ED Provider Notes (Signed)
[facility] DEPT Provider Note   CSN: [id number] Arrival date & time: 10/19/18  1742     History   Chief Complaint Chief Complaint  Patient presents with   Fall    HPI Jamie George is a 73 y.o. female.     HPI Patient presents after a fall. Patient has dementia, but does provide details of her current condition seemingly appropriately. Level 5 caveat secondary to dementia. Patient fell hitting righ hip against a table, and striking her head, neck, she had the floor. She complains of head pain, neck pain, hip pain. No loss of sensation, function in her legs. Patient states that she was well prior to the onset. Staff reports the patient is interacting in a typical manner.  Past Medical History:  Diagnosis Date   Cancer ([place])    skin   Complication of anesthesia    " difficult to wake me up "   Depression    Duodenitis    GERD (gastroesophageal reflux disease)    Insomnia    Migraines    Mild dementia ([place])     Patient Active Problem List   Diagnosis Date Noted   Dementia ([place]) 01/02/2018   Aggression 01/02/2018   Dissection of abdominal aorta ([place]) 08/13/2017   Chest pain 08/13/2017   Hypertension 08/13/2017   CKD (chronic kidney disease) 08/13/2017   Anemia 08/13/2017    Past Surgical History:  Procedure Laterality Date   ABDOMINAL HYSTERECTOMY     BACK SURGERY     BIOPSY  03/04/2018   Procedure: BIOPSY;  Surgeon: Otis Brace, MD;  Location: [facility];  Service: Gastroenterology;;   BRAIN SURGERY  2013   meningioma   ENTEROSCOPY N/A 03/04/2018   Procedure: PUSH ENTEROSCOPY;  Surgeon: Otis Brace, MD;  Location: [facility];  Service: Gastroenterology;  Laterality: N/A;   SHOULDER SURGERY       OB History   No obstetric history on file.      Home Medications    Prior to Admission medications   Medication Sig Start Date End Date Taking? Authorizing Provider    acetaminophen (TYLENOL 8 HOUR) 650 MG CR tablet Take 1 tablet (650 mg total) by mouth every 8 (eight) hours as needed for pain. Patient taking differently: Take 650 mg by mouth every 6 (six) hours as needed for pain.  03/31/18  Yes Petrucelli, Glynda Jaeger, PA-C  aspirin EC 81 MG EC tablet Take 1 tablet (81 mg total) by mouth daily. With food 08/15/17  Yes Emokpae, Courage, MD  atorvastatin (LIPITOR) 40 MG tablet Take 40 mg by mouth 2 (two) times a week. Sundays and Thursdays   Yes [provider]  donepezil (ARICEPT) 10 MG tablet Take 10 mg by mouth at bedtime.   Yes [provider]  escitalopram (LEXAPRO) 20 MG tablet Take 20 mg by mouth daily.  05/03/17  Yes [provider]  ferrous sulfate 325 (65 FE) MG tablet Take 325 mg by mouth 2 (two) times daily with a meal.   Yes [provider]  fluticasone (FLONASE) 50 MCG/ACT nasal spray Place 1 spray into both nostrils daily.   Yes [provider]  Melatonin 5 MG TABS Take 5 mg by mouth at bedtime.   Yes [provider]  memantine (NAMENDA) 10 MG tablet Take 1 tablet (10 mg total) by mouth 2 (two) times daily. 12/17/17  Yes Penumalli, Earlean Polka, MD  mirtazapine (REMERON) 7.5 MG tablet Take 7.5 mg by mouth at bedtime.  Yes [provider]  pantoprazole (PROTONIX) 40 MG tablet Take 1 tablet (40 mg total) by mouth 2 (two) times daily before a meal. 08/14/17 10/19/18 Yes Emokpae, Courage, MD  polyethylene glycol (MIRALAX / GLYCOLAX) packet Take 17 g by mouth daily.   Yes [provider]  risperiDONE (RISPERDAL) 0.5 MG tablet Take 0.5 mg by mouth at bedtime.   Yes [provider]  traZODone (DESYREL) 50 MG tablet Take 50 mg by mouth at bedtime.  05/03/17  Yes [provider]  verapamil (CALAN-SR) 120 MG CR tablet Take 120 mg by mouth daily.   Yes [provider]  zonisamide (ZONEGRAN) 100 MG capsule Take 100 mg by mouth at bedtime.    Yes [provider]   HYDROcodone-acetaminophen (NORCO/VICODIN) 5-325 MG tablet Take 1 tablet by mouth every 6 (six) hours as needed for severe pain. Patient not taking: Reported on 10/19/2018 02/23/18   Blanchie Dessert, MD  ondansetron (ZOFRAN) 4 MG tablet Take 1 tablet (4 mg total) by mouth every 6 (six) hours as needed for nausea. Patient not taking: Reported on 10/19/2018 08/14/17   Roxan Hockey, MD    Family History Family History  Problem Relation Age of Onset   Cancer Mother    Stroke Father    Heart disease Father     Social History Social History   Tobacco Use   Smoking status: Former Smoker   Smokeless tobacco: Former Systems developer  Substance Use Topics   Alcohol use: Not Currently   Drug use: Never     Allergies   Patient has no known allergies.   Review of Systems Review of Systems  Unable to perform ROS: Dementia     Physical Exam Updated Vital Signs BP (!) 183/122    Pulse 99    Temp 98.4 F (36.9 C)    Resp 16    Ht 5\' 7"  (1.702 m)    Wt 71.7 kg    SpO2 97%    BMI 24.75 kg/m   Physical Exam Vitals signs and nursing note reviewed.  Constitutional:      General: She is not in acute distress.    Appearance: She is well-developed.  HENT:     Head: Normocephalic and atraumatic.  Eyes:     Conjunctiva/sclera: Conjunctivae normal.  Neck:     Comments: Cervical collar in place, though getting above her chin as well.  No gross deformities. Cardiovascular:     Rate and Rhythm: Normal rate and regular rhythm.  Pulmonary:     Effort: Pulmonary effort is normal. No respiratory distress.     Breath sounds: Normal breath sounds. No stridor.  Abdominal:     General: There is no distension.  Musculoskeletal:       Legs:  Skin:    General: Skin is warm and dry.  Neurological:     Mental Status: She is alert.     Cranial Nerves: No cranial nerve deficit.     Motor: Atrophy present.     Comments: No gross asymmetry of strength, sensation.  Psychiatric:        Cognition  and Memory: Cognition is impaired.      ED Treatments / Results  Labs (all labs ordered are listed, but only abnormal results are displayed) Labs Reviewed - No data to display  EKG None  Radiology Ct Head Wo Contrast  Result Date: 10/19/2018 CLINICAL DATA:  73 year old female with fall. EXAM: CT HEAD WITHOUT CONTRAST CT CERVICAL SPINE WITHOUT CONTRAST TECHNIQUE: Multidetector  CT imaging of the head and cervical spine was performed following the standard protocol without intravenous contrast. Multiplanar CT image reconstructions of the cervical spine were also generated. COMPARISON:  Head CT dated 01/01/2018 FINDINGS: CT HEAD FINDINGS Brain: There is mild to moderate age-related atrophy and chronic microvascular ischemic changes. 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 There is no acute intracranial hemorrhage. No mass effect or midline shift. No extra-axial fluid collection. Vascular: No hyperdense vessel or unexpected calcification. Skull: No acute calvarial pathology. Left frontal craniotomy changes. Sinuses/Orbits: No acute finding. Other: None CT CERVICAL SPINE FINDINGS Alignment: No acute subluxation. There is reversal of normal cervical lordosis which may be positional or due to muscle spasm or secondary to degenerative changes Skull base and vertebrae: No acute fracture. 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 Soft tissues and spinal canal: No prevertebral fluid or swelling. No visible canal hematoma. Disc levels: Multilevel degenerative changes with endplate irregularity and disc space narrowing most prominent at C5-C6 and C6-C7. Upper chest: Negative. Other: Slight heterogeneity the thyroid gland. Mild bilateral carotid bulb calcified plaques. IMPRESSION: 1. No acute intracranial hemorrhage. 2. Mild to moderate age-related atrophy and chronic microvascular ischemic changes. 3. No acute/traumatic cervical spine pathology. Electronically Signed   By: Anner Crete M.D.   On: 10/19/2018 20:21    Ct Cervical Spine Wo Contrast  Result Date: 10/19/2018 CLINICAL DATA:  73 year old female with fall. EXAM: CT HEAD WITHOUT CONTRAST CT CERVICAL SPINE WITHOUT CONTRAST TECHNIQUE: Multidetector CT imaging of the head and cervical spine was performed following the standard protocol without intravenous contrast. Multiplanar CT image reconstructions of the cervical spine were also generated. COMPARISON:  Head CT dated 01/01/2018 FINDINGS: CT HEAD FINDINGS Brain: There is mild to moderate age-related atrophy and chronic microvascular ischemic changes. 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 There is no acute intracranial hemorrhage. No mass effect or midline shift. No extra-axial fluid collection. Vascular: No hyperdense vessel or unexpected calcification. Skull: No acute calvarial pathology. Left frontal craniotomy changes. Sinuses/Orbits: No acute finding. Other: None CT CERVICAL SPINE FINDINGS Alignment: No acute subluxation. There is reversal of normal cervical lordosis which may be positional or due to muscle spasm or secondary to degenerative changes Skull base and vertebrae: No acute fracture. 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 Soft tissues and spinal canal: No prevertebral fluid or swelling. No visible canal hematoma. Disc levels: Multilevel degenerative changes with endplate irregularity and disc space narrowing most prominent at C5-C6 and C6-C7. Upper chest: Negative. Other: Slight heterogeneity the thyroid gland. Mild bilateral carotid bulb calcified plaques. IMPRESSION: 1. No acute intracranial hemorrhage. 2. Mild to moderate age-related atrophy and chronic microvascular ischemic changes. 3. No acute/traumatic cervical spine pathology. Electronically Signed   By: Anner Crete M.D.   On: 10/19/2018 20:21   Ct Pelvis Wo Contrast  Result Date: 10/19/2018 CLINICAL DATA:  Pelvic trauma EXAM: CT PELVIS WITHOUT CONTRAST TECHNIQUE: Multidetector CT imaging of the pelvis was performed following the  standard protocol without intravenous contrast. COMPARISON:  None. FINDINGS: Urinary Tract:  No abnormality visualized. Bowel: There are few scattered colonic diverticula without CT evidence for diverticulitis involving the visualized portions of the colon. Vascular/Lymphatic: There are few vascular calcifications. Reproductive:  The patient appears to be status post hysterectomy. Other:  None. Musculoskeletal: No suspicious bone lesions identified. IMPRESSION: No acute displaced fracture or dislocation. Electronically Signed   By: Constance Holster M.D.   On: 10/19/2018 20:14   Dg Hip Unilat  With Pelvis 2-3 Views Right  Result Date: 10/19/2018  CLINICAL DATA:  Fall striking right hip on the table EXAM: DG HIP (WITH OR WITHOUT PELVIS) 2-3V RIGHT COMPARISON:  03/31/2018 radiographs and CT. FINDINGS: There is no evidence of hip fracture or dislocation. There is no evidence of arthropathy. Lower lumbar spondylosis and likely scoliosis. IMPRESSION: 1. No acute hip findings. Electronically Signed   By: Van Clines M.D.   On: 10/19/2018 18:28    Procedures Procedures (including critical care time)  Medications Ordered in ED Medications  acetaminophen (TYLENOL) tablet 650 mg (650 mg Oral Given 10/19/18 2050)     Initial Impression / Assessment and Plan / ED Course  I have reviewed the triage vital signs and the nursing notes.  Pertinent labs & imaging results that were available during my care of the patient were reviewed by me and considered in my medical decision making (see chart for details).        8:53 PM Patient in no distress, awake, alert, speaking clearly. Vital signs remain unremarkable. With no CT or x-ray evidence for fracture, no new neurologic deficiencies, patient is appropriate for discharge with outpatient follow-up following a fall.   Final Clinical Impressions(s) / ED Diagnoses   Final diagnoses:  Fall, initial encounter     Carmin Muskrat, MD 10/19/18  2054

## 2018-10-19 NOTE — ED Triage Notes (Signed)
Per EMS, Pt is coming from [facility]. Pt has a witnessed fall. Pt fell hit her right hip on a table, and then fell back on to her butt on. Pt did not hit her head, or have any LOC. Pt complaining of neck, back, and hip pain. No obvious deformity, shortening, or rotation noted. Pts behavior is baseline per staff.

## 2018-10-19 NOTE — Discharge Instructions (Signed)
As discussed, your evaluation today has been largely reassuring.  But, it is important that you monitor your condition carefully, and do not hesitate to return to the ED if you develop new, or concerning changes in your condition. ? ?Otherwise, please follow-up with your physician for appropriate ongoing care. ? ?

## 2018-10-21 DIAGNOSIS — R488 Other symbolic dysfunctions: Secondary | ICD-10-CM | POA: Diagnosis not present

## 2018-10-21 DIAGNOSIS — M6281 Muscle weakness (generalized): Secondary | ICD-10-CM | POA: Diagnosis not present

## 2018-10-21 DIAGNOSIS — R2689 Other abnormalities of gait and mobility: Secondary | ICD-10-CM | POA: Diagnosis not present

## 2018-10-21 DIAGNOSIS — M25652 Stiffness of left hip, not elsewhere classified: Secondary | ICD-10-CM | POA: Diagnosis not present

## 2018-10-21 DIAGNOSIS — M25651 Stiffness of right hip, not elsewhere classified: Secondary | ICD-10-CM | POA: Diagnosis not present

## 2018-10-21 DIAGNOSIS — R41841 Cognitive communication deficit: Secondary | ICD-10-CM | POA: Diagnosis not present

## 2018-10-21 DIAGNOSIS — R2681 Unsteadiness on feet: Secondary | ICD-10-CM | POA: Diagnosis not present

## 2018-10-22 DIAGNOSIS — R2681 Unsteadiness on feet: Secondary | ICD-10-CM | POA: Diagnosis not present

## 2018-10-22 DIAGNOSIS — R488 Other symbolic dysfunctions: Secondary | ICD-10-CM | POA: Diagnosis not present

## 2018-10-22 DIAGNOSIS — M25651 Stiffness of right hip, not elsewhere classified: Secondary | ICD-10-CM | POA: Diagnosis not present

## 2018-10-22 DIAGNOSIS — R41841 Cognitive communication deficit: Secondary | ICD-10-CM | POA: Diagnosis not present

## 2018-10-22 DIAGNOSIS — R2689 Other abnormalities of gait and mobility: Secondary | ICD-10-CM | POA: Diagnosis not present

## 2018-10-22 DIAGNOSIS — M6281 Muscle weakness (generalized): Secondary | ICD-10-CM | POA: Diagnosis not present

## 2018-10-22 DIAGNOSIS — M25652 Stiffness of left hip, not elsewhere classified: Secondary | ICD-10-CM | POA: Diagnosis not present

## 2018-10-23 DIAGNOSIS — M6281 Muscle weakness (generalized): Secondary | ICD-10-CM | POA: Diagnosis not present

## 2018-10-23 DIAGNOSIS — M25651 Stiffness of right hip, not elsewhere classified: Secondary | ICD-10-CM | POA: Diagnosis not present

## 2018-10-23 DIAGNOSIS — R2689 Other abnormalities of gait and mobility: Secondary | ICD-10-CM | POA: Diagnosis not present

## 2018-10-23 DIAGNOSIS — R2681 Unsteadiness on feet: Secondary | ICD-10-CM | POA: Diagnosis not present

## 2018-10-23 DIAGNOSIS — R41841 Cognitive communication deficit: Secondary | ICD-10-CM | POA: Diagnosis not present

## 2018-10-23 DIAGNOSIS — M25652 Stiffness of left hip, not elsewhere classified: Secondary | ICD-10-CM | POA: Diagnosis not present

## 2018-10-23 DIAGNOSIS — R488 Other symbolic dysfunctions: Secondary | ICD-10-CM | POA: Diagnosis not present

## 2018-10-24 DIAGNOSIS — M25651 Stiffness of right hip, not elsewhere classified: Secondary | ICD-10-CM | POA: Diagnosis not present

## 2018-10-24 DIAGNOSIS — M6281 Muscle weakness (generalized): Secondary | ICD-10-CM | POA: Diagnosis not present

## 2018-10-24 DIAGNOSIS — R488 Other symbolic dysfunctions: Secondary | ICD-10-CM | POA: Diagnosis not present

## 2018-10-24 DIAGNOSIS — R2689 Other abnormalities of gait and mobility: Secondary | ICD-10-CM | POA: Diagnosis not present

## 2018-10-24 DIAGNOSIS — R2681 Unsteadiness on feet: Secondary | ICD-10-CM | POA: Diagnosis not present

## 2018-10-24 DIAGNOSIS — R41841 Cognitive communication deficit: Secondary | ICD-10-CM | POA: Diagnosis not present

## 2018-10-24 DIAGNOSIS — M25652 Stiffness of left hip, not elsewhere classified: Secondary | ICD-10-CM | POA: Diagnosis not present

## 2018-10-31 DIAGNOSIS — R2689 Other abnormalities of gait and mobility: Secondary | ICD-10-CM | POA: Diagnosis not present

## 2018-10-31 DIAGNOSIS — R488 Other symbolic dysfunctions: Secondary | ICD-10-CM | POA: Diagnosis not present

## 2018-10-31 DIAGNOSIS — M25652 Stiffness of left hip, not elsewhere classified: Secondary | ICD-10-CM | POA: Diagnosis not present

## 2018-10-31 DIAGNOSIS — M25651 Stiffness of right hip, not elsewhere classified: Secondary | ICD-10-CM | POA: Diagnosis not present

## 2018-10-31 DIAGNOSIS — R41841 Cognitive communication deficit: Secondary | ICD-10-CM | POA: Diagnosis not present

## 2018-10-31 DIAGNOSIS — M6281 Muscle weakness (generalized): Secondary | ICD-10-CM | POA: Diagnosis not present

## 2018-10-31 DIAGNOSIS — R2681 Unsteadiness on feet: Secondary | ICD-10-CM | POA: Diagnosis not present

## 2018-11-05 DIAGNOSIS — M25652 Stiffness of left hip, not elsewhere classified: Secondary | ICD-10-CM | POA: Diagnosis not present

## 2018-11-05 DIAGNOSIS — R41841 Cognitive communication deficit: Secondary | ICD-10-CM | POA: Diagnosis not present

## 2018-11-05 DIAGNOSIS — M6281 Muscle weakness (generalized): Secondary | ICD-10-CM | POA: Diagnosis not present

## 2018-11-05 DIAGNOSIS — R488 Other symbolic dysfunctions: Secondary | ICD-10-CM | POA: Diagnosis not present

## 2018-11-05 DIAGNOSIS — R2681 Unsteadiness on feet: Secondary | ICD-10-CM | POA: Diagnosis not present

## 2018-11-05 DIAGNOSIS — M25651 Stiffness of right hip, not elsewhere classified: Secondary | ICD-10-CM | POA: Diagnosis not present

## 2018-11-05 DIAGNOSIS — R2689 Other abnormalities of gait and mobility: Secondary | ICD-10-CM | POA: Diagnosis not present

## 2018-11-21 ENCOUNTER — Encounter (HOSPITAL_COMMUNITY): Payer: Self-pay

## 2018-11-21 ENCOUNTER — Emergency Department (HOSPITAL_COMMUNITY): Payer: Medicare PPO

## 2018-11-21 ENCOUNTER — Other Ambulatory Visit: Payer: Self-pay

## 2018-11-21 ENCOUNTER — Emergency Department (HOSPITAL_COMMUNITY)
Admission: EM | Admit: 2018-11-21 | Discharge: 2018-11-21 | Disposition: A | Discharge status: Home / Self Care | Payer: Medicare PPO | Attending: Emergency Medicine | Admitting: Emergency Medicine

## 2018-11-21 DIAGNOSIS — N189 Chronic kidney disease, unspecified: Secondary | ICD-10-CM | POA: Diagnosis not present

## 2018-11-21 DIAGNOSIS — F419 Anxiety disorder, unspecified: Secondary | ICD-10-CM | POA: Diagnosis not present

## 2018-11-21 DIAGNOSIS — F0390 Unspecified dementia without behavioral disturbance: Secondary | ICD-10-CM | POA: Insufficient documentation

## 2018-11-21 DIAGNOSIS — R5381 Other malaise: Secondary | ICD-10-CM | POA: Diagnosis not present

## 2018-11-21 DIAGNOSIS — Z79899 Other long term (current) drug therapy: Secondary | ICD-10-CM | POA: Diagnosis not present

## 2018-11-21 DIAGNOSIS — I129 Hypertensive chronic kidney disease with stage 1 through stage 4 chronic kidney disease, or unspecified chronic kidney disease: Secondary | ICD-10-CM | POA: Diagnosis not present

## 2018-11-21 DIAGNOSIS — R0602 Shortness of breath: Secondary | ICD-10-CM | POA: Diagnosis not present

## 2018-11-21 DIAGNOSIS — Z7982 Long term (current) use of aspirin: Secondary | ICD-10-CM | POA: Insufficient documentation

## 2018-11-21 DIAGNOSIS — Z85828 Personal history of other malignant neoplasm of skin: Secondary | ICD-10-CM | POA: Insufficient documentation

## 2018-11-21 DIAGNOSIS — F319 Bipolar disorder, unspecified: Secondary | ICD-10-CM | POA: Insufficient documentation

## 2018-11-21 DIAGNOSIS — R069 Unspecified abnormalities of breathing: Secondary | ICD-10-CM | POA: Diagnosis not present

## 2018-11-21 DIAGNOSIS — R457 State of emotional shock and stress, unspecified: Secondary | ICD-10-CM | POA: Diagnosis not present

## 2018-11-21 DIAGNOSIS — Z87891 Personal history of nicotine dependence: Secondary | ICD-10-CM | POA: Diagnosis not present

## 2018-11-21 LAB — CBC WITH DIFFERENTIAL/PLATELET
Abs Immature Granulocytes: 0.04 10*3/uL (ref 0.00–0.07)
Basophils Absolute: 0.1 10*3/uL (ref 0.0–0.1)
Basophils Relative: 1 %
Eosinophils Absolute: 0.3 10*3/uL (ref 0.0–0.5)
Eosinophils Relative: 3 %
HCT: 51.5 % — ABNORMAL HIGH (ref 36.0–46.0)
Hemoglobin: 15.8 g/dL — ABNORMAL HIGH (ref 12.0–15.0)
Immature Granulocytes: 0 %
Lymphocytes Relative: 16 %
Lymphs Abs: 1.6 10*3/uL (ref 0.7–4.0)
MCH: 28.7 pg (ref 26.0–34.0)
MCHC: 30.7 g/dL (ref 30.0–36.0)
MCV: 93.5 fL (ref 80.0–100.0)
Monocytes Absolute: 1 10*3/uL (ref 0.1–1.0)
Monocytes Relative: 10 %
Neutro Abs: 6.8 10*3/uL (ref 1.7–7.7)
Neutrophils Relative %: 70 %
Platelets: 284 10*3/uL (ref 150–400)
RBC: 5.51 MIL/uL — ABNORMAL HIGH (ref 3.87–5.11)
RDW: 14.8 % (ref 11.5–15.5)
WBC: 9.8 10*3/uL (ref 4.0–10.5)
nRBC: 0 % (ref 0.0–0.2)

## 2018-11-21 LAB — BASIC METABOLIC PANEL
Anion gap: 7 (ref 5–15)
BUN: 15 mg/dL (ref 8–23)
CO2: 25 mmol/L (ref 22–32)
Calcium: 9.1 mg/dL (ref 8.9–10.3)
Chloride: 108 mmol/L (ref 98–111)
Creatinine, Ser: 1.13 mg/dL — ABNORMAL HIGH (ref 0.44–1.00)
GFR calc Af Amer: 55 mL/min — ABNORMAL LOW (ref >60)
GFR calc non Af Amer: 48 mL/min — ABNORMAL LOW (ref >60)
Glucose, Bld: 90 mg/dL (ref 70–99)
Potassium: 3.7 mmol/L (ref 3.5–5.1)
Sodium: 140 mmol/L (ref 135–145)

## 2018-11-21 LAB — D-DIMER, QUANTITATIVE: D-Dimer, Quant: 0.68 ug/mL-FEU — ABNORMAL HIGH (ref 0.00–0.50)

## 2018-11-21 MED ORDER — ACETAMINOPHEN 325 MG PO TABS
650.0000 mg | ORAL_TABLET | Freq: Once | ORAL | Status: AC
  Administered 2018-11-21: 650 mg via ORAL
  Filled 2018-11-21: qty 2

## 2018-11-21 NOTE — BH Assessment (Addendum)
[facility] Assessment Progress Note  Per Shuvon Rankin, FNP, this voluntary pt does not require psychiatric hospitalization at this time.  Pt is psychiatrically cleared.  No behavioral health referrals are indicated for this pt.  Pt's nurse has been notified.  Jalene Mullet, [facility] Triage Specialist [phone]

## 2018-11-21 NOTE — ED Notes (Signed)
Pt asked this nurse "Are you my mommy? No? My mommy is dead"  Pt began breathing harder stating she cannot breathe.

## 2018-11-21 NOTE — ED Provider Notes (Signed)
[facility] DEPT Provider Note   CSN: [id number] Arrival date & time: 11/21/18  0520     History   Chief Complaint Chief Complaint  Patient presents with  . Shortness of Breath    HPI Jamie George is a 73 y.o. female.     The history is provided by the patient and medical records. No language interpreter was used.  Shortness of Breath  Jamie George is a 73 y.o. female who presents to the Emergency Department complaining of sob. Level V caveat due to dementia. History is provided by the patient. She presents to the emergency department complaining of 24 hours of shortness of breath. Symptoms are constant and occur at rest and with activity. She denies any fevers, chest pain, cough, abdominal pain, nausea, vomiting, diarrhea, leg swelling or pain. She denies any prior similar symptoms. She does state that she feels somewhat anxious. She denies any prior similar symptoms. Past Medical History:  Diagnosis Date  . Cancer ([place])    skin  . Complication of anesthesia    " difficult to wake me up "  . Depression   . Duodenitis   . GERD (gastroesophageal reflux disease)   . Insomnia   . Migraines   . Mild dementia [facility])     Patient Active Problem List   Diagnosis Date Noted  . Dementia ([place]) 01/02/2018  . Aggression 01/02/2018  . Dissection of abdominal aorta ([place]) 08/13/2017  . Chest pain 08/13/2017  . Hypertension 08/13/2017  . CKD (chronic kidney disease) 08/13/2017  . Anemia 08/13/2017    Past Surgical History:  Procedure Laterality Date  . ABDOMINAL HYSTERECTOMY    . BACK SURGERY    . BIOPSY  03/04/2018   Procedure: BIOPSY;  Surgeon: Otis Brace, MD;  Location: [facility];  Service: Gastroenterology;;  . BRAIN SURGERY  2013   meningioma  . ENTEROSCOPY N/A 03/04/2018   Procedure: PUSH ENTEROSCOPY;  Surgeon: Otis Brace, MD;  Location: [facility];  Service: Gastroenterology;  Laterality: N/A;  . SHOULDER  SURGERY       OB History   No obstetric history on file.      Home Medications    Prior to Admission medications   Medication Sig Start Date End Date Taking? Authorizing Provider  acetaminophen (TYLENOL 8 HOUR) 650 MG CR tablet Take 1 tablet (650 mg total) by mouth every 8 (eight) hours as needed for pain. Patient taking differently: Take 650 mg by mouth every 6 (six) hours as needed for pain.  03/31/18  Yes Petrucelli, Glynda Jaeger, PA-C  aspirin EC 81 MG EC tablet Take 1 tablet (81 mg total) by mouth daily. With food 08/15/17  Yes Emokpae, Courage, MD  atorvastatin (LIPITOR) 40 MG tablet Take 40 mg by mouth 2 (two) times a week. Sundays and Thursdays   Yes [provider]  donepezil (ARICEPT) 10 MG tablet Take 10 mg by mouth at bedtime.   Yes [provider]  escitalopram (LEXAPRO) 20 MG tablet Take 20 mg by mouth daily.  05/03/17  Yes [provider]  ferrous sulfate 325 (65 FE) MG tablet Take 325 mg by mouth 2 (two) times daily with a meal.   Yes [provider]  fluticasone (FLONASE) 50 MCG/ACT nasal spray Place 1 spray into both nostrils daily.   Yes [provider]  Melatonin-Theanine (MELATONIN PLUS L-THEANINE) 10-5.5 MG TABS Take 1 tablet by mouth at bedtime.   Yes [provider]  memantine (NAMENDA) 10 MG  tablet Take 1 tablet (10 mg total) by mouth 2 (two) times daily. 12/17/17  Yes Penumalli, Earlean Polka, MD  mirtazapine (REMERON) 7.5 MG tablet Take 7.5 mg by mouth at bedtime.   Yes [provider]  pantoprazole (PROTONIX) 40 MG tablet Take 1 tablet (40 mg total) by mouth 2 (two) times daily before a meal. 08/14/17 11/21/18 Yes Emokpae, Courage, MD  polyethylene glycol (MIRALAX / GLYCOLAX) packet Take 17 g by mouth daily.   Yes [provider]  risperiDONE (RISPERDAL) 0.5 MG tablet Take 0.5 mg by mouth at bedtime.   Yes [provider]  traZODone (DESYREL) 50 MG tablet Take 50 mg by mouth at bedtime.  05/03/17   Yes [provider]  verapamil (CALAN-SR) 120 MG CR tablet Take 120 mg by mouth daily.   Yes [provider]  zonisamide (ZONEGRAN) 100 MG capsule Take 100 mg by mouth at bedtime.    Yes [provider]  HYDROcodone-acetaminophen (NORCO/VICODIN) 5-325 MG tablet Take 1 tablet by mouth every 6 (six) hours as needed for severe pain. Patient not taking: Reported on 10/19/2018 02/23/18   Blanchie Dessert, MD  ondansetron (ZOFRAN) 4 MG tablet Take 1 tablet (4 mg total) by mouth every 6 (six) hours as needed for nausea. Patient not taking: Reported on 10/19/2018 08/14/17   Roxan Hockey, MD    Family History Family History  Problem Relation Age of Onset  . Cancer Mother   . Stroke Father   . Heart disease Father     Social History Social History   Tobacco Use  . Smoking status: Former Research scientist (life sciences)  . Smokeless tobacco: Former Network engineer Use Topics  . Alcohol use: Not Currently  . Drug use: Never     Allergies   Patient has no known allergies.   Review of Systems Review of Systems  Respiratory: Positive for shortness of breath.   All other systems reviewed and are negative.    Physical Exam Updated Vital Signs BP (!) 172/93 (BP Location: Right Arm)   Pulse (!) 58   Temp 98.1 F (36.7 C) (Oral)   Resp 20   SpO2 96%   Physical Exam Vitals signs and nursing note reviewed.  Constitutional:      Appearance: She is well-developed.  HENT:     Head: Normocephalic and atraumatic.  Cardiovascular:     Rate and Rhythm: Normal rate and regular rhythm.     Heart sounds: No murmur.  Pulmonary:     Effort: Pulmonary effort is normal. No respiratory distress.     Breath sounds: Normal breath sounds.  Abdominal:     Palpations: Abdomen is soft.     Tenderness: There is no abdominal tenderness. There is no guarding or rebound.  Musculoskeletal:        General: No swelling or tenderness.  Skin:    General: Skin is warm and dry.     Capillary Refill:  Capillary refill takes less than 2 seconds.  Neurological:     Mental Status: She is alert and oriented to person, place, and time.     Comments: Mildly confused.    Psychiatric:     Comments: Anxious appearing.        ED Treatments / Results  Labs (all labs ordered are listed, but only abnormal results are displayed) Labs Reviewed  BASIC METABOLIC PANEL - Abnormal; Notable for the following components:      Result Value   Creatinine, Ser 1.13 (*)    GFR  calc non Af Amer 48 (*)    GFR calc Af Amer 55 (*)    All other components within normal limits  CBC WITH DIFFERENTIAL/PLATELET - Abnormal; Notable for the following components:   RBC 5.51 (*)    Hemoglobin 15.8 (*)    HCT 51.5 (*)    All other components within normal limits  D-DIMER, QUANTITATIVE (NOT AT [facility]) - Abnormal; Notable for the following components:   D-Dimer, Quant 0.68 (*)    All other components within normal limits    EKG EKG Interpretation  Date/Time:  Thursday November 21 2018 05:39:08 EDT Ventricular Rate:  79 PR Interval:    QRS Duration: 77 QT Interval:  527 QTC Calculation: 605 R Axis:   76 Text Interpretation:  Sinus rhythm Borderline T abnormalities, lateral leads Prolonged QT interval Artifact Confirmed by Quintella Reichert [phone]) on 11/21/2018 8:15:56 AM   Radiology Dg Chest 2 View  Result Date: 11/21/2018 CLINICAL DATA:  Shortness of breath EXAM: CHEST - 2 VIEW COMPARISON:  01/01/2018 FINDINGS: Normal heart size and mediastinal contours. No acute infiltrate or edema. No effusion or pneumothorax. No acute osseous findings. IMPRESSION: No active cardiopulmonary disease. Electronically Signed   By: Monte Fantasia M.D.   On: 11/21/2018 06:21    Procedures Procedures (including critical care time)  Medications Ordered in ED Medications  acetaminophen (TYLENOL) tablet 650 mg (650 mg Oral Given 11/21/18 1033)     Initial Impression / Assessment and Plan / ED Course  I have reviewed the  triage vital signs and the nursing notes.  Pertinent labs & imaging results that were available during my care of the patient were reviewed by me and considered in my medical decision making (see chart for details).       patient with history of dementia here for evaluation of shortness of breath. She is anxious appearing on evaluation but not in any acute distress. No evidence of pneumonia. Labs are stable when compared to priors. There is mild elevation and D dimer but she is low risk for PE based on age adjustment. Presentation is not consistent with ACS, PE, pneumonia, pneumothorax. Initially unable to contact the daughter blood per report the daughter wanted psychiatry involved. A TTS consulted was made and she was found to not be appropriate for inpatient psych. Eventually able to contact the patient's daughter, who states the patient has outpatient psychiatry available. Plan to discharge home to [facility] with outpatient psychiatric evaluation and return precautions. Final Clinical Impressions(s) / ED Diagnoses   Final diagnoses:  Shortness of breath  Anxiety    ED Discharge Orders    None       Quintella Reichert, MD 11/21/18 1432

## 2018-11-21 NOTE — ED Notes (Signed)
Daughter, Velna Ochs left message that she would like the doctor or the nurse to call her. [phone]

## 2018-11-21 NOTE — ED Notes (Signed)
Pt working crossword puzzle as this nurse entered the room, when asking triage questions patient states she has been gasping for air all night. Pt at 96% in no distress at this time.

## 2018-11-21 NOTE — ED Triage Notes (Signed)
Pt arrived via [facility] with complaints of shortness of breath. Pt 96% on room air. Per EMS she has a history of anxiety and a history of dementia although A&O.

## 2018-11-21 NOTE — BH Assessment (Signed)
Tele Assessment Note   Patient Name: Jamie George MRN: [id number] Referring Physician: Dr. Ralene Bathe Location of Patient: [facility] Location of Provider: [place]  Jamie George is an 73 y.o. female. The Pt has been diagnosed with dementia. Pt denies SI/HI and AVH. Pt was brought in by her ALF because the Pt reports breathing issues and stated that she wanted her mommy. The Pt's mother has passed away. The Pt was oriented x4. The Pt has been hospitalized previously for dementia.   [place], NP recommends psych clearance.   Diagnosis:  Dementia  Past Medical History:  Past Medical History:  Diagnosis Date  . Cancer ([place])    skin  . Complication of anesthesia    " difficult to wake me up "  . Depression   . Duodenitis   . GERD (gastroesophageal reflux disease)   . Insomnia   . Migraines   . Mild dementia [facility])     Past Surgical History:  Procedure Laterality Date  . ABDOMINAL HYSTERECTOMY    . BACK SURGERY    . BIOPSY  03/04/2018   Procedure: BIOPSY;  Surgeon: Otis Brace, MD;  [facility];  Service: Gastroenterology;;  . BRAIN SURGERY  2013   meningioma  . ENTEROSCOPY N/A 03/04/2018   Procedure: PUSH ENTEROSCOPY;  Surgeon: Otis Brace, MD;  [facility];  Service: Gastroenterology;  Laterality: N/A;  . SHOULDER SURGERY      Family History:  Family History  Problem Relation Age of Onset  . Cancer Mother   . Stroke Father   . Heart disease Father     Social History:  reports that she has quit smoking. She has quit using smokeless tobacco. She reports previous alcohol use. She reports that she does not use drugs.  Additional Social History:  Alcohol / Drug Use Pain Medications: please see mar Prescriptions: please see mar Over the Counter: please see mar History of alcohol / drug use?: No history of alcohol / drug abuse Longest period of sobriety (when/how long): NA  CIWA: CIWA-Ar BP: (!) 142/81 Pulse  Rate: (!) 59 COWS:    Allergies: No Known Allergies  Home Medications: (Not in a hospital admission)   OB/GYN Status:  No LMP recorded. Patient has had a hysterectomy.  General Assessment Data [facility] TTS Assessment: In system Is this a Tele or Face-to-Face Assessment?: Tele Assessment Is this an Initial Assessment or a Re-assessment for this encounter?: Initial Assessment Patient Accompanied by:: N/A Language Other than English: No Living Arrangements: Other (Comment) What gender do you identify as?: Female Marital status: Widowed [place] name: NA Pregnancy Status: No Living Arrangements: Other (Comment) Can pt return to current living arrangement?: Yes Admission Status: Voluntary Is patient capable of signing voluntary admission?: Yes Referral Source: Self/Family/Friend Insurance type: [place] Living Arrangements: Other (Comment) Legal Guardian: Other:(Shannon Engineer, maintenance (IT)) Name of Psychiatrist: NA Name of Therapist: NA  Education Status Is patient currently in school?: No Is the patient employed, unemployed or receiving disability?: Unemployed  Risk to self with the past 6 months Suicidal Ideation: No Has patient been a risk to self within the past 6 months prior to admission? : No Suicidal Intent: No Has patient had any suicidal intent within the past 6 months prior to admission? : No Is patient at risk for suicide?: No Suicidal Plan?: No Has patient had any suicidal plan within the past 6 months prior to admission? : No Access to  Means: No What has been your use of drugs/alcohol within the last 12 months?: NA Previous Attempts/Gestures: No How many times?: 0 Other Self Harm Risks: NA Triggers for Past Attempts: None known Intentional Self Injurious Behavior: None Family Suicide History: No Recent stressful life event(s): Other (Comment) Persecutory voices/beliefs?: No Depression: No Depression Symptoms: (Pt  denies) Substance abuse history and/or treatment for substance abuse?: No Suicide prevention information given to non-admitted patients: Not applicable  Risk to Others within the past 6 months Homicidal Ideation: No Does patient have any lifetime risk of violence toward others beyond the six months prior to admission? : No Thoughts of Harm to Others: No Current Homicidal Intent: No Current Homicidal Plan: No Access to Homicidal Means: No Describe Access to Homicidal Means: NA Identified Victim: NA History of harm to others?: No Assessment of Violence: None Noted Violent Behavior Description: NA Does patient have access to weapons?: No Criminal Charges Pending?: No Does patient have a court date: No Is patient on probation?: Unknown  Psychosis Hallucinations: None noted Delusions: None noted  Mental Status Report Appearance/Hygiene: Unremarkable Eye Contact: Fair Motor Activity: Freedom of movement Speech: Logical/coherent Level of Consciousness: Alert Mood: Euthymic Affect: Appropriate to circumstance Anxiety Level: None Thought Processes: Coherent, Relevant Judgement: Impaired Orientation: Person, Place, Time, Situation Obsessive Compulsive Thoughts/Behaviors: None  Cognitive Functioning Concentration: Normal Memory: Recent Intact, Remote Intact Is patient IDD: No Insight: Poor Impulse Control: Poor Appetite: Fair Have you had any weight changes? : No Change Sleep: No Change Total Hours of Sleep: 8 Vegetative Symptoms: None  ADLScreening [facility] Assessment Services) Patient's cognitive ability adequate to safely complete daily activities?: No Patient able to express need for assistance with ADLs?: Yes Independently performs ADLs?: No  Prior Inpatient Therapy Prior Inpatient Therapy: Yes Prior Therapy Dates: 2019 Prior Therapy Facilty/Provider(s): unknown Reason for Treatment: dementia  Prior Outpatient Therapy Prior Outpatient Therapy: No Does patient  have an ACCT team?: No Does patient have Intensive In-House Services?  : No Does patient have [facility] services? : No Does patient have P4CC services?: No  ADL Screening (condition at time of admission) Patient's cognitive ability adequate to safely complete daily activities?: No Is the patient deaf or have difficulty hearing?: No Does the patient have difficulty seeing, even when wearing glasses/contacts?: Yes Does the patient have difficulty concentrating, remembering, or making decisions?: Yes Patient able to express need for assistance with ADLs?: Yes Does the patient have difficulty dressing or bathing?: Yes Independently performs ADLs?: No Communication: Needs assistance Dressing (OT): Needs assistance Grooming: Needs assistance Feeding: Needs assistance Bathing: Needs assistance Toileting: Needs assistance In/Out Bed: Needs assistance Walks in Home: Needs assistance       Abuse/Neglect Assessment (Assessment to be complete while patient is alone) Abuse/Neglect Assessment Can Be Completed: Yes Physical Abuse: Denies Verbal Abuse: Denies Sexual Abuse: Denies Exploitation of patient/patient's resources: Denies     Regulatory affairs officer (For Healthcare) Does Patient Have a Medical Advance Directive?: No Does patient want to make changes to medical advance directive?: No - Patient declined Would patient like information on creating a medical advance directive?: No - Patient declined          Disposition:  Disposition Initial Assessment Completed for this Encounter: Yes  This service was provided via telemedicine using a 2-way, interactive audio and video technology.  Names of all persons participating in this telemedicine service and their role in this encounter. Name: [name] Role: daughter-POA  Name:  Role:   Name: Role  Name:  Role:  Daelan Gatt D 11/21/2018 1:07 PM

## 2018-11-27 ENCOUNTER — Other Ambulatory Visit: Payer: Self-pay

## 2018-11-27 ENCOUNTER — Encounter (HOSPITAL_COMMUNITY): Payer: Self-pay | Admitting: Family Medicine

## 2018-11-27 ENCOUNTER — Emergency Department (HOSPITAL_COMMUNITY): Payer: Medicare PPO

## 2018-11-27 ENCOUNTER — Emergency Department (HOSPITAL_COMMUNITY)
Admission: EM | Admit: 2018-11-27 | Discharge: 2018-11-27 | Disposition: A | Discharge status: Skilled Nursing Facility | Payer: Medicare PPO | Attending: Emergency Medicine | Admitting: Emergency Medicine

## 2018-11-27 DIAGNOSIS — Z87891 Personal history of nicotine dependence: Secondary | ICD-10-CM | POA: Insufficient documentation

## 2018-11-27 DIAGNOSIS — I129 Hypertensive chronic kidney disease with stage 1 through stage 4 chronic kidney disease, or unspecified chronic kidney disease: Secondary | ICD-10-CM | POA: Insufficient documentation

## 2018-11-27 DIAGNOSIS — R0600 Dyspnea, unspecified: Secondary | ICD-10-CM | POA: Insufficient documentation

## 2018-11-27 DIAGNOSIS — Z20828 Contact with and (suspected) exposure to other viral communicable diseases: Secondary | ICD-10-CM | POA: Insufficient documentation

## 2018-11-27 DIAGNOSIS — M2550 Pain in unspecified joint: Secondary | ICD-10-CM | POA: Diagnosis not present

## 2018-11-27 DIAGNOSIS — N189 Chronic kidney disease, unspecified: Secondary | ICD-10-CM | POA: Insufficient documentation

## 2018-11-27 DIAGNOSIS — Z79899 Other long term (current) drug therapy: Secondary | ICD-10-CM | POA: Diagnosis not present

## 2018-11-27 DIAGNOSIS — Z7401 Bed confinement status: Secondary | ICD-10-CM | POA: Diagnosis not present

## 2018-11-27 DIAGNOSIS — Z7982 Long term (current) use of aspirin: Secondary | ICD-10-CM | POA: Insufficient documentation

## 2018-11-27 DIAGNOSIS — R0602 Shortness of breath: Secondary | ICD-10-CM | POA: Diagnosis not present

## 2018-11-27 DIAGNOSIS — I10 Essential (primary) hypertension: Secondary | ICD-10-CM | POA: Diagnosis not present

## 2018-11-27 DIAGNOSIS — Z85820 Personal history of malignant melanoma of skin: Secondary | ICD-10-CM | POA: Insufficient documentation

## 2018-11-27 LAB — BASIC METABOLIC PANEL
Anion gap: 10 (ref 5–15)
BUN: 15 mg/dL (ref 8–23)
CO2: 25 mmol/L (ref 22–32)
Calcium: 9 mg/dL (ref 8.9–10.3)
Chloride: 106 mmol/L (ref 98–111)
Creatinine, Ser: 1.26 mg/dL — ABNORMAL HIGH (ref 0.44–1.00)
GFR calc Af Amer: 49 mL/min — ABNORMAL LOW (ref >60)
GFR calc non Af Amer: 42 mL/min — ABNORMAL LOW (ref >60)
Glucose, Bld: 89 mg/dL (ref 70–99)
Potassium: 3.5 mmol/L (ref 3.5–5.1)
Sodium: 141 mmol/L (ref 135–145)

## 2018-11-27 LAB — CBC WITH DIFFERENTIAL/PLATELET
Abs Immature Granulocytes: 0.04 10*3/uL (ref 0.00–0.07)
Basophils Absolute: 0.1 10*3/uL (ref 0.0–0.1)
Basophils Relative: 1 %
Eosinophils Absolute: 0.5 10*3/uL (ref 0.0–0.5)
Eosinophils Relative: 5 %
HCT: 51 % — ABNORMAL HIGH (ref 36.0–46.0)
Hemoglobin: 16.1 g/dL — ABNORMAL HIGH (ref 12.0–15.0)
Immature Granulocytes: 0 %
Lymphocytes Relative: 22 %
Lymphs Abs: 2.1 10*3/uL (ref 0.7–4.0)
MCH: 29.4 pg (ref 26.0–34.0)
MCHC: 31.6 g/dL (ref 30.0–36.0)
MCV: 93.1 fL (ref 80.0–100.0)
Monocytes Absolute: 1 10*3/uL (ref 0.1–1.0)
Monocytes Relative: 10 %
Neutro Abs: 6.1 10*3/uL (ref 1.7–7.7)
Neutrophils Relative %: 62 %
Platelets: 267 10*3/uL (ref 150–400)
RBC: 5.48 MIL/uL — ABNORMAL HIGH (ref 3.87–5.11)
RDW: 14.8 % (ref 11.5–15.5)
WBC: 9.8 10*3/uL (ref 4.0–10.5)
nRBC: 0 % (ref 0.0–0.2)

## 2018-11-27 LAB — SARS CORONAVIRUS 2 (TAT 6-24 HRS): SARS Coronavirus 2: NEGATIVE

## 2018-11-27 NOTE — ED Triage Notes (Signed)
Patient is from [facility] and complains of shortness of breath all night. Patient appears in no acute distress. Skin is warm and dry. Respirations are even, regular, and unlabored.

## 2018-11-27 NOTE — ED Provider Notes (Signed)
[place] DEPT Provider Note   CSN: [id number] Arrival date & time: 11/27/18  0459     History   Chief Complaint Chief Complaint  Patient presents with  . Shortness of Breath    HPI Jamie George is a 73 y.o. female.     HPI   73 year old female with dyspnea.  Patient has some dementia and history is not the greatest.  She reports that she began feeling short of breath yesterday though.  Has been persistent since then.  Denies any cough.  No acute pain.  No fevers.  Of note, patient was seen in the emergency room for a similar complaint on 11/21/2018.  Past Medical History:  Diagnosis Date  . Cancer ([place])    skin  . Complication of anesthesia    " difficult to wake me up "  . Depression   . Duodenitis   . GERD (gastroesophageal reflux disease)   . Insomnia   . Migraines   . Mild dementia [facility])    Patient Active Problem List   Diagnosis Date Noted  . Dementia ([place]) 01/02/2018  . Aggression 01/02/2018  . Dissection of abdominal aorta ([place]) 08/13/2017  . Chest pain 08/13/2017  . Hypertension 08/13/2017  . CKD (chronic kidney disease) 08/13/2017  . Anemia 08/13/2017   Past Surgical History:  Procedure Laterality Date  . ABDOMINAL HYSTERECTOMY    . BACK SURGERY    . BIOPSY  03/04/2018   Procedure: BIOPSY;  Surgeon: Otis Brace, MD;  [facility];  Service: Gastroenterology;;  . BRAIN SURGERY  2013   meningioma  . ENTEROSCOPY N/A 03/04/2018   Procedure: PUSH ENTEROSCOPY;  Surgeon: Otis Brace, MD;  [facility];  Service: Gastroenterology;  Laterality: N/A;  . SHOULDER SURGERY      OB History   No obstetric history on file.    Home Medications    Prior to Admission medications   Medication Sig Start Date End Date Taking? Authorizing Provider  acetaminophen (TYLENOL 8 HOUR) 650 MG CR tablet Take 1 tablet (650 mg total) by mouth every 8 (eight) hours as needed for pain. Patient taking  differently: Take 650 mg by mouth every 6 (six) hours as needed for pain.  03/31/18   Petrucelli, Glynda Jaeger, PA-C  aspirin EC 81 MG EC tablet Take 1 tablet (81 mg total) by mouth daily. With food 08/15/17   Roxan Hockey, MD  atorvastatin (LIPITOR) 40 MG tablet Take 40 mg by mouth 2 (two) times a week. Sundays and Thursdays    [provider]  donepezil (ARICEPT) 10 MG tablet Take 10 mg by mouth at bedtime.    [provider]  escitalopram (LEXAPRO) 20 MG tablet Take 20 mg by mouth daily.  05/03/17   [provider]  ferrous sulfate 325 (65 FE) MG tablet Take 325 mg by mouth 2 (two) times daily with a meal.    [provider]  fluticasone (FLONASE) 50 MCG/ACT nasal spray Place 1 spray into both nostrils daily.    [provider]  HYDROcodone-acetaminophen (NORCO/VICODIN) 5-325 MG tablet Take 1 tablet by mouth every 6 (six) hours as needed for severe pain. Patient not taking: Reported on 10/19/2018 02/23/18   Blanchie Dessert, MD  Melatonin-Theanine (MELATONIN PLUS L-THEANINE) 10-5.5 MG TABS Take 1 tablet by mouth at bedtime.    [provider]  memantine (NAMENDA) 10 MG tablet Take 1 tablet (10 mg total) by mouth 2 (two) times daily. 12/17/17   Penumalli, Bonnita Levan  R, MD  mirtazapine (REMERON) 7.5 MG tablet Take 7.5 mg by mouth at bedtime.    [provider]  ondansetron (ZOFRAN) 4 MG tablet Take 1 tablet (4 mg total) by mouth every 6 (six) hours as needed for nausea. Patient not taking: Reported on 10/19/2018 08/14/17   Roxan Hockey, MD  pantoprazole (PROTONIX) 40 MG tablet Take 1 tablet (40 mg total) by mouth 2 (two) times daily before a meal. 08/14/17 11/21/18  Roxan Hockey, MD  polyethylene glycol (MIRALAX / GLYCOLAX) packet Take 17 g by mouth daily.    [provider]  risperiDONE (RISPERDAL) 0.5 MG tablet Take 0.5 mg by mouth at bedtime.    [provider]  traZODone (DESYREL) 50 MG tablet Take 50 mg by mouth at  bedtime.  05/03/17   [provider]  verapamil (CALAN-SR) 120 MG CR tablet Take 120 mg by mouth daily.    [provider]  zonisamide (ZONEGRAN) 100 MG capsule Take 100 mg by mouth at bedtime.     [provider]    Family History Family History  Problem Relation Age of Onset  . Cancer Mother   . Stroke Father   . Heart disease Father    Social History Social History   Tobacco Use  . Smoking status: Former Research scientist (life sciences)  . Smokeless tobacco: Former Network engineer Use Topics  . Alcohol use: Not Currently  . Drug use: Never     Allergies   Patient has no known allergies.  Review of Systems Review of Systems  All systems reviewed and negative, other than as noted in HPI.  Physical Exam Updated Vital Signs BP (!) 156/74   Pulse 88   Temp 98.3 F (36.8 C) (Oral)   Resp 20   SpO2 100%   Physical Exam Vitals signs and nursing note reviewed.  Constitutional:      General: She is not in acute distress.    Appearance: She is well-developed.  HENT:     Head: Normocephalic and atraumatic.  Eyes:     General:        Right eye: No discharge.        Left eye: No discharge.     Conjunctiva/sclera: Conjunctivae normal.  Neck:     Musculoskeletal: Neck supple.  Cardiovascular:     Rate and Rhythm: Normal rate and regular rhythm.     Heart sounds: Normal heart sounds. No murmur. No friction rub. No gallop.   Pulmonary:     Effort: Pulmonary effort is normal. No respiratory distress.     Breath sounds: Normal breath sounds.  Abdominal:     General: There is no distension.     Palpations: Abdomen is soft.     Tenderness: There is no abdominal tenderness.  Musculoskeletal:        General: No tenderness.     Right lower leg: No edema.     Left lower leg: No edema.     Comments: Lower extremities symmetric as compared to each other. No calf tenderness. Negative Homan's. No palpable cords.   Skin:    General: Skin is warm and dry.  Neurological:      Mental Status: She is alert.  Psychiatric:        Behavior: Behavior normal.        Thought Content: Thought content normal.      ED Treatments / Results  Labs (all labs ordered are listed, but only abnormal results are displayed) Labs Reviewed  CBC  WITH DIFFERENTIAL/PLATELET - Abnormal; Notable for the following components:      Result Value   RBC 5.48 (*)    Hemoglobin 16.1 (*)    HCT 51.0 (*)    All other components within normal limits  BASIC METABOLIC PANEL - Abnormal; Notable for the following components:   Creatinine, Ser 1.26 (*)    GFR calc non Af Amer 42 (*)    GFR calc Af Amer 49 (*)    All other components within normal limits  SARS CORONAVIRUS 2 (TAT 6-24 HRS)    EKG EKG Interpretation  Date/Time:  Wednesday November 27 2018 07:31:30 EDT Ventricular Rate:  56 PR Interval:    QRS Duration: 88 QT Interval:  486 QTC Calculation: 470 R Axis:   73 Text Interpretation:  Sinus rhythm Borderline T abnormalities, inferior leads Baseline wander in lead(s) V4 Confirmed by Virgel Manifold [phone]) on 11/27/2018 8:48:45 AM   Radiology Dg Chest 2 View  Result Date: 11/27/2018 CLINICAL DATA:  Shortness of breath EXAM: CHEST - 2 VIEW COMPARISON:  Six days ago FINDINGS: Normal heart size and mediastinal contours. There is no edema, consolidation, effusion, or pneumothorax. Subtle nodular densities over the bilateral mid chest which correlates with reticulonodular density on 08/13/2017 chest CT when mild bronchiectasis was also demonstrated. IMPRESSION: 1. No acute finding. 2. Chronic lung disease with pulmonary nodules as characterized by CT in 2019. Electronically Signed   By: Monte Fantasia M.D.   On: 11/27/2018 06:27    Procedures Procedures (including critical care time)  Medications Ordered in ED Medications - No data to display   Initial Impression / Assessment and Plan / ED Course  I have reviewed the triage vital signs and the nursing notes.  Pertinent labs &  imaging results that were available during my care of the patient were reviewed by me and considered in my medical decision making (see chart for details).       73 year old female with dyspnea of unclear etiology.  Afebrile.  Generally well-appearing.  Oxygen saturations are normal on room air.  She sounds clear on exam.  Chest x-ray today without acute abnormality.  Basic labs unremarkable.  Of note, she is seen the emergency room approximately 1 week ago.  She had a negative work-up at that time including an age-adjusted d-dimer.  I have a very low suspicion for PE or other emergent process.  Final Clinical Impressions(s) / ED Diagnoses   Final diagnoses:  Dyspnea, unspecified type    ED Discharge Orders    None       Virgel Manifold, MD 11/27/18 [phone]

## 2018-11-27 NOTE — ED Notes (Signed)
Assisted patient to the restroom and back to stretcher.

## 2018-12-11 DIAGNOSIS — N390 Urinary tract infection, site not specified: Secondary | ICD-10-CM | POA: Diagnosis not present

## 2018-12-13 DIAGNOSIS — R0602 Shortness of breath: Secondary | ICD-10-CM | POA: Diagnosis not present

## 2018-12-17 DIAGNOSIS — Z20828 Contact with and (suspected) exposure to other viral communicable diseases: Secondary | ICD-10-CM | POA: Diagnosis not present

## 2018-12-18 ENCOUNTER — Telehealth: Payer: Self-pay | Admitting: *Deleted

## 2018-12-18 NOTE — Telephone Encounter (Signed)
A message was left, re: her follow up visit. 

## 2018-12-24 ENCOUNTER — Telehealth: Payer: Self-pay

## 2018-12-25 DIAGNOSIS — Z20828 Contact with and (suspected) exposure to other viral communicable diseases: Secondary | ICD-10-CM | POA: Diagnosis not present

## 2018-12-26 ENCOUNTER — Telehealth: Payer: Self-pay | Admitting: *Deleted

## 2018-12-26 NOTE — Telephone Encounter (Signed)
We have left several message, re: her follow up visit.

## 2019-01-03 DIAGNOSIS — Z20828 Contact with and (suspected) exposure to other viral communicable diseases: Secondary | ICD-10-CM | POA: Diagnosis not present

## 2019-01-07 ENCOUNTER — Telehealth: Payer: Self-pay | Admitting: *Deleted

## 2019-01-07 NOTE — Telephone Encounter (Signed)
We have left Jamie George several messages,re: her follow up visit with no response.Recall removed.

## 2019-01-15 DIAGNOSIS — Z20828 Contact with and (suspected) exposure to other viral communicable diseases: Secondary | ICD-10-CM | POA: Diagnosis not present

## 2019-01-22 DIAGNOSIS — Z20828 Contact with and (suspected) exposure to other viral communicable diseases: Secondary | ICD-10-CM | POA: Diagnosis not present

## 2019-01-24 DIAGNOSIS — M545 Low back pain: Secondary | ICD-10-CM | POA: Diagnosis not present

## 2019-01-24 DIAGNOSIS — M25552 Pain in left hip: Secondary | ICD-10-CM | POA: Diagnosis not present

## 2019-01-25 DIAGNOSIS — M25552 Pain in left hip: Secondary | ICD-10-CM | POA: Diagnosis not present

## 2019-01-25 DIAGNOSIS — M545 Low back pain: Secondary | ICD-10-CM | POA: Diagnosis not present

## 2019-01-29 DIAGNOSIS — Z20828 Contact with and (suspected) exposure to other viral communicable diseases: Secondary | ICD-10-CM | POA: Diagnosis not present

## 2019-02-03 ENCOUNTER — Emergency Department (HOSPITAL_COMMUNITY)
Admission: EM | Admit: 2019-02-03 | Discharge: 2019-02-03 | Disposition: A | Discharge status: Home / Self Care | Payer: Medicare PPO | Attending: Emergency Medicine | Admitting: Emergency Medicine

## 2019-02-03 ENCOUNTER — Emergency Department (HOSPITAL_COMMUNITY): Payer: Medicare PPO

## 2019-02-03 ENCOUNTER — Encounter (HOSPITAL_COMMUNITY): Payer: Self-pay | Admitting: Emergency Medicine

## 2019-02-03 DIAGNOSIS — M25551 Pain in right hip: Secondary | ICD-10-CM | POA: Insufficient documentation

## 2019-02-03 DIAGNOSIS — Z87891 Personal history of nicotine dependence: Secondary | ICD-10-CM | POA: Diagnosis not present

## 2019-02-03 DIAGNOSIS — R279 Unspecified lack of coordination: Secondary | ICD-10-CM | POA: Diagnosis not present

## 2019-02-03 DIAGNOSIS — R0902 Hypoxemia: Secondary | ICD-10-CM | POA: Diagnosis not present

## 2019-02-03 DIAGNOSIS — R52 Pain, unspecified: Secondary | ICD-10-CM | POA: Diagnosis not present

## 2019-02-03 DIAGNOSIS — R404 Transient alteration of awareness: Secondary | ICD-10-CM | POA: Diagnosis not present

## 2019-02-03 DIAGNOSIS — Z743 Need for continuous supervision: Secondary | ICD-10-CM | POA: Diagnosis not present

## 2019-02-03 DIAGNOSIS — M25561 Pain in right knee: Secondary | ICD-10-CM | POA: Diagnosis not present

## 2019-02-03 DIAGNOSIS — M25572 Pain in left ankle and joints of left foot: Secondary | ICD-10-CM | POA: Diagnosis not present

## 2019-02-03 DIAGNOSIS — Z7982 Long term (current) use of aspirin: Secondary | ICD-10-CM | POA: Diagnosis not present

## 2019-02-03 DIAGNOSIS — F0390 Unspecified dementia without behavioral disturbance: Secondary | ICD-10-CM | POA: Diagnosis not present

## 2019-02-03 DIAGNOSIS — Z79899 Other long term (current) drug therapy: Secondary | ICD-10-CM | POA: Insufficient documentation

## 2019-02-03 DIAGNOSIS — I10 Essential (primary) hypertension: Secondary | ICD-10-CM | POA: Diagnosis not present

## 2019-02-03 DIAGNOSIS — M5489 Other dorsalgia: Secondary | ICD-10-CM | POA: Diagnosis not present

## 2019-02-03 DIAGNOSIS — I129 Hypertensive chronic kidney disease with stage 1 through stage 4 chronic kidney disease, or unspecified chronic kidney disease: Secondary | ICD-10-CM | POA: Diagnosis not present

## 2019-02-03 DIAGNOSIS — N189 Chronic kidney disease, unspecified: Secondary | ICD-10-CM | POA: Diagnosis not present

## 2019-02-03 DIAGNOSIS — M545 Low back pain: Secondary | ICD-10-CM | POA: Diagnosis not present

## 2019-02-03 MED ORDER — HYDROCODONE-ACETAMINOPHEN 5-325 MG PO TABS: 1.0000 | ORAL_TABLET | Freq: Four times a day (QID) | ORAL | 0 refills | Status: DC | PRN

## 2019-02-03 MED ORDER — HYDROCODONE-ACETAMINOPHEN 5-325 MG PO TABS
2.0000 | ORAL_TABLET | Freq: Once | ORAL | Status: AC
  Administered 2019-02-03: 08:00:00 2 via ORAL
  Filled 2019-02-03: qty 2

## 2019-02-03 NOTE — Discharge Instructions (Addendum)
Follow-up with your doctor next week for recheck 

## 2019-02-03 NOTE — ED Notes (Signed)
[facility] called  

## 2019-02-03 NOTE — ED Provider Notes (Signed)
[place] DEPT Provider Note   CSN: [id number] Arrival date & time: 02/03/19  [id number]     History Chief Complaint  Patient presents with  . Hip Pain   Level 5 caveat due to dementia Jamie George is a 73 y.o. female.  The history is provided by the patient.  Hip Pain This is a recurrent problem. The problem occurs constantly. The problem has been gradually worsening. The symptoms are aggravated by walking. Nothing relieves the symptoms.  Patient reports right lower back pain that radiates to the right hip and knee.  No recent falls.  She reports she normally can walk.  She has no other complaints.  Denies abdominal pain.  No fevers are reported     Past Medical History:  Diagnosis Date  . Cancer ([place])    skin  . Complication of anesthesia    " difficult to wake me up "  . Depression   . Duodenitis   . GERD (gastroesophageal reflux disease)   . Insomnia   . Migraines   . Mild dementia [facility])     Patient Active Problem List   Diagnosis Date Noted  . Dementia ([place]) 01/02/2018  . Aggression 01/02/2018  . Dissection of abdominal aorta ([place]) 08/13/2017  . Chest pain 08/13/2017  . Hypertension 08/13/2017  . CKD (chronic kidney disease) 08/13/2017  . Anemia 08/13/2017    Past Surgical History:  Procedure Laterality Date  . ABDOMINAL HYSTERECTOMY    . BACK SURGERY    . BIOPSY  03/04/2018   Procedure: BIOPSY;  Surgeon: Otis Brace, MD;  [facility];  Service: Gastroenterology;;  . BRAIN SURGERY  2013   meningioma  . ENTEROSCOPY N/A 03/04/2018   Procedure: PUSH ENTEROSCOPY;  Surgeon: Otis Brace, MD;  [facility];  Service: Gastroenterology;  Laterality: N/A;  . SHOULDER SURGERY       OB History   No obstetric history on file.     Family History  Problem Relation Age of Onset  . Cancer Mother   . Stroke Father   . Heart disease Father     Social History   Tobacco Use  . Smoking status:  Former Research scientist (life sciences)  . Smokeless tobacco: Former Network engineer Use Topics  . Alcohol use: Not Currently  . Drug use: Never    Home Medications Prior to Admission medications   Medication Sig Start Date End Date Taking? Authorizing Provider  acetaminophen (TYLENOL 8 HOUR) 650 MG CR tablet Take 1 tablet (650 mg total) by mouth every 8 (eight) hours as needed for pain. Patient taking differently: Take 650 mg by mouth every 6 (six) hours as needed for pain.  03/31/18   Petrucelli, Glynda Jaeger, PA-C  aspirin EC 81 MG EC tablet Take 1 tablet (81 mg total) by mouth daily. With food 08/15/17   Roxan Hockey, MD  atorvastatin (LIPITOR) 40 MG tablet Take 40 mg by mouth 2 (two) times a week. Sundays and Thursdays    [provider]  donepezil (ARICEPT) 10 MG tablet Take 10 mg by mouth at bedtime.    [provider]  escitalopram (LEXAPRO) 20 MG tablet Take 20 mg by mouth daily.  05/03/17   [provider]  ferrous sulfate 325 (65 FE) MG tablet Take 325 mg by mouth 2 (two) times daily with a meal.    [provider]  fluticasone (FLONASE) 50 MCG/ACT nasal spray Place 1 spray into both nostrils daily.    [provider]  HYDROcodone-acetaminophen (NORCO/VICODIN) 5-325 MG tablet Take 1 tablet by mouth every 6 (six) hours as needed for severe pain. Patient not taking: Reported on 10/19/2018 02/23/18   Blanchie Dessert, MD  Melatonin-Theanine (MELATONIN PLUS L-THEANINE) 10-5.5 MG TABS Take 1 tablet by mouth at bedtime.    [provider]  memantine (NAMENDA) 10 MG tablet Take 1 tablet (10 mg total) by mouth 2 (two) times daily. 12/17/17   Penumalli, Earlean Polka, MD  mirtazapine (REMERON) 7.5 MG tablet Take 7.5 mg by mouth at bedtime.    [provider]  ondansetron (ZOFRAN) 4 MG tablet Take 1 tablet (4 mg total) by mouth every 6 (six) hours as needed for nausea. Patient not taking: Reported on 10/19/2018 08/14/17   Roxan Hockey, MD  pantoprazole  (PROTONIX) 40 MG tablet Take 1 tablet (40 mg total) by mouth 2 (two) times daily before a meal. 08/14/17 11/21/18  Roxan Hockey, MD  polyethylene glycol (MIRALAX / GLYCOLAX) packet Take 17 g by mouth daily.    [provider]  risperiDONE (RISPERDAL) 0.5 MG tablet Take 0.5 mg by mouth at bedtime.    [provider]  traZODone (DESYREL) 50 MG tablet Take 50 mg by mouth at bedtime.  05/03/17   [provider]  verapamil (CALAN-SR) 120 MG CR tablet Take 120 mg by mouth daily.    [provider]  zonisamide (ZONEGRAN) 100 MG capsule Take 100 mg by mouth at bedtime.     [provider]    Allergies    Patient has no known allergies.  Review of Systems   Review of Systems  Unable to perform ROS: Dementia    Physical Exam Updated Vital Signs   Physical Exam CONSTITUTIONAL: Elderly, no distress, working on a crossword puzzle HEAD: Normocephalic/atraumatic EYES: EOMI ENMT: Mucous membranes moist NECK: supple no meningeal signs SPINE/BACK:entire spine nontender, right lumbar paraspinal tenderness, no erythema or bruising CV: S1/S2 noted, no murmurs/rubs/gallops noted LUNGS: Lungs are clear to auscultation bilaterally, no apparent distress ABDOMEN: soft, nontender NEURO: Pt is awake/alert/appropriate, moves all extremitiesx4.  No facial droop.  She has equal strength noted to bilateral lower extremities.  Extrapyramidal symptoms noted EXTREMITIES: pulses normal/equal, full ROM, tenderness to range of motion of right hip, tenderness to palpation of right knee.  No deformities.  Distal pulses intact SKIN: warm, color normal PSYCH: mildly anxious  ED Results / Procedures / Treatments   Labs (all labs ordered are listed, but only abnormal results are displayed) Labs Reviewed - No data to display  EKG None  Radiology No results found.  Procedures Procedures    Medications Ordered in ED Medications  HYDROcodone-acetaminophen  (NORCO/VICODIN) 5-325 MG per tablet 2 tablet (has no administration in time range)    ED Course  I have reviewed the triage vital signs and the nursing notes.  Pertinent labs & imaging results that were available during my care of the patient were reviewed by me and considered in my medical decision making (see chart for details).    MDM Rules/Calculators/A&P     Patient presents from nursing facility after reporting back and right hip pain.  She is tender in paraspinal region as well as her right hip and right knee.  No signs of deformities. no Signs of trauma  I discussed the case with the [place] and they report there is no falls reported Patient is usually ambulatory                 7:07  AM Signed out to dr zammit at shift change to f/u on imaging and ensure patient can ambulate Final Clinical Impression(s) / ED Diagnoses Final diagnoses:  None    Rx / DC Orders ED Discharge Orders    None       Ripley Fraise, MD 02/03/19 [phone]

## 2019-02-03 NOTE — ED Triage Notes (Signed)
Patient here from [facility] with complaints of right hip pain. Denies fall, no trauma. States that she "woke up with pain".

## 2019-02-03 NOTE — ED Notes (Signed)
[facility] notified and call was sent to nurse voicemail line. Informed nurse that pt would be returning.

## 2019-02-05 DIAGNOSIS — Z20828 Contact with and (suspected) exposure to other viral communicable diseases: Secondary | ICD-10-CM | POA: Diagnosis not present

## 2019-02-07 DIAGNOSIS — F5101 Primary insomnia: Secondary | ICD-10-CM | POA: Diagnosis not present

## 2019-02-07 DIAGNOSIS — M25551 Pain in right hip: Secondary | ICD-10-CM | POA: Diagnosis not present

## 2019-02-12 DIAGNOSIS — Z1159 Encounter for screening for other viral diseases: Secondary | ICD-10-CM | POA: Diagnosis not present

## 2019-02-19 DIAGNOSIS — Z1159 Encounter for screening for other viral diseases: Secondary | ICD-10-CM | POA: Diagnosis not present

## 2019-02-27 DIAGNOSIS — Z20822 Contact with and (suspected) exposure to covid-19: Secondary | ICD-10-CM | POA: Diagnosis not present

## 2019-03-05 DIAGNOSIS — R488 Other symbolic dysfunctions: Secondary | ICD-10-CM | POA: Diagnosis not present

## 2019-03-07 DIAGNOSIS — R488 Other symbolic dysfunctions: Secondary | ICD-10-CM | POA: Diagnosis not present

## 2019-03-10 DIAGNOSIS — R488 Other symbolic dysfunctions: Secondary | ICD-10-CM | POA: Diagnosis not present

## 2019-03-13 DIAGNOSIS — R488 Other symbolic dysfunctions: Secondary | ICD-10-CM | POA: Diagnosis not present

## 2019-03-14 DIAGNOSIS — R488 Other symbolic dysfunctions: Secondary | ICD-10-CM | POA: Diagnosis not present

## 2019-03-18 DIAGNOSIS — R488 Other symbolic dysfunctions: Secondary | ICD-10-CM | POA: Diagnosis not present

## 2019-03-19 DIAGNOSIS — Z20822 Contact with and (suspected) exposure to covid-19: Secondary | ICD-10-CM | POA: Diagnosis not present

## 2019-03-20 DIAGNOSIS — R488 Other symbolic dysfunctions: Secondary | ICD-10-CM | POA: Diagnosis not present

## 2019-03-24 ENCOUNTER — Emergency Department (HOSPITAL_COMMUNITY)
Admission: EM | Admit: 2019-03-24 | Discharge: 2019-03-24 | Disposition: A | Discharge status: Home / Self Care | Payer: Medicare PPO | Attending: Emergency Medicine | Admitting: Emergency Medicine

## 2019-03-24 ENCOUNTER — Encounter (HOSPITAL_COMMUNITY): Payer: Self-pay | Admitting: Emergency Medicine

## 2019-03-24 ENCOUNTER — Emergency Department (HOSPITAL_COMMUNITY): Payer: Medicare PPO

## 2019-03-24 DIAGNOSIS — Y939 Activity, unspecified: Secondary | ICD-10-CM | POA: Insufficient documentation

## 2019-03-24 DIAGNOSIS — R0789 Other chest pain: Secondary | ICD-10-CM | POA: Diagnosis not present

## 2019-03-24 DIAGNOSIS — M25531 Pain in right wrist: Secondary | ICD-10-CM | POA: Insufficient documentation

## 2019-03-24 DIAGNOSIS — G4489 Other headache syndrome: Secondary | ICD-10-CM | POA: Diagnosis not present

## 2019-03-24 DIAGNOSIS — Y999 Unspecified external cause status: Secondary | ICD-10-CM | POA: Insufficient documentation

## 2019-03-24 DIAGNOSIS — R410 Disorientation, unspecified: Secondary | ICD-10-CM | POA: Diagnosis not present

## 2019-03-24 DIAGNOSIS — Z87891 Personal history of nicotine dependence: Secondary | ICD-10-CM | POA: Insufficient documentation

## 2019-03-24 DIAGNOSIS — Y92019 Unspecified place in single-family (private) house as the place of occurrence of the external cause: Secondary | ICD-10-CM | POA: Insufficient documentation

## 2019-03-24 DIAGNOSIS — S0093XA Contusion of unspecified part of head, initial encounter: Secondary | ICD-10-CM | POA: Diagnosis not present

## 2019-03-24 DIAGNOSIS — F0390 Unspecified dementia without behavioral disturbance: Secondary | ICD-10-CM | POA: Insufficient documentation

## 2019-03-24 DIAGNOSIS — Z23 Encounter for immunization: Secondary | ICD-10-CM | POA: Insufficient documentation

## 2019-03-24 DIAGNOSIS — W010XXA Fall on same level from slipping, tripping and stumbling without subsequent striking against object, initial encounter: Secondary | ICD-10-CM | POA: Insufficient documentation

## 2019-03-24 DIAGNOSIS — R079 Chest pain, unspecified: Secondary | ICD-10-CM | POA: Diagnosis not present

## 2019-03-24 DIAGNOSIS — S0083XA Contusion of other part of head, initial encounter: Secondary | ICD-10-CM

## 2019-03-24 DIAGNOSIS — S6991XA Unspecified injury of right wrist, hand and finger(s), initial encounter: Secondary | ICD-10-CM | POA: Diagnosis not present

## 2019-03-24 DIAGNOSIS — R404 Transient alteration of awareness: Secondary | ICD-10-CM | POA: Diagnosis not present

## 2019-03-24 DIAGNOSIS — S199XXA Unspecified injury of neck, initial encounter: Secondary | ICD-10-CM | POA: Diagnosis not present

## 2019-03-24 DIAGNOSIS — S01111A Laceration without foreign body of right eyelid and periocular area, initial encounter: Secondary | ICD-10-CM | POA: Diagnosis not present

## 2019-03-24 DIAGNOSIS — S0990XA Unspecified injury of head, initial encounter: Secondary | ICD-10-CM | POA: Diagnosis not present

## 2019-03-24 DIAGNOSIS — W19XXXA Unspecified fall, initial encounter: Secondary | ICD-10-CM

## 2019-03-24 MED ORDER — IBUPROFEN 200 MG PO TABS
600.0000 mg | ORAL_TABLET | Freq: Once | ORAL | Status: AC
  Administered 2019-03-24: 16:00:00 600 mg via ORAL
  Filled 2019-03-24: qty 3

## 2019-03-24 MED ORDER — ACETAMINOPHEN 500 MG PO TABS
1000.0000 mg | ORAL_TABLET | Freq: Once | ORAL | Status: AC
  Administered 2019-03-24: 15:00:00 1000 mg via ORAL
  Filled 2019-03-24: qty 2

## 2019-03-24 MED ORDER — TETANUS-DIPHTH-ACELL PERTUSSIS 5-2.5-18.5 LF-MCG/0.5 IM SUSP
0.5000 mL | Freq: Once | INTRAMUSCULAR | Status: AC
  Administered 2019-03-24: 0.5 mL via INTRAMUSCULAR
  Filled 2019-03-24: qty 0.5

## 2019-03-24 NOTE — ED Notes (Signed)
Pt refuses to keep on thumb spica splint. Pt states " I do not want this, I dont need it." Splint will be given to pts daughter upon discharge.

## 2019-03-24 NOTE — ED Provider Notes (Signed)
[facility] Emergency Department Provider Note MRN:  [id number]  Arrival date & time: 03/24/19     Chief Complaint   Fall   History of Present Illness   Jamie George is a 74 y.o. year-old female with a history of dementia presenting to the ED with chief complaint of fall.  Patient explains that she was standing from seated position and tripped over her own feet and fell forward, striking her face on the ground.  Denies loss of consciousness, denies blood thinners, no nausea or vomiting, mild neck pain, no back pain, no chest pain or shortness of breath.  Pain to the right side of the face and right wrist.  Pain is constant, moderate, worse with motion.  Review of Systems  A complete 10 system review of systems was obtained and all systems are negative except as noted in the HPI and PMH.   Patient's Health History    Past Medical History:  Diagnosis Date  . Cancer ([place])    skin  . Complication of anesthesia    " difficult to wake me up "  . Depression   . Duodenitis   . GERD (gastroesophageal reflux disease)   . Insomnia   . Migraines   . Mild dementia [facility])     Past Surgical History:  Procedure Laterality Date  . ABDOMINAL HYSTERECTOMY    . BACK SURGERY    . BIOPSY  03/04/2018   Procedure: BIOPSY;  Surgeon: Otis Brace, MD;  Location: [facility];  Service: Gastroenterology;;  . BRAIN SURGERY  2013   meningioma  . ENTEROSCOPY N/A 03/04/2018   Procedure: PUSH ENTEROSCOPY;  Surgeon: Otis Brace, MD;  Location: [facility];  Service: Gastroenterology;  Laterality: N/A;  . SHOULDER SURGERY      Family History  Problem Relation Age of Onset  . Cancer Mother   . Stroke Father   . Heart disease Father     Social History   Socioeconomic History  . Marital status: Widowed    Spouse name: Not on file  . Number of children: 2  . Years of education: 37  . Highest education level: Not on file  Occupational History    Comment:  retired  Tobacco Use  . Smoking status: Former Research scientist (life sciences)  . Smokeless tobacco: Former Network engineer and Sexual Activity  . Alcohol use: Not Currently  . Drug use: Never  . Sexual activity: Not Currently  Other Topics Concern  . Not on file  Social History Narrative   12/17/17 Lives alone in home.  Retired.  Widowed.  Children 2 (daughter, [name]).     Caffeine some.    Social Determinants of Health   Financial Resource Strain:   . Difficulty of Paying Living Expenses: Not on file  Food Insecurity:   . Worried About Charity fundraiser in the Last Year: Not on file  . Ran Out of Food in the Last Year: Not on file  Transportation Needs:   . Lack of Transportation (Medical): Not on file  . Lack of Transportation (Non-Medical): Not on file  Physical Activity:   . Days of Exercise per Week: Not on file  . Minutes of Exercise per Session: Not on file  Stress:   . Feeling of Stress : Not on file  Social Connections:   . Frequency of Communication with Friends and Family: Not on file  . Frequency of Social Gatherings with Friends and Family: Not on file  . Attends Religious Services:  Not on file  . Active Member of Clubs or Organizations: Not on file  . Attends Archivist Meetings: Not on file  . Marital Status: Not on file  Intimate Partner Violence:   . Fear of Current or Ex-Partner: Not on file  . Emotionally Abused: Not on file  . Physically Abused: Not on file  . Sexually Abused: Not on file     Physical Exam   Vitals:   03/24/19 1321 03/24/19 1605  BP: (!) 157/94 (!) 178/83  Pulse: 79 64  Resp: 16 16  Temp: 97.9 F (36.6 C)   SpO2: 93% 95%    CONSTITUTIONAL: Well-appearing, NAD NEURO:  Alert and oriented x 3, no focal deficits, mildly confused EYES:  eyes equal and reactive ENT/NECK:  no LAD, no JVD CARDIO: Regular rate, well-perfused, normal S1 and S2 PULM:  CTAB no wheezing or rhonchi GI/GU:  normal bowel sounds, non-distended,  non-tender MSK/SPINE:  No gross deformities, no edema, right snuffbox tenderness SKIN: Bruising and abrasion beneath the right eye, abrasion to the right wrist PSYCH:  Appropriate speech and behavior  *Additional and/or pertinent findings included in MDM below  Diagnostic and Interventional Summary    EKG Interpretation  Date/Time:    Ventricular Rate:    PR Interval:    QRS Duration:   QT Interval:    QTC Calculation:   R Axis:     Text Interpretation:        Cardiac Monitoring Interpretation:  Labs Reviewed - No data to display  CT MAXILLOFACIAL WO CONTRAST  Final Result    CT CERVICAL SPINE WO CONTRAST  Final Result    CT HEAD WO CONTRAST  Final Result    DG Wrist Complete Right  Final Result      Medications  ibuprofen (ADVIL) tablet 600 mg (has no administration in time range)  acetaminophen (TYLENOL) tablet 1,000 mg (1,000 mg Oral Given 03/24/19 1438)  Tdap (BOOSTRIX) injection 0.5 mL (0.5 mLs Intramuscular Given 03/24/19 1439)     Procedures  /  Critical Care Procedures  ED Course and Medical Decision Making  I have reviewed the triage vital signs, the nursing notes, and pertinent available records from the EMR.  Pertinent labs & imaging results that were available during my care of the patient were reviewed by me and considered in my medical decision making (see below for details).     Mechanical fall, will obtain CT imaging of the head, face, C-spine to exclude significant injury.  Patient has snuffbox tenderness to the right wrist, will x-ray, will need splint.  CT and x-ray imaging are unremarkable, appropriate for discharge.  Barth Kirks. [place] Small, [place] [email or web address]  Final Clinical Impressions(s) / ED Diagnoses     ICD-10-CM   1. Fall, initial encounter  W19.XXXA   2. Contusion of face, initial encounter  S00.83XA   3. Right wrist pain  M25.531     ED Discharge Orders    None        Discharge Instructions Discussed with and Provided to Patient:     Discharge Instructions     You were evaluated in the Emergency Department and after careful evaluation, we did not find any emergent condition requiring admission or further testing in the hospital.  Your exam/testing today was overall reassuring.  As discussed, please follow-up with orthopedic surgery within the next 2 weeks for repeat evaluation or x-ray of the right wrist.  Keep the splint on  as often as possible.  Please return to the Emergency Department if you experience any worsening of your condition.  We encourage you to follow up with a primary care provider.  Thank you for allowing [place] to be a part of your care.        Maudie Flakes, MD 03/24/19 [phone]

## 2019-03-24 NOTE — ED Notes (Signed)
Pt transported to CT ?

## 2019-03-24 NOTE — ED Notes (Signed)
Ortho called 

## 2019-03-24 NOTE — Discharge Instructions (Addendum)
You were evaluated in the Emergency Department and after careful evaluation, we did not find any emergent condition requiring admission or further testing in the hospital.  Your exam/testing today was overall reassuring.  As discussed, please follow-up with orthopedic surgery within the next 2 weeks for repeat evaluation or x-ray of the right wrist.  Keep the splint on as often as possible.  Please return to the Emergency Department if you experience any worsening of your condition.  We encourage you to follow up with a primary care provider.  Thank you for allowing [place] to be a part of your care.

## 2019-03-24 NOTE — ED Notes (Signed)
Pts daughter contacted for transport home

## 2019-03-24 NOTE — ED Triage Notes (Signed)
Patient here from home with complaints of fall today. States that she "tripped and fell". Lac noted to right eye. Bleeding controlled. Hx of dementia.

## 2019-03-24 NOTE — ED Notes (Signed)
Pt returned from CT and requesting to use the restroom.  Pt is a high fall risk and is here due to injuries from a fall. Purewick placed on pt and explained to pt that she could urinate and the purewick would collect the urine.  Pt is also requesting water.  I advised pt that we needed to wait for the results of her scans.

## 2019-03-25 DIAGNOSIS — R488 Other symbolic dysfunctions: Secondary | ICD-10-CM | POA: Diagnosis not present

## 2019-03-26 DIAGNOSIS — R488 Other symbolic dysfunctions: Secondary | ICD-10-CM | POA: Diagnosis not present

## 2019-03-27 DIAGNOSIS — Z20822 Contact with and (suspected) exposure to covid-19: Secondary | ICD-10-CM | POA: Diagnosis not present

## 2019-03-27 DIAGNOSIS — R488 Other symbolic dysfunctions: Secondary | ICD-10-CM | POA: Diagnosis not present

## 2019-03-29 DIAGNOSIS — M25531 Pain in right wrist: Secondary | ICD-10-CM | POA: Diagnosis not present

## 2019-03-29 DIAGNOSIS — M79641 Pain in right hand: Secondary | ICD-10-CM | POA: Diagnosis not present

## 2019-03-31 DIAGNOSIS — Z20822 Contact with and (suspected) exposure to covid-19: Secondary | ICD-10-CM | POA: Diagnosis not present

## 2019-04-01 DIAGNOSIS — R488 Other symbolic dysfunctions: Secondary | ICD-10-CM | POA: Diagnosis not present

## 2019-04-02 ENCOUNTER — Emergency Department (HOSPITAL_COMMUNITY): Payer: Medicare PPO

## 2019-04-02 ENCOUNTER — Emergency Department (HOSPITAL_COMMUNITY)
Admission: EM | Admit: 2019-04-02 | Discharge: 2019-04-03 | Disposition: A | Discharge status: Home / Self Care | Payer: Medicare PPO | Attending: Emergency Medicine | Admitting: Emergency Medicine

## 2019-04-02 ENCOUNTER — Other Ambulatory Visit: Payer: Self-pay

## 2019-04-02 DIAGNOSIS — S0993XA Unspecified injury of face, initial encounter: Secondary | ICD-10-CM | POA: Diagnosis present

## 2019-04-02 DIAGNOSIS — Y939 Activity, unspecified: Secondary | ICD-10-CM | POA: Insufficient documentation

## 2019-04-02 DIAGNOSIS — Z87891 Personal history of nicotine dependence: Secondary | ICD-10-CM | POA: Diagnosis not present

## 2019-04-02 DIAGNOSIS — W0110XA Fall on same level from slipping, tripping and stumbling with subsequent striking against unspecified object, initial encounter: Secondary | ICD-10-CM | POA: Insufficient documentation

## 2019-04-02 DIAGNOSIS — I129 Hypertensive chronic kidney disease with stage 1 through stage 4 chronic kidney disease, or unspecified chronic kidney disease: Secondary | ICD-10-CM | POA: Insufficient documentation

## 2019-04-02 DIAGNOSIS — G4489 Other headache syndrome: Secondary | ICD-10-CM | POA: Diagnosis not present

## 2019-04-02 DIAGNOSIS — I10 Essential (primary) hypertension: Secondary | ICD-10-CM | POA: Diagnosis not present

## 2019-04-02 DIAGNOSIS — T148XXA Other injury of unspecified body region, initial encounter: Secondary | ICD-10-CM

## 2019-04-02 DIAGNOSIS — S299XXA Unspecified injury of thorax, initial encounter: Secondary | ICD-10-CM | POA: Diagnosis not present

## 2019-04-02 DIAGNOSIS — S199XXA Unspecified injury of neck, initial encounter: Secondary | ICD-10-CM | POA: Diagnosis not present

## 2019-04-02 DIAGNOSIS — S0990XA Unspecified injury of head, initial encounter: Secondary | ICD-10-CM | POA: Diagnosis not present

## 2019-04-02 DIAGNOSIS — Z7982 Long term (current) use of aspirin: Secondary | ICD-10-CM | POA: Diagnosis not present

## 2019-04-02 DIAGNOSIS — W19XXXA Unspecified fall, initial encounter: Secondary | ICD-10-CM | POA: Diagnosis not present

## 2019-04-02 DIAGNOSIS — N189 Chronic kidney disease, unspecified: Secondary | ICD-10-CM | POA: Insufficient documentation

## 2019-04-02 DIAGNOSIS — Y92129 Unspecified place in nursing home as the place of occurrence of the external cause: Secondary | ICD-10-CM | POA: Insufficient documentation

## 2019-04-02 DIAGNOSIS — F0390 Unspecified dementia without behavioral disturbance: Secondary | ICD-10-CM | POA: Insufficient documentation

## 2019-04-02 DIAGNOSIS — Z79899 Other long term (current) drug therapy: Secondary | ICD-10-CM | POA: Diagnosis not present

## 2019-04-02 DIAGNOSIS — S0081XA Abrasion of other part of head, initial encounter: Secondary | ICD-10-CM | POA: Insufficient documentation

## 2019-04-02 DIAGNOSIS — Y999 Unspecified external cause status: Secondary | ICD-10-CM | POA: Diagnosis not present

## 2019-04-02 DIAGNOSIS — R52 Pain, unspecified: Secondary | ICD-10-CM | POA: Diagnosis not present

## 2019-04-02 DIAGNOSIS — Z85828 Personal history of other malignant neoplasm of skin: Secondary | ICD-10-CM | POA: Diagnosis not present

## 2019-04-02 DIAGNOSIS — R58 Hemorrhage, not elsewhere classified: Secondary | ICD-10-CM | POA: Diagnosis not present

## 2019-04-02 LAB — URINALYSIS, ROUTINE W REFLEX MICROSCOPIC
Bilirubin Urine: NEGATIVE
Glucose, UA: NEGATIVE mg/dL
Hgb urine dipstick: NEGATIVE
Ketones, ur: NEGATIVE mg/dL
Leukocytes,Ua: NEGATIVE
Nitrite: NEGATIVE
Protein, ur: NEGATIVE mg/dL
Specific Gravity, Urine: 1.023 (ref 1.005–1.030)
pH: 6 (ref 5.0–8.0)

## 2019-04-02 LAB — CBC WITH DIFFERENTIAL/PLATELET
Abs Immature Granulocytes: 0.12 10*3/uL — ABNORMAL HIGH (ref 0.00–0.07)
Basophils Absolute: 0.2 10*3/uL — ABNORMAL HIGH (ref 0.0–0.1)
Basophils Relative: 1 %
Eosinophils Absolute: 0.4 10*3/uL (ref 0.0–0.5)
Eosinophils Relative: 3 %
HCT: 52.6 % — ABNORMAL HIGH (ref 36.0–46.0)
Hemoglobin: 16.3 g/dL — ABNORMAL HIGH (ref 12.0–15.0)
Immature Granulocytes: 1 %
Lymphocytes Relative: 10 %
Lymphs Abs: 1.4 10*3/uL (ref 0.7–4.0)
MCH: 30 pg (ref 26.0–34.0)
MCHC: 31 g/dL (ref 30.0–36.0)
MCV: 96.7 fL (ref 80.0–100.0)
Monocytes Absolute: 1.6 10*3/uL — ABNORMAL HIGH (ref 0.1–1.0)
Monocytes Relative: 12 %
Neutro Abs: 9.9 10*3/uL — ABNORMAL HIGH (ref 1.7–7.7)
Neutrophils Relative %: 73 %
Platelets: 316 10*3/uL (ref 150–400)
RBC: 5.44 MIL/uL — ABNORMAL HIGH (ref 3.87–5.11)
RDW: 15.1 % (ref 11.5–15.5)
WBC: 13.6 10*3/uL — ABNORMAL HIGH (ref 4.0–10.5)
nRBC: 0 % (ref 0.0–0.2)

## 2019-04-02 LAB — BASIC METABOLIC PANEL
Anion gap: 11 (ref 5–15)
BUN: 16 mg/dL (ref 8–23)
CO2: 26 mmol/L (ref 22–32)
Calcium: 8.8 mg/dL — ABNORMAL LOW (ref 8.9–10.3)
Chloride: 105 mmol/L (ref 98–111)
Creatinine, Ser: 1.3 mg/dL — ABNORMAL HIGH (ref 0.44–1.00)
GFR calc Af Amer: 46 mL/min — ABNORMAL LOW (ref >60)
GFR calc non Af Amer: 40 mL/min — ABNORMAL LOW (ref >60)
Glucose, Bld: 110 mg/dL — ABNORMAL HIGH (ref 70–99)
Potassium: 3.9 mmol/L (ref 3.5–5.1)
Sodium: 142 mmol/L (ref 135–145)

## 2019-04-02 MED ORDER — ACETAMINOPHEN 325 MG PO TABS
650.0000 mg | ORAL_TABLET | Freq: Once | ORAL | Status: AC
  Administered 2019-04-02: 650 mg via ORAL
  Filled 2019-04-02: qty 2

## 2019-04-02 NOTE — Discharge Instructions (Addendum)
Recommend follow-up with primary doctor regarding fall today.  Return to ER if you develop new concerning symptoms such as chest pain, difficulty breathing.

## 2019-04-02 NOTE — ED Notes (Signed)
[facility] called for transport.  

## 2019-04-02 NOTE — ED Notes (Signed)
Pt transported to xray 

## 2019-04-02 NOTE — ED Triage Notes (Signed)
Patient arrived from ALF from unwitnessed fall. Patient stated that she didn't lose consciousness. She also stated that she wasn't on blood thinners. Abrasion to right cheek and right upper lip. She stated that she had an ear sore for about a month and that is her main complaint. She also c/o HA which she has daily HAs.

## 2019-04-02 NOTE — ED Provider Notes (Signed)
[facility] DEPT Provider Note   CSN: [id number] Arrival date & time: 04/02/19  1938     History Chief Complaint  Patient presents with  . Fall    Jamie George is a 74 y.o. female.  Presents to the emergency room with chief complaint fall.  Patient lives at assisted living facility.  Right before she was brought [facility], accidentally tripped, landed on her face.  Has had pain on her right cheek.  Not any blood thinners, no loss of consciousness.  No associated chest pain, difficulty breathing, abdominal pain.  Has been able to ambulate since fall.  HPI     Past Medical History:  Diagnosis Date  . Cancer ([place])    skin  . Complication of anesthesia    " difficult to wake me up "  . Depression   . Duodenitis   . GERD (gastroesophageal reflux disease)   . Insomnia   . Migraines   . Mild dementia [facility])     Patient Active Problem List   Diagnosis Date Noted  . Dementia ([place]) 01/02/2018  . Aggression 01/02/2018  . Dissection of abdominal aorta ([place]) 08/13/2017  . Chest pain 08/13/2017  . Hypertension 08/13/2017  . CKD (chronic kidney disease) 08/13/2017  . Anemia 08/13/2017    Past Surgical History:  Procedure Laterality Date  . ABDOMINAL HYSTERECTOMY    . BACK SURGERY    . BIOPSY  03/04/2018   Procedure: BIOPSY;  Surgeon: Otis Brace, MD;  [facility]: [facility];  Service: Gastroenterology;;  . BRAIN SURGERY  2013   meningioma  . ENTEROSCOPY N/A 03/04/2018   Procedure: PUSH ENTEROSCOPY;  Surgeon: Otis Brace, MD;  Location: [facility];  Service: Gastroenterology;  Laterality: N/A;  . SHOULDER SURGERY       OB History   No obstetric history on file.     Family History  Problem Relation Age of Onset  . Cancer Mother   . Stroke Father   . Heart disease Father     Social History   Tobacco Use  . Smoking status: Former Research scientist (life sciences)  . Smokeless tobacco: Former Network engineer Use Topics  . Alcohol use:  Not Currently  . Drug use: Never    Home Medications Prior to Admission medications   Medication Sig Start Date End Date Taking? Authorizing Provider  acetaminophen (TYLENOL) 325 MG tablet Take 650 mg by mouth every 8 (eight) hours as needed for mild pain.   Yes [provider]  acetaminophen (TYLENOL) 325 MG tablet Take 650 mg by mouth every 6 (six) hours as needed for moderate pain.   Yes [provider]  aspirin EC 81 MG EC tablet Take 1 tablet (81 mg total) by mouth daily. With food 08/15/17  Yes Roxan Hockey, MD  aspirin-acetaminophen-caffeine (EXCEDRIN MIGRAINE) [phone] MG tablet Take 1 tablet by mouth every 6 (six) hours as needed for headache.   Yes [provider]  atorvastatin (LIPITOR) 40 MG tablet Take 40 mg by mouth 2 (two) times a week. Sundays and Thursdays   Yes [provider]  donepezil (ARICEPT) 10 MG tablet Take 10 mg by mouth at bedtime.   Yes [provider]  escitalopram (LEXAPRO) 20 MG tablet Take 20 mg by mouth daily.  05/03/17  Yes [provider]  ferrous sulfate 325 (65 FE) MG tablet Take 325 mg by mouth 2 (two) times daily with a meal.   Yes [provider]  fluticasone (FLONASE) 50 MCG/ACT nasal spray  Place 1 spray into both nostrils daily.   Yes [provider]  HYDROcodone-acetaminophen (NORCO/VICODIN) 5-325 MG tablet Take 1 tablet by mouth every 6 (six) hours as needed for moderate pain. 02/03/19  Yes Milton Ferguson, MD  Melatonin-Theanine (MELATONIN PLUS L-THEANINE) 10-5.5 MG TABS Take 1 tablet by mouth at bedtime.   Yes [provider]  memantine (NAMENDA) 10 MG tablet Take 1 tablet (10 mg total) by mouth 2 (two) times daily. 12/17/17  Yes Penumalli, Earlean Polka, MD  mirtazapine (REMERON) 7.5 MG tablet Take 7.5 mg by mouth at bedtime.   Yes [provider]  pantoprazole (PROTONIX) 40 MG tablet Take 40 mg by mouth 2 (two) times daily.   Yes [provider]    polyethylene glycol (MIRALAX / GLYCOLAX) packet Take 17 g by mouth daily.   Yes [provider]  risperiDONE (RISPERDAL) 0.5 MG tablet Take 1 mg by mouth at bedtime.    Yes [provider]  traZODone (DESYREL) 50 MG tablet Take 50 mg by mouth at bedtime.  05/03/17  Yes [provider]  verapamil (CALAN-SR) 120 MG CR tablet Take 120 mg by mouth daily.   Yes [provider]  zonisamide (ZONEGRAN) 100 MG capsule Take 100 mg by mouth at bedtime.    Yes [provider]  acetaminophen (TYLENOL 8 HOUR) 650 MG CR tablet Take 1 tablet (650 mg total) by mouth every 8 (eight) hours as needed for pain. Patient not taking: Reported on 02/03/2019 03/31/18   Petrucelli, Aldona Bar R, PA-C  ondansetron (ZOFRAN) 4 MG tablet Take 1 tablet (4 mg total) by mouth every 6 (six) hours as needed for nausea. Patient not taking: Reported on 02/03/2019 08/14/17   Roxan Hockey, MD  pantoprazole (PROTONIX) 40 MG tablet Take 1 tablet (40 mg total) by mouth 2 (two) times daily before a meal. 08/14/17 03/24/19  Roxan Hockey, MD    Allergies    Patient has no known allergies.  Review of Systems   Review of Systems  Constitutional: Negative for chills and fever.  HENT: Negative for ear pain and sore throat.   Eyes: Negative for pain and visual disturbance.  Respiratory: Negative for cough and shortness of breath.   Cardiovascular: Negative for chest pain and palpitations.  Gastrointestinal: Negative for abdominal pain and vomiting.  Genitourinary: Negative for dysuria and hematuria.  Musculoskeletal: Negative for arthralgias and back pain.  Skin: Negative for color change and rash.  Neurological: Negative for seizures and syncope.  All other systems reviewed and are negative.   Physical Exam Updated Vital Signs BP (!) 166/79   Pulse 79   Temp 98.3 F (36.8 C) (Oral)   Resp 18   Ht 5\' 7"  (1.702 m)   Wt 67.1 kg   SpO2 93%   BMI 23.18 kg/m   Physical Exam Vitals  and nursing note reviewed.  Constitutional:      General: She is not in acute distress.    Appearance: She is well-developed.  HENT:     Head: Normocephalic.     Comments: Some tenderness over right cheek, superficial abrasion    Mouth/Throat:     Comments: Mild dried blood right upper and lower lip, no laceration noted throughout buccal mucosa, tongue Eyes:     Extraocular Movements: Extraocular movements intact.     Conjunctiva/sclera: Conjunctivae normal.     Pupils: Pupils are equal, round, and reactive to light.     Comments: No hyphema, normal EOM  Cardiovascular:  Rate and Rhythm: Normal rate and regular rhythm.     Heart sounds: No murmur.  Pulmonary:     Effort: Pulmonary effort is normal. No respiratory distress.     Breath sounds: Normal breath sounds.  Abdominal:     Palpations: Abdomen is soft.     Tenderness: There is no abdominal tenderness.  Musculoskeletal:     Cervical back: Neck supple.     Comments: No tenderness to palpation over C, T, L-spine No tenderness to palpation throughout all 4 extremities  Skin:    General: Skin is warm and dry.     Capillary Refill: Capillary refill takes less than 2 seconds.  Neurological:     General: No focal deficit present.     Mental Status: She is alert and oriented to person, place, and time.  Psychiatric:        Mood and Affect: Mood normal.        Behavior: Behavior normal.     ED Results / Procedures / Treatments   Labs (all labs ordered are listed, but only abnormal results are displayed) Labs Reviewed  CBC WITH DIFFERENTIAL/PLATELET - Abnormal; Notable for the following components:      Result Value   WBC 13.6 (*)    RBC 5.44 (*)    Hemoglobin 16.3 (*)    HCT 52.6 (*)    Neutro Abs 9.9 (*)    Monocytes Absolute 1.6 (*)    Basophils Absolute 0.2 (*)    Abs Immature Granulocytes 0.12 (*)    All other components within normal limits  BASIC METABOLIC PANEL  URINALYSIS, ROUTINE W REFLEX MICROSCOPIC     EKG EKG Interpretation  Date/Time:  Wednesday April 02 2019 21:07:10 EST Ventricular Rate:  92 PR Interval:    QRS Duration: 65 QT Interval:  422 QTC Calculation: 523 R Axis:   77 Text Interpretation: Sinus rhythm Borderline T abnormalities, anterior leads Prolonged QT interval Confirmed by Madalyn Rob [phone]) on 04/02/2019 9:50:03 PM   Radiology DG Chest 2 View  Result Date: 04/02/2019 CLINICAL DATA:  Unwitnessed fall. EXAM: CHEST - 2 VIEW COMPARISON:  11/27/2018 FINDINGS: The cardiomediastinal silhouette is unchanged with normal heart size. There is mild elevation of the right hemidiaphragm. Subtle nodular densities are again noted in the mid lungs bilaterally, also present on a 08/13/2017 chest CT which demonstrated mild bronchiectasis as well. There is mild scarring or subsegmental atelectasis in the right midlung. No acute consolidative airspace opacity, edema, pleural effusion, or pneumothorax is identified. No acute osseous abnormality is seen. IMPRESSION: Chronic lung findings without evidence of acute cardiopulmonary process. Electronically Signed   By: Logan Bores M.D.   On: 04/02/2019 21:03   CT Head Wo Contrast  Result Date: 04/02/2019 CLINICAL DATA:  [place], facial abrasions EXAM: CT HEAD WITHOUT CONTRAST TECHNIQUE: Contiguous axial images were obtained from the base of the skull through the vertex without intravenous contrast. COMPARISON:  03/24/2019 FINDINGS: Brain: Chronic ischemic changes are seen within the right basal ganglia and periventricular white matter. No acute infarct or hemorrhage. Lateral ventricles and remaining midline structures are unremarkable. No acute extra-axial fluid collections. No mass effect. Vascular: No hyperdense vessel or unexpected calcification. Skull: Stable postsurgical changes from prior left frontal craniotomy. No acute fractures. Sinuses/Orbits: No acute finding. Other: None IMPRESSION: 1. Stable head CT, no acute process.  Electronically Signed   By: Randa Ngo M.D.   On: 04/02/2019 21:14   CT Cervical Spine Wo Contrast  Result Date: 04/02/2019 CLINICAL DATA:  Un witnessed fall, facial abrasion EXAM: CT CERVICAL SPINE WITHOUT CONTRAST TECHNIQUE: Multidetector CT imaging of the cervical spine was performed without intravenous contrast. Multiplanar CT image reconstructions were also generated. COMPARISON:  03/24/2019 FINDINGS: Alignment: Stable loss of lordosis with mild kyphosis centered at C4. This may be in part due to congenital fusion of C2 and C3, as well as multilevel spondylosis. Skull base and vertebrae: No acute displaced fractures. Soft tissues and spinal canal: No prevertebral fluid or swelling. No visible canal hematoma. Disc levels: Stable multilevel spondylosis most pronounced at C3/C4, C4/C5, C5/C6. Upper chest: Lung apices are clear.  Central airways patent. Other: Reconstructed images demonstrate no additional findings. IMPRESSION: 1. Stable multilevel spondylosis.  No acute fracture. Electronically Signed   By: Randa Ngo M.D.   On: 04/02/2019 21:12   CT Maxillofacial Wo Contrast  Result Date: 04/02/2019 CLINICAL DATA:  Un witnessed fall, facial abrasions EXAM: CT MAXILLOFACIAL WITHOUT CONTRAST TECHNIQUE: Multidetector CT imaging of the maxillofacial structures was performed. Multiplanar CT image reconstructions were also generated. COMPARISON:  03/24/2019 FINDINGS: Osseous: No acute displaced fractures. Orbits: Negative. No traumatic or inflammatory finding. Sinuses: Clear. Soft tissues: Negative. Limited intracranial: No significant or unexpected finding. IMPRESSION: 1. No acute facial bone fractures. Electronically Signed   By: Randa Ngo M.D.   On: 04/02/2019 21:16    Procedures Procedures (including critical care time)  Medications Ordered in ED Medications  acetaminophen (TYLENOL) tablet 650 mg (650 mg Oral Given 04/02/19 2111)    ED Course  I have reviewed the triage vital signs  and the nursing notes.  Pertinent labs & imaging results that were available during my care of the patient were reviewed by me and considered in my medical decision making (see chart for details).    MDM Rules/Calculators/A&P                      74 year old lady who presented to ER with complaints of fall, right cheek pain.  On exam noted some tenderness over her right cheekbone, small dried blood in mouth but no identifiable laceration.  Otherwise negative exam.  CTs negative.  CXR negative.  Basic labs within normal limits.  Will discharge back to assisted living facility.    After the discussed management above, the patient was determined to be safe for discharge.  The patient was in agreement with this plan and all questions regarding their care were answered.  ED return precautions were discussed and the patient will return to the ED with any significant worsening of condition.   Final Clinical Impression(s) / ED Diagnoses Final diagnoses:  Fall, initial encounter  Abrasion    Rx / DC Orders ED Discharge Orders    None       Lucrezia Starch, MD 04/03/19 [phone]

## 2019-04-03 DIAGNOSIS — R296 Repeated falls: Secondary | ICD-10-CM | POA: Diagnosis not present

## 2019-04-03 DIAGNOSIS — R2681 Unsteadiness on feet: Secondary | ICD-10-CM | POA: Diagnosis not present

## 2019-04-03 DIAGNOSIS — R488 Other symbolic dysfunctions: Secondary | ICD-10-CM | POA: Diagnosis not present

## 2019-04-03 DIAGNOSIS — R4182 Altered mental status, unspecified: Secondary | ICD-10-CM | POA: Diagnosis not present

## 2019-04-03 DIAGNOSIS — Z20822 Contact with and (suspected) exposure to covid-19: Secondary | ICD-10-CM | POA: Diagnosis not present

## 2019-04-03 DIAGNOSIS — Z743 Need for continuous supervision: Secondary | ICD-10-CM | POA: Diagnosis not present

## 2019-04-03 DIAGNOSIS — R279 Unspecified lack of coordination: Secondary | ICD-10-CM | POA: Diagnosis not present

## 2019-04-03 DIAGNOSIS — M6281 Muscle weakness (generalized): Secondary | ICD-10-CM | POA: Diagnosis not present

## 2019-04-04 DIAGNOSIS — M6281 Muscle weakness (generalized): Secondary | ICD-10-CM | POA: Diagnosis not present

## 2019-04-04 DIAGNOSIS — R488 Other symbolic dysfunctions: Secondary | ICD-10-CM | POA: Diagnosis not present

## 2019-04-04 DIAGNOSIS — R2681 Unsteadiness on feet: Secondary | ICD-10-CM | POA: Diagnosis not present

## 2019-04-04 DIAGNOSIS — R296 Repeated falls: Secondary | ICD-10-CM | POA: Diagnosis not present

## 2019-04-09 DIAGNOSIS — M6281 Muscle weakness (generalized): Secondary | ICD-10-CM | POA: Diagnosis not present

## 2019-04-09 DIAGNOSIS — R488 Other symbolic dysfunctions: Secondary | ICD-10-CM | POA: Diagnosis not present

## 2019-04-09 DIAGNOSIS — R296 Repeated falls: Secondary | ICD-10-CM | POA: Diagnosis not present

## 2019-04-09 DIAGNOSIS — R2681 Unsteadiness on feet: Secondary | ICD-10-CM | POA: Diagnosis not present

## 2019-04-11 DIAGNOSIS — R296 Repeated falls: Secondary | ICD-10-CM | POA: Diagnosis not present

## 2019-04-11 DIAGNOSIS — R488 Other symbolic dysfunctions: Secondary | ICD-10-CM | POA: Diagnosis not present

## 2019-04-11 DIAGNOSIS — R2681 Unsteadiness on feet: Secondary | ICD-10-CM | POA: Diagnosis not present

## 2019-04-11 DIAGNOSIS — M6281 Muscle weakness (generalized): Secondary | ICD-10-CM | POA: Diagnosis not present

## 2019-04-14 DIAGNOSIS — R296 Repeated falls: Secondary | ICD-10-CM | POA: Diagnosis not present

## 2019-04-14 DIAGNOSIS — R2681 Unsteadiness on feet: Secondary | ICD-10-CM | POA: Diagnosis not present

## 2019-04-14 DIAGNOSIS — M6281 Muscle weakness (generalized): Secondary | ICD-10-CM | POA: Diagnosis not present

## 2019-04-14 DIAGNOSIS — R488 Other symbolic dysfunctions: Secondary | ICD-10-CM | POA: Diagnosis not present

## 2019-04-15 DIAGNOSIS — R296 Repeated falls: Secondary | ICD-10-CM | POA: Diagnosis not present

## 2019-04-15 DIAGNOSIS — R488 Other symbolic dysfunctions: Secondary | ICD-10-CM | POA: Diagnosis not present

## 2019-04-15 DIAGNOSIS — R2681 Unsteadiness on feet: Secondary | ICD-10-CM | POA: Diagnosis not present

## 2019-04-15 DIAGNOSIS — M6281 Muscle weakness (generalized): Secondary | ICD-10-CM | POA: Diagnosis not present

## 2019-04-16 DIAGNOSIS — R2681 Unsteadiness on feet: Secondary | ICD-10-CM | POA: Diagnosis not present

## 2019-04-16 DIAGNOSIS — R488 Other symbolic dysfunctions: Secondary | ICD-10-CM | POA: Diagnosis not present

## 2019-04-16 DIAGNOSIS — M6281 Muscle weakness (generalized): Secondary | ICD-10-CM | POA: Diagnosis not present

## 2019-04-16 DIAGNOSIS — R296 Repeated falls: Secondary | ICD-10-CM | POA: Diagnosis not present

## 2019-04-17 DIAGNOSIS — Z20822 Contact with and (suspected) exposure to covid-19: Secondary | ICD-10-CM | POA: Diagnosis not present

## 2019-04-17 DIAGNOSIS — R2681 Unsteadiness on feet: Secondary | ICD-10-CM | POA: Diagnosis not present

## 2019-04-17 DIAGNOSIS — M6281 Muscle weakness (generalized): Secondary | ICD-10-CM | POA: Diagnosis not present

## 2019-04-17 DIAGNOSIS — R488 Other symbolic dysfunctions: Secondary | ICD-10-CM | POA: Diagnosis not present

## 2019-04-17 DIAGNOSIS — R296 Repeated falls: Secondary | ICD-10-CM | POA: Diagnosis not present

## 2019-04-21 DIAGNOSIS — M6281 Muscle weakness (generalized): Secondary | ICD-10-CM | POA: Diagnosis not present

## 2019-04-21 DIAGNOSIS — R488 Other symbolic dysfunctions: Secondary | ICD-10-CM | POA: Diagnosis not present

## 2019-04-21 DIAGNOSIS — R296 Repeated falls: Secondary | ICD-10-CM | POA: Diagnosis not present

## 2019-04-21 DIAGNOSIS — R2681 Unsteadiness on feet: Secondary | ICD-10-CM | POA: Diagnosis not present

## 2019-04-22 DIAGNOSIS — M6281 Muscle weakness (generalized): Secondary | ICD-10-CM | POA: Diagnosis not present

## 2019-04-22 DIAGNOSIS — R2681 Unsteadiness on feet: Secondary | ICD-10-CM | POA: Diagnosis not present

## 2019-04-22 DIAGNOSIS — R488 Other symbolic dysfunctions: Secondary | ICD-10-CM | POA: Diagnosis not present

## 2019-04-22 DIAGNOSIS — R296 Repeated falls: Secondary | ICD-10-CM | POA: Diagnosis not present

## 2019-04-23 DIAGNOSIS — R488 Other symbolic dysfunctions: Secondary | ICD-10-CM | POA: Diagnosis not present

## 2019-04-23 DIAGNOSIS — M6281 Muscle weakness (generalized): Secondary | ICD-10-CM | POA: Diagnosis not present

## 2019-04-23 DIAGNOSIS — R296 Repeated falls: Secondary | ICD-10-CM | POA: Diagnosis not present

## 2019-04-23 DIAGNOSIS — R2681 Unsteadiness on feet: Secondary | ICD-10-CM | POA: Diagnosis not present

## 2019-04-24 DIAGNOSIS — R488 Other symbolic dysfunctions: Secondary | ICD-10-CM | POA: Diagnosis not present

## 2019-04-24 DIAGNOSIS — R2681 Unsteadiness on feet: Secondary | ICD-10-CM | POA: Diagnosis not present

## 2019-04-24 DIAGNOSIS — M6281 Muscle weakness (generalized): Secondary | ICD-10-CM | POA: Diagnosis not present

## 2019-04-24 DIAGNOSIS — R296 Repeated falls: Secondary | ICD-10-CM | POA: Diagnosis not present

## 2019-04-25 DIAGNOSIS — R2681 Unsteadiness on feet: Secondary | ICD-10-CM | POA: Diagnosis not present

## 2019-04-25 DIAGNOSIS — R488 Other symbolic dysfunctions: Secondary | ICD-10-CM | POA: Diagnosis not present

## 2019-04-25 DIAGNOSIS — M6281 Muscle weakness (generalized): Secondary | ICD-10-CM | POA: Diagnosis not present

## 2019-04-25 DIAGNOSIS — R296 Repeated falls: Secondary | ICD-10-CM | POA: Diagnosis not present

## 2019-04-29 DIAGNOSIS — M6281 Muscle weakness (generalized): Secondary | ICD-10-CM | POA: Diagnosis not present

## 2019-04-29 DIAGNOSIS — R2681 Unsteadiness on feet: Secondary | ICD-10-CM | POA: Diagnosis not present

## 2019-04-29 DIAGNOSIS — R488 Other symbolic dysfunctions: Secondary | ICD-10-CM | POA: Diagnosis not present

## 2019-04-29 DIAGNOSIS — R296 Repeated falls: Secondary | ICD-10-CM | POA: Diagnosis not present

## 2019-04-30 DIAGNOSIS — M6281 Muscle weakness (generalized): Secondary | ICD-10-CM | POA: Diagnosis not present

## 2019-04-30 DIAGNOSIS — R296 Repeated falls: Secondary | ICD-10-CM | POA: Diagnosis not present

## 2019-04-30 DIAGNOSIS — R2681 Unsteadiness on feet: Secondary | ICD-10-CM | POA: Diagnosis not present

## 2019-04-30 DIAGNOSIS — R488 Other symbolic dysfunctions: Secondary | ICD-10-CM | POA: Diagnosis not present

## 2019-05-01 DIAGNOSIS — M6281 Muscle weakness (generalized): Secondary | ICD-10-CM | POA: Diagnosis not present

## 2019-05-01 DIAGNOSIS — R488 Other symbolic dysfunctions: Secondary | ICD-10-CM | POA: Diagnosis not present

## 2019-05-01 DIAGNOSIS — R2681 Unsteadiness on feet: Secondary | ICD-10-CM | POA: Diagnosis not present

## 2019-05-01 DIAGNOSIS — R296 Repeated falls: Secondary | ICD-10-CM | POA: Diagnosis not present

## 2019-05-02 DIAGNOSIS — R2681 Unsteadiness on feet: Secondary | ICD-10-CM | POA: Diagnosis not present

## 2019-05-02 DIAGNOSIS — M6281 Muscle weakness (generalized): Secondary | ICD-10-CM | POA: Diagnosis not present

## 2019-05-02 DIAGNOSIS — R488 Other symbolic dysfunctions: Secondary | ICD-10-CM | POA: Diagnosis not present

## 2019-05-02 DIAGNOSIS — R296 Repeated falls: Secondary | ICD-10-CM | POA: Diagnosis not present

## 2019-05-06 DIAGNOSIS — R2681 Unsteadiness on feet: Secondary | ICD-10-CM | POA: Diagnosis not present

## 2019-05-06 DIAGNOSIS — M6281 Muscle weakness (generalized): Secondary | ICD-10-CM | POA: Diagnosis not present

## 2019-05-06 DIAGNOSIS — R488 Other symbolic dysfunctions: Secondary | ICD-10-CM | POA: Diagnosis not present

## 2019-05-06 DIAGNOSIS — R296 Repeated falls: Secondary | ICD-10-CM | POA: Diagnosis not present

## 2019-05-07 DIAGNOSIS — R2681 Unsteadiness on feet: Secondary | ICD-10-CM | POA: Diagnosis not present

## 2019-05-07 DIAGNOSIS — M6281 Muscle weakness (generalized): Secondary | ICD-10-CM | POA: Diagnosis not present

## 2019-05-07 DIAGNOSIS — R296 Repeated falls: Secondary | ICD-10-CM | POA: Diagnosis not present

## 2019-05-07 DIAGNOSIS — R488 Other symbolic dysfunctions: Secondary | ICD-10-CM | POA: Diagnosis not present

## 2019-05-08 DIAGNOSIS — R488 Other symbolic dysfunctions: Secondary | ICD-10-CM | POA: Diagnosis not present

## 2019-05-08 DIAGNOSIS — R2681 Unsteadiness on feet: Secondary | ICD-10-CM | POA: Diagnosis not present

## 2019-05-08 DIAGNOSIS — R296 Repeated falls: Secondary | ICD-10-CM | POA: Diagnosis not present

## 2019-05-08 DIAGNOSIS — M6281 Muscle weakness (generalized): Secondary | ICD-10-CM | POA: Diagnosis not present

## 2019-05-09 DIAGNOSIS — R296 Repeated falls: Secondary | ICD-10-CM | POA: Diagnosis not present

## 2019-05-09 DIAGNOSIS — R488 Other symbolic dysfunctions: Secondary | ICD-10-CM | POA: Diagnosis not present

## 2019-05-09 DIAGNOSIS — M6281 Muscle weakness (generalized): Secondary | ICD-10-CM | POA: Diagnosis not present

## 2019-05-09 DIAGNOSIS — R2681 Unsteadiness on feet: Secondary | ICD-10-CM | POA: Diagnosis not present

## 2019-05-14 DIAGNOSIS — Z20822 Contact with and (suspected) exposure to covid-19: Secondary | ICD-10-CM | POA: Diagnosis not present

## 2019-05-14 DIAGNOSIS — R2681 Unsteadiness on feet: Secondary | ICD-10-CM | POA: Diagnosis not present

## 2019-05-14 DIAGNOSIS — R488 Other symbolic dysfunctions: Secondary | ICD-10-CM | POA: Diagnosis not present

## 2019-05-14 DIAGNOSIS — R296 Repeated falls: Secondary | ICD-10-CM | POA: Diagnosis not present

## 2019-05-14 DIAGNOSIS — M6281 Muscle weakness (generalized): Secondary | ICD-10-CM | POA: Diagnosis not present

## 2019-05-15 DIAGNOSIS — R296 Repeated falls: Secondary | ICD-10-CM | POA: Diagnosis not present

## 2019-05-15 DIAGNOSIS — R488 Other symbolic dysfunctions: Secondary | ICD-10-CM | POA: Diagnosis not present

## 2019-05-15 DIAGNOSIS — R2681 Unsteadiness on feet: Secondary | ICD-10-CM | POA: Diagnosis not present

## 2019-05-15 DIAGNOSIS — M6281 Muscle weakness (generalized): Secondary | ICD-10-CM | POA: Diagnosis not present

## 2019-05-16 DIAGNOSIS — R488 Other symbolic dysfunctions: Secondary | ICD-10-CM | POA: Diagnosis not present

## 2019-05-16 DIAGNOSIS — R2681 Unsteadiness on feet: Secondary | ICD-10-CM | POA: Diagnosis not present

## 2019-05-16 DIAGNOSIS — M6281 Muscle weakness (generalized): Secondary | ICD-10-CM | POA: Diagnosis not present

## 2019-05-16 DIAGNOSIS — R296 Repeated falls: Secondary | ICD-10-CM | POA: Diagnosis not present

## 2019-05-19 DIAGNOSIS — R488 Other symbolic dysfunctions: Secondary | ICD-10-CM | POA: Diagnosis not present

## 2019-05-19 DIAGNOSIS — R2681 Unsteadiness on feet: Secondary | ICD-10-CM | POA: Diagnosis not present

## 2019-05-19 DIAGNOSIS — R296 Repeated falls: Secondary | ICD-10-CM | POA: Diagnosis not present

## 2019-05-19 DIAGNOSIS — M6281 Muscle weakness (generalized): Secondary | ICD-10-CM | POA: Diagnosis not present

## 2019-05-20 ENCOUNTER — Ambulatory Visit (INDEPENDENT_AMBULATORY_CARE_PROVIDER_SITE_OTHER): Payer: Medicare PPO | Admitting: Diagnostic Neuroimaging

## 2019-05-20 ENCOUNTER — Other Ambulatory Visit: Payer: Self-pay

## 2019-05-20 ENCOUNTER — Encounter: Payer: Self-pay | Admitting: Diagnostic Neuroimaging

## 2019-05-20 VITALS — BP 129/80 | HR 89 | Temp 97.4°F | Ht 66.0 in | Wt 161.6 lb

## 2019-05-20 DIAGNOSIS — G44209 Tension-type headache, unspecified, not intractable: Secondary | ICD-10-CM | POA: Diagnosis not present

## 2019-05-20 DIAGNOSIS — R296 Repeated falls: Secondary | ICD-10-CM | POA: Diagnosis not present

## 2019-05-20 DIAGNOSIS — F0390 Unspecified dementia without behavioral disturbance: Secondary | ICD-10-CM

## 2019-05-20 DIAGNOSIS — R2681 Unsteadiness on feet: Secondary | ICD-10-CM | POA: Diagnosis not present

## 2019-05-20 DIAGNOSIS — R488 Other symbolic dysfunctions: Secondary | ICD-10-CM | POA: Diagnosis not present

## 2019-05-20 DIAGNOSIS — G43009 Migraine without aura, not intractable, without status migrainosus: Secondary | ICD-10-CM | POA: Diagnosis not present

## 2019-05-20 DIAGNOSIS — M6281 Muscle weakness (generalized): Secondary | ICD-10-CM | POA: Diagnosis not present

## 2019-05-20 DIAGNOSIS — F03A0 Unspecified dementia, mild, without behavioral disturbance, psychotic disturbance, mood disturbance, and anxiety: Secondary | ICD-10-CM

## 2019-05-20 NOTE — Progress Notes (Signed)
[facility]  PATIENT: Jamie George DOB: 01-26-1946  REFERRING CLINICIAN:  HISTORY FROM: patient and daughter  REASON FOR VISIT: follow up   HISTORICAL  CHIEF COMPLAINT:  Chief Complaint  Patient presents with  . Dementia    rm 6, one year FU Lives at [facility], dgtrLarene George  MMSE 24,  "sleeping more during the day due to c/o headaches"    HISTORY OF PRESENT ILLNESS:   UPDATE (05/20/19, VRP): Since last visit, doing well at [facility]. Memory loss stable. Mood stable. Tolerating meds. Complaining of headaches; on tylenol, zonisamide, verapamil.   UPDATE (04/09/18, VRP): Since last visit, now living at [facility]. Has been to ER several times for hip pain. Has been to [facility]. Now on risperdal and mirtazipine. Pt c/o HA and insomnia.   UPDATE (12/17/17, VRP): Since last visit, doing poorly. Symptoms are progressive. Now with rage outbursts, throwing things, mood swings subjective shortness of breath. Now cannot handle her own meds. Agitated.   PRIOR HPI (07/06/17, VRP): 74 year old female here for evaluation of memory loss.  I asked patient why she was referred here today, and she told me "my daughter asked me to come here".  Patient realizes that daughter is concerned about mild memory loss.  Patient does not feel like she has any significant memory problems.  According to daughter patient was living in [place] with husband until 03-25-2015 when he passed away.  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  Patient developed some depression following this.  Patient was having more difficulty living at home, operating appliances such as TV, blender, stove, coffee maker.  Patient's daughter was visiting her from [place] on a weekly basis.  Patient had thrown away several appliances because she told her  daughter they "did not work".  Patient was trying to miss appointments.  She was having increasing questions and repetitive conversations with daughter.  She was losing weight, eating less, and having other problems.  Therefore patient's daughter arranged for patient to move to [facility] in February 2019.  Patient continues to have some early morning confusion.  This prompted family to arrange 3 hours of home health aide care.  Patient is able to perform most of her activities of daily living such as dressing, toileting, eating and transferring.  She needs some help washing her hair but otherwise is able to bathe herself.  Ron Parker ADL score 5 out of 6.  Regarding instrumental activities of daily living --> [place] instrumental ADL score 4 out of 8. She still able to use a telephone, do light housework, do simple financial transactions.  However she needs help with shopping, laundry, transportation, medications, food preparation.     REVIEW OF SYSTEMS: Full 14 system review of systems performed and negative with exception of: as per HPI.    ALLERGIES: No Known Allergies  HOME MEDICATIONS: Outpatient Medications Prior to Visit  Medication Sig Dispense Refill  . aspirin EC 81 MG EC tablet Take 1 tablet (81 mg total) by mouth daily. With food 120 tablet 2  . atorvastatin (LIPITOR) 40 MG tablet Take 40 mg by mouth 2 (two) times a week. Sundays and Thursdays    . donepezil (ARICEPT) 10 MG tablet Take 10 mg by mouth at bedtime.    Marland Kitchen escitalopram (LEXAPRO) 20 MG tablet Take 20 mg by mouth daily.   0  .  ferrous sulfate 325 (65 FE) MG tablet Take 325 mg by mouth 2 (two) times daily with a meal.    . fluticasone (FLONASE) 50 MCG/ACT nasal spray Place 1 spray into both nostrils daily.    Marland Kitchen HYDROcodone-acetaminophen (NORCO/VICODIN) 5-325 MG tablet Take 1 tablet by mouth every 6 (six) hours as needed for moderate pain. 20 tablet 0  . Melatonin-Theanine (MELATONIN PLUS L-THEANINE) 10-5.5 MG TABS Take 1 tablet  by mouth at bedtime.    . memantine (NAMENDA) 10 MG tablet Take 1 tablet (10 mg total) by mouth 2 (two) times daily. 60 tablet 12  . mirtazapine (REMERON) 7.5 MG tablet Take 7.5 mg by mouth at bedtime.    . pantoprazole (PROTONIX) 40 MG tablet Take 40 mg by mouth 2 (two) times daily.    . risperiDONE (RISPERDAL) 0.5 MG tablet Take 1 mg by mouth at bedtime.     . traZODone (DESYREL) 50 MG tablet Take 50 mg by mouth at bedtime.   0  . verapamil (CALAN-SR) 120 MG CR tablet Take 120 mg by mouth daily.    Marland Kitchen zonisamide (ZONEGRAN) 100 MG capsule Take 100 mg by mouth at bedtime.     . pantoprazole (PROTONIX) 40 MG tablet Take 1 tablet (40 mg total) by mouth 2 (two) times daily before a meal. 60 tablet 2  . acetaminophen (TYLENOL 8 HOUR) 650 MG CR tablet Take 1 tablet (650 mg total) by mouth every 8 (eight) hours as needed for pain. (Patient not taking: Reported on 02/03/2019) 15 tablet 0  . acetaminophen (TYLENOL) 325 MG tablet Take 650 mg by mouth every 8 (eight) hours as needed for mild pain.    Marland Kitchen aspirin-acetaminophen-caffeine (EXCEDRIN MIGRAINE) 250-250-65 MG tablet Take 1 tablet by mouth every 6 (six) hours as needed for headache.    . mupirocin ointment (BACTROBAN) 2 %     . ondansetron (ZOFRAN) 4 MG tablet Take 1 tablet (4 mg total) by mouth every 6 (six) hours as needed for nausea. (Patient not taking: Reported on 02/03/2019) 20 tablet 0  . polyethylene glycol (MIRALAX / GLYCOLAX) packet Take 17 g by mouth daily.    Marland Kitchen acetaminophen (TYLENOL) 325 MG tablet Take 650 mg by mouth every 6 (six) hours as needed for moderate pain.     No facility-administered medications prior to visit.    PAST MEDICAL HISTORY: Past Medical History:  Diagnosis Date  . Cancer ([place])    skin  . Complication of anesthesia    " difficult to wake me up "  . Depression   . Duodenitis   . GERD (gastroesophageal reflux disease)   . Insomnia   . Migraines   . Mild dementia ([place])     PAST SURGICAL HISTORY: Past  Surgical History:  Procedure Laterality Date  . ABDOMINAL HYSTERECTOMY    . BACK SURGERY    . BIOPSY  03/04/2018   Procedure: BIOPSY;  Surgeon: Otis Brace, MD;  Location: [facility];  Service: Gastroenterology;;  . BRAIN SURGERY  2013   meningioma  . ENTEROSCOPY N/A 03/04/2018   Procedure: PUSH ENTEROSCOPY;  Surgeon: Otis Brace, MD;  Location: [facility];  Service: Gastroenterology;  Laterality: N/A;  . SHOULDER SURGERY      FAMILY HISTORY: Family History  Problem Relation Age of Onset  . Cancer Mother   . Stroke Father   . Heart disease Father     SOCIAL HISTORY:  Social History   Socioeconomic History  . Marital status: Widowed    Spouse  name: Not on file  . Number of children: 2  . Years of education: 70  . Highest education level: Not on file  Occupational History    Comment: retired  Tobacco Use  . Smoking status: Former Research scientist (life sciences)  . Smokeless tobacco: Former Network engineer and Sexual Activity  . Alcohol use: Not Currently  . Drug use: Never  . Sexual activity: Not Currently  Other Topics Concern  . Not on file  Social History Narrative   12/17/17 Lives alone in home.  Retired.  Widowed.  Children 2 (daughter, Jamie George).     Caffeine some.    Social Determinants of Health   Financial Resource Strain:   . Difficulty of Paying Living Expenses:   Food Insecurity:   . Worried About Charity fundraiser in the Last Year:   . Arboriculturist in the Last Year:   Transportation Needs:   . Film/video editor (Medical):   Marland Kitchen Lack of Transportation (Non-Medical):   Physical Activity:   . Days of Exercise per Week:   . Minutes of Exercise per Session:   Stress:   . Feeling of Stress :   Social Connections:   . Frequency of Communication with Friends and Family:   . Frequency of Social Gatherings with Friends and Family:   . Attends Religious Services:   . Active Member of Clubs or Organizations:   . Attends Archivist Meetings:   Marland Kitchen  Marital Status:   Intimate Partner Violence:   . Fear of Current or Ex-Partner:   . Emotionally Abused:   Marland Kitchen Physically Abused:   . Sexually Abused:      PHYSICAL EXAM  GENERAL EXAM/CONSTITUTIONAL: Vitals:  Vitals:   05/20/19 1046  BP: 129/80  Pulse: 89  Temp: (!) 97.4 F (36.3 C)  Weight: 161 lb 9.6 oz (73.3 kg)  Height: 5\' 6"  (1.676 m)   Body mass index is 26.08 kg/m. No exam data present  Patient is in no distress; well developed, nourished and groomed; neck is supple  LEFT UPPER LIP STERISTRIP / SWELLING (RECENT MOHS SURGERY)  CARDIOVASCULAR:  Examination of carotid arteries is normal; no carotid bruits  Regular rate and rhythm, no murmurs  Examination of peripheral vascular system by observation and palpation is normal  EYES:  Ophthalmoscopic exam of optic discs and posterior segments is normal; no papilledema or hemorrhages  MUSCULOSKELETAL:  Gait, strength, tone, movements noted in Neurologic exam below  NEUROLOGIC: MENTAL STATUS:  MMSE - [place] Exam 05/20/2019 04/09/2018 12/17/2017  Orientation to time 3 1 3   Orientation to Place 4 3 4   Registration 3 3 3   Attention/ Calculation 3 2 4   Recall 2 3 2   Language- name 2 objects 2 2 2   Language- repeat 1 1 1   Language- follow 3 step command 3 1 3   Language- read & follow direction 1 1 1   Write a sentence 1 1 1   Copy design 1 1 1   Total score 24 19 25     CALM oriented to person, place and time  DECR MEMORY   DECR attention and concentration  language fluent, comprehension intact, naming intact,   fund of knowledge appropriate  DECR INSIGHT   CRANIAL NERVE:   2nd - no papilledema on fundoscopic exam  2nd, 3rd, 4th, 6th - pupils equal and reactive to light, visual fields full to confrontation, extraocular muscles intact, no nystagmus  5th - facial sensation symmetric  7th - facial strength symmetric  8th - hearing intact  9th - palate elevates symmetrically, uvula  midline  11th - shoulder shrug symmetric  12th - tongue protrusion midline  MOTOR:   normal bulk and tone, full strength in the BUE, BLE  SENSORY:   normal and symmetric to light touch  COORDINATION:   finger-nose-finger, fine finger movements normal  REFLEXES:   deep tendon reflexes TRACE and symmetric  GAIT/STATION:   narrow based gait; USING WALKER    DIAGNOSTIC DATA (LABS, IMAGING, TESTING) - I reviewed patient records, labs, notes, testing and imaging myself where available.  Lab Results  Component Value Date   WBC 13.6 (H) 04/02/2019   HGB 16.3 (H) 04/02/2019   HCT 52.6 (H) 04/02/2019   MCV 96.7 04/02/2019   PLT 316 04/02/2019      Component Value Date/Time   NA 142 04/02/2019 2117   K 3.9 04/02/2019 2117   CL 105 04/02/2019 2117   CO2 26 04/02/2019 2117   GLUCOSE 110 (H) 04/02/2019 2117   BUN 16 04/02/2019 2117   CREATININE 1.30 (H) 04/02/2019 2117   CALCIUM 8.8 (L) 04/02/2019 2117   PROT 6.4 (L) 03/31/2018 1103   ALBUMIN 3.6 03/31/2018 1103   AST 13 (L) 03/31/2018 1103   ALT 9 03/31/2018 1103   ALKPHOS 59 03/31/2018 1103   BILITOT 0.6 03/31/2018 1103   GFRNONAA 40 (L) 04/02/2019 2117   GFRAA 46 (L) 04/02/2019 2117   Lab Results  Component Value Date   CHOL 216 (H) 08/13/2017   HDL 59 08/13/2017   LDLCALC 137 (H) 08/13/2017   TRIG 102 08/13/2017   CHOLHDL 3.7 08/13/2017   No results found for: HGBA1C Lab Results  Component Value Date   VITAMINB12 519 08/13/2017   Lab Results  Component Value Date   TSH 1.627 08/13/2017    B12 503  TSH 0.88  11/17/17 CT HEAD: 1. No acute intracranial process. 2. Status post LEFT frontal craniotomy for meningioma resection with similar LEFT frontal encephalomalacia. 3. 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 Mild chronic small vessel ischemic changes.  11/17/17 CT ORBITS: 1. Normal contrast enhanced CT orbits.     ASSESSMENT AND PLAN  75 y.o. year old female here with gradual  onset progressive short-term memory loss, confusion, decline in activities of daily living, decreased insight, depression, most consistent with neurodegenerative dementia. Now with significant mood changes and behavior changes   Dx: mil-moderate dementia with behavior disturbance (AD vs FTD)  1. Mild dementia ([place])   2. Migraine without aura and without status migrainosus, not intractable   3. Tension headache     PLAN:  DEMENTIA WITH BEHAVIORAL DISTURBANCE - continue donepezil and memantine 10mg  twice a day  - continue lexapro, risperdal, mirtazipine per PCP - caregiver resources reviewed  ANXIETY / INSOMNIA - per psychiatry for mood stabilization  INTERMITTENT HEADACHES (MIGRAINE + TENSION) - continue zonisamide, verapamil - continue tylenol as needed - may try ibuprofen as needed  Return for return to PCP.  I spent 15 minutes of face-to-face and non-face-to-face time with patient.  This included previsit chart review, lab review, study review, order entry, electronic health record documentation, patient education.       Penni Bombard, MD [id number], [id number] AM Certified in Neurology, Neurophysiology and Neuroimaging  [facility] [address] [place], [place] [address] [phone]

## 2019-05-21 DIAGNOSIS — M6281 Muscle weakness (generalized): Secondary | ICD-10-CM | POA: Diagnosis not present

## 2019-05-22 DIAGNOSIS — R2681 Unsteadiness on feet: Secondary | ICD-10-CM | POA: Diagnosis not present

## 2019-05-22 DIAGNOSIS — R488 Other symbolic dysfunctions: Secondary | ICD-10-CM | POA: Diagnosis not present

## 2019-05-22 DIAGNOSIS — M6281 Muscle weakness (generalized): Secondary | ICD-10-CM | POA: Diagnosis not present

## 2019-05-22 DIAGNOSIS — R296 Repeated falls: Secondary | ICD-10-CM | POA: Diagnosis not present

## 2019-05-23 DIAGNOSIS — R488 Other symbolic dysfunctions: Secondary | ICD-10-CM | POA: Diagnosis not present

## 2019-05-23 DIAGNOSIS — R2681 Unsteadiness on feet: Secondary | ICD-10-CM | POA: Diagnosis not present

## 2019-05-23 DIAGNOSIS — R296 Repeated falls: Secondary | ICD-10-CM | POA: Diagnosis not present

## 2019-05-23 DIAGNOSIS — M6281 Muscle weakness (generalized): Secondary | ICD-10-CM | POA: Diagnosis not present

## 2019-05-26 DIAGNOSIS — R488 Other symbolic dysfunctions: Secondary | ICD-10-CM | POA: Diagnosis not present

## 2019-05-26 DIAGNOSIS — M6281 Muscle weakness (generalized): Secondary | ICD-10-CM | POA: Diagnosis not present

## 2019-05-26 DIAGNOSIS — R2681 Unsteadiness on feet: Secondary | ICD-10-CM | POA: Diagnosis not present

## 2019-05-26 DIAGNOSIS — R296 Repeated falls: Secondary | ICD-10-CM | POA: Diagnosis not present

## 2019-05-27 DIAGNOSIS — R488 Other symbolic dysfunctions: Secondary | ICD-10-CM | POA: Diagnosis not present

## 2019-05-27 DIAGNOSIS — R296 Repeated falls: Secondary | ICD-10-CM | POA: Diagnosis not present

## 2019-05-27 DIAGNOSIS — M6281 Muscle weakness (generalized): Secondary | ICD-10-CM | POA: Diagnosis not present

## 2019-05-27 DIAGNOSIS — R2681 Unsteadiness on feet: Secondary | ICD-10-CM | POA: Diagnosis not present

## 2019-05-28 DIAGNOSIS — R2681 Unsteadiness on feet: Secondary | ICD-10-CM | POA: Diagnosis not present

## 2019-05-28 DIAGNOSIS — M6281 Muscle weakness (generalized): Secondary | ICD-10-CM | POA: Diagnosis not present

## 2019-05-28 DIAGNOSIS — R296 Repeated falls: Secondary | ICD-10-CM | POA: Diagnosis not present

## 2019-05-28 DIAGNOSIS — R488 Other symbolic dysfunctions: Secondary | ICD-10-CM | POA: Diagnosis not present

## 2019-05-29 DIAGNOSIS — M6281 Muscle weakness (generalized): Secondary | ICD-10-CM | POA: Diagnosis not present

## 2019-05-29 DIAGNOSIS — R488 Other symbolic dysfunctions: Secondary | ICD-10-CM | POA: Diagnosis not present

## 2019-05-29 DIAGNOSIS — R296 Repeated falls: Secondary | ICD-10-CM | POA: Diagnosis not present

## 2019-05-29 DIAGNOSIS — R2681 Unsteadiness on feet: Secondary | ICD-10-CM | POA: Diagnosis not present

## 2019-05-30 DIAGNOSIS — R2681 Unsteadiness on feet: Secondary | ICD-10-CM | POA: Diagnosis not present

## 2019-05-30 DIAGNOSIS — R296 Repeated falls: Secondary | ICD-10-CM | POA: Diagnosis not present

## 2019-05-30 DIAGNOSIS — M6281 Muscle weakness (generalized): Secondary | ICD-10-CM | POA: Diagnosis not present

## 2019-05-30 DIAGNOSIS — R488 Other symbolic dysfunctions: Secondary | ICD-10-CM | POA: Diagnosis not present

## 2019-06-03 DIAGNOSIS — R296 Repeated falls: Secondary | ICD-10-CM | POA: Diagnosis not present

## 2019-06-03 DIAGNOSIS — M6281 Muscle weakness (generalized): Secondary | ICD-10-CM | POA: Diagnosis not present

## 2019-06-03 DIAGNOSIS — R488 Other symbolic dysfunctions: Secondary | ICD-10-CM | POA: Diagnosis not present

## 2019-06-03 DIAGNOSIS — R2681 Unsteadiness on feet: Secondary | ICD-10-CM | POA: Diagnosis not present

## 2019-06-05 DIAGNOSIS — R488 Other symbolic dysfunctions: Secondary | ICD-10-CM | POA: Diagnosis not present

## 2019-06-05 DIAGNOSIS — R2681 Unsteadiness on feet: Secondary | ICD-10-CM | POA: Diagnosis not present

## 2019-06-05 DIAGNOSIS — R296 Repeated falls: Secondary | ICD-10-CM | POA: Diagnosis not present

## 2019-06-05 DIAGNOSIS — M6281 Muscle weakness (generalized): Secondary | ICD-10-CM | POA: Diagnosis not present

## 2019-06-06 DIAGNOSIS — R2681 Unsteadiness on feet: Secondary | ICD-10-CM | POA: Diagnosis not present

## 2019-06-06 DIAGNOSIS — R296 Repeated falls: Secondary | ICD-10-CM | POA: Diagnosis not present

## 2019-06-06 DIAGNOSIS — R488 Other symbolic dysfunctions: Secondary | ICD-10-CM | POA: Diagnosis not present

## 2019-06-06 DIAGNOSIS — M6281 Muscle weakness (generalized): Secondary | ICD-10-CM | POA: Diagnosis not present

## 2019-06-10 DIAGNOSIS — R488 Other symbolic dysfunctions: Secondary | ICD-10-CM | POA: Diagnosis not present

## 2019-06-10 DIAGNOSIS — R2681 Unsteadiness on feet: Secondary | ICD-10-CM | POA: Diagnosis not present

## 2019-06-10 DIAGNOSIS — R296 Repeated falls: Secondary | ICD-10-CM | POA: Diagnosis not present

## 2019-06-10 DIAGNOSIS — M6281 Muscle weakness (generalized): Secondary | ICD-10-CM | POA: Diagnosis not present

## 2019-06-12 DIAGNOSIS — R488 Other symbolic dysfunctions: Secondary | ICD-10-CM | POA: Diagnosis not present

## 2019-06-12 DIAGNOSIS — R296 Repeated falls: Secondary | ICD-10-CM | POA: Diagnosis not present

## 2019-06-12 DIAGNOSIS — R2681 Unsteadiness on feet: Secondary | ICD-10-CM | POA: Diagnosis not present

## 2019-06-12 DIAGNOSIS — M6281 Muscle weakness (generalized): Secondary | ICD-10-CM | POA: Diagnosis not present

## 2019-06-13 DIAGNOSIS — R2681 Unsteadiness on feet: Secondary | ICD-10-CM | POA: Diagnosis not present

## 2019-06-13 DIAGNOSIS — R488 Other symbolic dysfunctions: Secondary | ICD-10-CM | POA: Diagnosis not present

## 2019-06-13 DIAGNOSIS — M6281 Muscle weakness (generalized): Secondary | ICD-10-CM | POA: Diagnosis not present

## 2019-06-13 DIAGNOSIS — R296 Repeated falls: Secondary | ICD-10-CM | POA: Diagnosis not present

## 2019-06-17 DIAGNOSIS — R296 Repeated falls: Secondary | ICD-10-CM | POA: Diagnosis not present

## 2019-06-17 DIAGNOSIS — R2681 Unsteadiness on feet: Secondary | ICD-10-CM | POA: Diagnosis not present

## 2019-06-17 DIAGNOSIS — M6281 Muscle weakness (generalized): Secondary | ICD-10-CM | POA: Diagnosis not present

## 2019-06-17 DIAGNOSIS — R488 Other symbolic dysfunctions: Secondary | ICD-10-CM | POA: Diagnosis not present

## 2019-06-19 DIAGNOSIS — M6281 Muscle weakness (generalized): Secondary | ICD-10-CM | POA: Diagnosis not present

## 2019-06-19 DIAGNOSIS — R488 Other symbolic dysfunctions: Secondary | ICD-10-CM | POA: Diagnosis not present

## 2019-06-19 DIAGNOSIS — R2681 Unsteadiness on feet: Secondary | ICD-10-CM | POA: Diagnosis not present

## 2019-06-19 DIAGNOSIS — R296 Repeated falls: Secondary | ICD-10-CM | POA: Diagnosis not present

## 2019-06-23 DIAGNOSIS — R488 Other symbolic dysfunctions: Secondary | ICD-10-CM | POA: Diagnosis not present

## 2019-06-23 DIAGNOSIS — M6281 Muscle weakness (generalized): Secondary | ICD-10-CM | POA: Diagnosis not present

## 2019-06-23 DIAGNOSIS — R2681 Unsteadiness on feet: Secondary | ICD-10-CM | POA: Diagnosis not present

## 2019-06-23 DIAGNOSIS — R296 Repeated falls: Secondary | ICD-10-CM | POA: Diagnosis not present

## 2019-07-02 DIAGNOSIS — R2681 Unsteadiness on feet: Secondary | ICD-10-CM | POA: Diagnosis not present

## 2019-07-02 DIAGNOSIS — M6281 Muscle weakness (generalized): Secondary | ICD-10-CM | POA: Diagnosis not present

## 2019-07-02 DIAGNOSIS — R296 Repeated falls: Secondary | ICD-10-CM | POA: Diagnosis not present

## 2019-07-02 DIAGNOSIS — R488 Other symbolic dysfunctions: Secondary | ICD-10-CM | POA: Diagnosis not present

## 2019-07-04 ENCOUNTER — Emergency Department (HOSPITAL_COMMUNITY)
Admission: EM | Admit: 2019-07-04 | Discharge: 2019-07-04 | Disposition: A | Discharge status: Home / Self Care | Payer: Medicare PPO | Attending: Emergency Medicine | Admitting: Emergency Medicine

## 2019-07-04 ENCOUNTER — Other Ambulatory Visit: Payer: Self-pay

## 2019-07-04 ENCOUNTER — Emergency Department (HOSPITAL_COMMUNITY): Payer: Medicare PPO

## 2019-07-04 ENCOUNTER — Encounter (HOSPITAL_COMMUNITY): Payer: Self-pay | Admitting: Emergency Medicine

## 2019-07-04 DIAGNOSIS — R296 Repeated falls: Secondary | ICD-10-CM | POA: Diagnosis not present

## 2019-07-04 DIAGNOSIS — F0390 Unspecified dementia without behavioral disturbance: Secondary | ICD-10-CM | POA: Insufficient documentation

## 2019-07-04 DIAGNOSIS — Z7982 Long term (current) use of aspirin: Secondary | ICD-10-CM | POA: Insufficient documentation

## 2019-07-04 DIAGNOSIS — Z79899 Other long term (current) drug therapy: Secondary | ICD-10-CM | POA: Insufficient documentation

## 2019-07-04 DIAGNOSIS — Z743 Need for continuous supervision: Secondary | ICD-10-CM | POA: Diagnosis not present

## 2019-07-04 DIAGNOSIS — M6281 Muscle weakness (generalized): Secondary | ICD-10-CM | POA: Diagnosis not present

## 2019-07-04 DIAGNOSIS — R109 Unspecified abdominal pain: Secondary | ICD-10-CM | POA: Diagnosis not present

## 2019-07-04 DIAGNOSIS — R519 Headache, unspecified: Secondary | ICD-10-CM | POA: Insufficient documentation

## 2019-07-04 DIAGNOSIS — Z87891 Personal history of nicotine dependence: Secondary | ICD-10-CM | POA: Insufficient documentation

## 2019-07-04 DIAGNOSIS — R2681 Unsteadiness on feet: Secondary | ICD-10-CM | POA: Diagnosis not present

## 2019-07-04 DIAGNOSIS — R0789 Other chest pain: Secondary | ICD-10-CM | POA: Diagnosis not present

## 2019-07-04 DIAGNOSIS — I129 Hypertensive chronic kidney disease with stage 1 through stage 4 chronic kidney disease, or unspecified chronic kidney disease: Secondary | ICD-10-CM | POA: Insufficient documentation

## 2019-07-04 DIAGNOSIS — R079 Chest pain, unspecified: Secondary | ICD-10-CM | POA: Diagnosis not present

## 2019-07-04 DIAGNOSIS — N189 Chronic kidney disease, unspecified: Secondary | ICD-10-CM | POA: Diagnosis not present

## 2019-07-04 DIAGNOSIS — R279 Unspecified lack of coordination: Secondary | ICD-10-CM | POA: Diagnosis not present

## 2019-07-04 DIAGNOSIS — R52 Pain, unspecified: Secondary | ICD-10-CM | POA: Diagnosis not present

## 2019-07-04 DIAGNOSIS — G4489 Other headache syndrome: Secondary | ICD-10-CM | POA: Diagnosis not present

## 2019-07-04 DIAGNOSIS — R404 Transient alteration of awareness: Secondary | ICD-10-CM | POA: Diagnosis not present

## 2019-07-04 DIAGNOSIS — R488 Other symbolic dysfunctions: Secondary | ICD-10-CM | POA: Diagnosis not present

## 2019-07-04 DIAGNOSIS — R0902 Hypoxemia: Secondary | ICD-10-CM | POA: Diagnosis not present

## 2019-07-04 DIAGNOSIS — I10 Essential (primary) hypertension: Secondary | ICD-10-CM | POA: Diagnosis not present

## 2019-07-04 LAB — BASIC METABOLIC PANEL
Anion gap: 9 (ref 5–15)
BUN: 16 mg/dL (ref 8–23)
CO2: 27 mmol/L (ref 22–32)
Calcium: 9 mg/dL (ref 8.9–10.3)
Chloride: 104 mmol/L (ref 98–111)
Creatinine, Ser: 1.27 mg/dL — ABNORMAL HIGH (ref 0.44–1.00)
GFR calc Af Amer: 48 mL/min — ABNORMAL LOW (ref >60)
GFR calc non Af Amer: 41 mL/min — ABNORMAL LOW (ref >60)
Glucose, Bld: 86 mg/dL (ref 70–99)
Potassium: 4 mmol/L (ref 3.5–5.1)
Sodium: 140 mmol/L (ref 135–145)

## 2019-07-04 LAB — URINALYSIS, ROUTINE W REFLEX MICROSCOPIC
Bilirubin Urine: NEGATIVE
Glucose, UA: NEGATIVE mg/dL
Hgb urine dipstick: NEGATIVE
Ketones, ur: NEGATIVE mg/dL
Leukocytes,Ua: NEGATIVE
Nitrite: NEGATIVE
Protein, ur: NEGATIVE mg/dL
Specific Gravity, Urine: 1.019 (ref 1.005–1.030)
pH: 7 (ref 5.0–8.0)

## 2019-07-04 LAB — CBC WITH DIFFERENTIAL/PLATELET
Abs Immature Granulocytes: 0.04 10*3/uL (ref 0.00–0.07)
Basophils Absolute: 0.2 10*3/uL — ABNORMAL HIGH (ref 0.0–0.1)
Basophils Relative: 2 %
Eosinophils Absolute: 0.4 10*3/uL (ref 0.0–0.5)
Eosinophils Relative: 4 %
HCT: 52.3 % — ABNORMAL HIGH (ref 36.0–46.0)
Hemoglobin: 16.1 g/dL — ABNORMAL HIGH (ref 12.0–15.0)
Immature Granulocytes: 0 %
Lymphocytes Relative: 19 %
Lymphs Abs: 2.1 10*3/uL (ref 0.7–4.0)
MCH: 29 pg (ref 26.0–34.0)
MCHC: 30.8 g/dL (ref 30.0–36.0)
MCV: 94.2 fL (ref 80.0–100.0)
Monocytes Absolute: 1.1 10*3/uL — ABNORMAL HIGH (ref 0.1–1.0)
Monocytes Relative: 10 %
Neutro Abs: 7.2 10*3/uL (ref 1.7–7.7)
Neutrophils Relative %: 65 %
Platelets: 318 10*3/uL (ref 150–400)
RBC: 5.55 MIL/uL — ABNORMAL HIGH (ref 3.87–5.11)
RDW: 14.8 % (ref 11.5–15.5)
WBC: 10.9 10*3/uL — ABNORMAL HIGH (ref 4.0–10.5)
nRBC: 0 % (ref 0.0–0.2)

## 2019-07-04 LAB — TROPONIN I (HIGH SENSITIVITY)
Troponin I (High Sensitivity): 3 ng/L (ref <18)
Troponin I (High Sensitivity): 4 ng/L (ref <18)

## 2019-07-04 MED ORDER — ACETAMINOPHEN 325 MG PO TABS
650.0000 mg | ORAL_TABLET | Freq: Once | ORAL | Status: AC
  Administered 2019-07-04: 650 mg via ORAL
  Filled 2019-07-04: qty 2

## 2019-07-04 MED ORDER — CLONIDINE HCL 0.1 MG PO TABS
0.1000 mg | ORAL_TABLET | Freq: Once | ORAL | Status: AC
  Administered 2019-07-04: 0.1 mg via ORAL
  Filled 2019-07-04: qty 1

## 2019-07-04 MED ORDER — IOHEXOL 350 MG/ML SOLN
100.0000 mL | Freq: Once | INTRAVENOUS | Status: AC | PRN
  Administered 2019-07-04: 80 mL via INTRAVENOUS

## 2019-07-04 MED ORDER — SODIUM CHLORIDE (PF) 0.9 % IJ SOLN
INTRAMUSCULAR | Status: AC
  Filled 2019-07-04: qty 50

## 2019-07-04 MED ORDER — FENTANYL CITRATE (PF) 100 MCG/2ML IJ SOLN
25.0000 ug | Freq: Once | INTRAMUSCULAR | Status: AC
  Administered 2019-07-04: 25 ug via INTRAVENOUS
  Filled 2019-07-04: qty 2

## 2019-07-04 NOTE — Discharge Instructions (Addendum)
Your CT scan did not show any evidence of a dissection.  However there are chronic findings concerning for a chronic atypical infection they will need to tell your primary care provider about.  You may need a repeat CT scan of your chest in 3 months. Continue taking your home occasions as previously prescribed. Return to the ED for any worsening chest pain, headache, injuries or falls, numbness in arms or legs.

## 2019-07-04 NOTE — ED Provider Notes (Signed)
[facility] DEPT Provider Note   CSN: [id number] Arrival date & time: 07/04/19  1409     History Chief Complaint  Patient presents with  . Headache  . Chest Pain    Jamie George is a 74 y.o. female with past medical history of dementia, hypertension, CKD presenting to the ED with a chief complaint of body aches, nausea.  Reports chest pain, abdominal pain, headache.  Reports 3-day history of decreased appetite, body aches, nausea.  She felt 3 days ago that "had chest pain like I was having a heart attack."  Remainder of history is limited as patient is frustrated because she wants to speak to her daughter.  Denies any numbness in arms or legs, vomiting, diarrhea.  HPI     Past Medical History:  Diagnosis Date  . Cancer ([place])    skin  . Complication of anesthesia    " difficult to wake me up "  . Depression   . Duodenitis   . GERD (gastroesophageal reflux disease)   . Insomnia   . Migraines   . Mild dementia [facility])     Patient Active Problem List   Diagnosis Date Noted  . Dementia ([place]) 01/02/2018  . Aggression 01/02/2018  . Dissection of abdominal aorta ([place]) 08/13/2017  . Chest pain 08/13/2017  . Hypertension 08/13/2017  . CKD (chronic kidney disease) 08/13/2017  . Anemia 08/13/2017    Past Surgical History:  Procedure Laterality Date  . ABDOMINAL HYSTERECTOMY    . BACK SURGERY    . BIOPSY  03/04/2018   Procedure: BIOPSY;  Surgeon: Otis Brace, MD;  Location: [facility] ENDOSCOPY;  Service: Gastroenterology;;  . BRAIN SURGERY  2013   meningioma  . ENTEROSCOPY N/A 03/04/2018   Procedure: PUSH ENTEROSCOPY;  Surgeon: Otis Brace, MD;  Location: [facility] ENDOSCOPY;  Service: Gastroenterology;  Laterality: N/A;  . SHOULDER SURGERY       OB History   No obstetric history on file.     Family History  Problem Relation Age of Onset  . Cancer Mother   . Stroke Father   . Heart disease Father     Social History   Tobacco  Use  . Smoking status: Former Research scientist (life sciences)  . Smokeless tobacco: Former Network engineer Use Topics  . Alcohol use: Not Currently  . Drug use: Never    Home Medications Prior to Admission medications   Medication Sig Start Date End Date Taking? Authorizing Provider  acetaminophen (TYLENOL) 325 MG tablet Take 650 mg by mouth in the morning and at bedtime.    Yes [provider]  aspirin EC 81 MG EC tablet Take 1 tablet (81 mg total) by mouth daily. With food 08/15/17  Yes Emokpae, Courage, MD  atorvastatin (LIPITOR) 40 MG tablet Take 40 mg by mouth daily.    Yes [provider]  docusate sodium (COLACE) 100 MG capsule Take 100 mg by mouth daily.   Yes [provider]  donepezil (ARICEPT) 10 MG tablet Take 10 mg by mouth at bedtime.   Yes [provider]  escitalopram (LEXAPRO) 20 MG tablet Take 20 mg by mouth daily.  05/03/17  Yes [provider]  ferrous sulfate 325 (65 FE) MG tablet Take 325 mg by mouth 2 (two) times daily with a meal.   Yes [provider]  fluticasone (FLONASE) 50 MCG/ACT nasal spray Place 1 spray into both nostrils daily.   Yes [provider]  melatonin 5 MG TABS Take 10  mg by mouth at bedtime.   Yes [provider]  memantine (NAMENDA) 10 MG tablet Take 1 tablet (10 mg total) by mouth 2 (two) times daily. 12/17/17  Yes Penumalli, Earlean Polka, MD  mirtazapine (REMERON) 7.5 MG tablet Take 7.5 mg by mouth at bedtime.   Yes [provider]  pantoprazole (PROTONIX) 40 MG tablet Take 40 mg by mouth daily.    Yes [provider]  polyethylene glycol (MIRALAX / GLYCOLAX) packet Take 17 g by mouth daily.   Yes [provider]  risperiDONE (RISPERDAL) 0.5 MG tablet Take 1 mg by mouth at bedtime.    Yes [provider]  traZODone (DESYREL) 50 MG tablet Take 50 mg by mouth at bedtime.  05/03/17  Yes [provider]  verapamil (CALAN-SR) 120 MG CR tablet Take 120 mg by mouth  daily.   Yes [provider]  zonisamide (ZONEGRAN) 100 MG capsule Take 200 mg by mouth at bedtime.    Yes [provider]  acetaminophen (TYLENOL 8 HOUR) 650 MG CR tablet Take 1 tablet (650 mg total) by mouth every 8 (eight) hours as needed for pain. Patient not taking: Reported on 02/03/2019 03/31/18   Petrucelli, Glynda Jaeger, PA-C  HYDROcodone-acetaminophen (NORCO/VICODIN) 5-325 MG tablet Take 1 tablet by mouth every 6 (six) hours as needed for moderate pain. Patient not taking: Reported on 07/04/2019 02/03/19   Milton Ferguson, MD  ondansetron (ZOFRAN) 4 MG tablet Take 1 tablet (4 mg total) by mouth every 6 (six) hours as needed for nausea. Patient not taking: Reported on 02/03/2019 08/14/17   Roxan Hockey, MD    Allergies    Patient has no known allergies.  Review of Systems   Review of Systems  Constitutional: Positive for appetite change. Negative for fever.  Respiratory: Negative for shortness of breath.   Cardiovascular: Positive for chest pain.  Gastrointestinal: Positive for abdominal pain and nausea. Negative for diarrhea and vomiting.  Neurological: Positive for headaches.    Physical Exam Updated Vital Signs BP (!) 184/90   Pulse 63   Temp 97.7 F (36.5 C) (Oral)   Resp 12   Ht 5' 7.5" (1.715 m)   Wt 72.6 kg   SpO2 93%   BMI 24.69 kg/m   Physical Exam Vitals and nursing note reviewed.  Constitutional:      General: She is not in acute distress.    Appearance: She is well-developed.  HENT:     Head: Normocephalic and atraumatic.     Nose: Nose normal.  Eyes:     General: No scleral icterus.       Right eye: No discharge.        Left eye: No discharge.     Conjunctiva/sclera: Conjunctivae normal.  Cardiovascular:     Rate and Rhythm: Normal rate and regular rhythm.     Heart sounds: Normal heart sounds. No murmur. No friction rub. No gallop.   Pulmonary:     Effort: Pulmonary effort is normal. No respiratory distress.     Breath sounds:  Normal breath sounds.  Chest:     Chest wall: Tenderness present.    Abdominal:     General: Bowel sounds are normal. There is no distension.     Palpations: Abdomen is soft.     Tenderness: There is generalized abdominal tenderness. There is no guarding.  Musculoskeletal:        General: No tenderness. Normal range of motion.     Cervical back: Normal range  of motion and neck supple.     Right lower leg: No edema.     Left lower leg: No edema.     Comments: 2+ DP pulses noted bilaterally.  Skin:    General: Skin is warm and dry.     Findings: No rash.  Neurological:     General: No focal deficit present.     Mental Status: She is alert. Mental status is at baseline.     Cranial Nerves: No cranial nerve deficit.     Sensory: No sensory deficit.     Motor: No weakness or abnormal muscle tone.     Coordination: Coordination normal.     Comments: Alert, oriented to self, place, year and current president.  Unsure of the month or day of the week.  Able to name her daughter and recall her daughter's phone number. Pupils reactive. No facial asymmetry noted. Cranial nerves appear grossly intact. Sensation intact to light touch on face, BUE and BLE. Strength 5/5 in BUE and BLE.      ED Results / Procedures / Treatments   Labs (all labs ordered are listed, but only abnormal results are displayed) Labs Reviewed  BASIC METABOLIC PANEL - Abnormal; Notable for the following components:      Result Value   Creatinine, Ser 1.27 (*)    GFR calc non Af Amer 41 (*)    GFR calc Af Amer 48 (*)    All other components within normal limits  CBC WITH DIFFERENTIAL/PLATELET - Abnormal; Notable for the following components:   WBC 10.9 (*)    RBC 5.55 (*)    Hemoglobin 16.1 (*)    HCT 52.3 (*)    Monocytes Absolute 1.1 (*)    Basophils Absolute 0.2 (*)    All other components within normal limits  URINALYSIS, ROUTINE W REFLEX MICROSCOPIC - Abnormal; Notable for the following components:    APPearance CLOUDY (*)    All other components within normal limits  URINE CULTURE  TROPONIN I (HIGH SENSITIVITY)  TROPONIN I (HIGH SENSITIVITY)    EKG EKG Interpretation  Date/Time:  Friday Jul 04 2019 16:27:30 EDT Ventricular Rate:  72 PR Interval:    QRS Duration: 93 QT Interval:  431 QTC Calculation: 472 R Axis:   68 Text Interpretation: Sinus rhythm Low voltage, precordial leads Borderline T abnormalities, anterior leads Confirmed by Dene Gentry [phone]) on 07/04/2019 5:50:32 PM   Radiology DG Chest 2 View  Result Date: 07/04/2019 CLINICAL DATA:  Body aches EXAM: CHEST - 2 VIEW COMPARISON:  04/02/2019 FINDINGS: Heart is normal size. Chronic nodular densities noted in the left lung as seen on prior CT. No acute confluent opacities or effusions. No acute bony abnormality. IMPRESSION: Stable chronic changes.  No active disease. Electronically Signed   By: Rolm Baptise M.D.   On: 07/04/2019 15:50   CT Head Wo Contrast  Result Date: 07/04/2019 CLINICAL DATA:  Headache EXAM: CT HEAD WITHOUT CONTRAST TECHNIQUE: Contiguous axial images were obtained from the base of the skull through the vertex without intravenous contrast. COMPARISON:  None. FINDINGS: Brain: No acute intracranial abnormality. Specifically, no hemorrhage, hydrocephalus, mass lesion, acute infarction, or significant intracranial injury. Old right basal ganglia lacunar infarct. There is atrophy and chronic small vessel disease changes. Vascular: No hyperdense vessel or unexpected calcification. Skull: No acute calvarial abnormality. Prior left frontal craniotomy. Sinuses/Orbits: Visualized paranasal sinuses and mastoids clear. Orbital soft tissues unremarkable. Other: None IMPRESSION: Atrophy, chronic microvascular disease. No acute intracranial abnormality. Old right  basal ganglia lacunar infarct. Electronically Signed   By: Rolm Baptise M.D.   On: 07/04/2019 19:14   CT Angio Chest/Abd/Pel for Dissection W and/or  W/WO  Addendum Date: 07/04/2019   ADDENDUM REPORT: 07/04/2019 19:49 ADDENDUM: 1.9 cm LEFT thyroid lesion Recommend thyroid US, if not yet performed(ref: J Am Coll Radiol. 2015 Feb;12(2): 143-50). Electronically Signed   By: Zetta Bills M.D.   On: 07/04/2019 19:49   Result Date: 07/04/2019 CLINICAL DATA:  Chest pain, history of dissection EXAM: CT ANGIOGRAPHY CHEST, ABDOMEN AND PELVIS TECHNIQUE: Non-contrast CT of the chest was initially obtained. Multidetector CT imaging through the chest, abdomen and pelvis was performed using the standard protocol during bolus administration of intravenous contrast. Multiplanar reconstructed images and MIPs were obtained and reviewed to evaluate the vascular anatomy. CONTRAST:  75mL OMNIPAQUE IOHEXOL 350 MG/ML SOLN COMPARISON:  03/15/2017 FINDINGS: CTA CHEST FINDINGS Cardiovascular: No evidence of intramural hematoma on noncontrast imaging. Calcified atherosclerotic plaque throughout the thoracic aorta. Atheromatous plaque in the thoracic aorta without sign of dissection or aneurysmal dilation. Heart size is normal to mildly enlarged. No pericardial effusion. Central pulmonary vasculature is unremarkable on limited assessment. Mediastinum/Nodes: Heterogeneous thyroid with a peripheral enhancing lesion in the LEFT thyroid as the dominant lesion measuring 1.9 cm. No axillary lymphadenopathy. No mediastinal or hilar lymphadenopathy. Lungs/Pleura: Basilar atelectasis. Basilar atelectasis. Nodular changes along the major fissure in the LEFT chest in the lingula (image 77, series 9) 2 x 0.8 cm. Scattered areas of nodularity elsewhere in the RIGHT middle lobe and with nodule suggested amidst atelectasis at the LEFT lung base (image 70, series 9) 9 mm. There was nodularity in this location before with configuration that raise the question of atelectasis. There is also lingular nodularity in bronchiectatic changes well as similar changes in the RIGHT middle lobe on the prior exam.  Airways are patent. Musculoskeletal: No chest wall mass. See below for full musculoskeletal details. Review of the MIP images confirms the above findings. CTA ABDOMEN AND PELVIS FINDINGS VASCULAR Aorta: Nonaneurysmal abdominal aorta with signs of calcified and noncalcified plaque. Chronic appearing non flow limiting focal dissection of the anterior aspect of the infrarenal abdominal aorta is unchanged. Maximum caliber of the aorta at this level is approximately 2 cm. Celiac: Celiac axis with atheromatous changes at the origin remains patent with variant vascular anatomy showing accessory LEFT hepatic artery arising from LEFT gastric artery. SMA: SMA is patent. Atheromatous plaque at the origin similar to the prior study. Renals: 2 renal arteries on the RIGHT are widely patent. Single renal artery on the LEFT. Also patent. IMA: IMA is patent. Inflow: Patent without evidence of aneurysm, dissection, vasculitis or significant stenosis. Outflow: Atheromatous plaque in the iliac vasculature bilaterally without aneurysmal dilation. The superficial femoral and profundus femoral arteries patent in the upper thigh, incompletely imaged. Veins: Veins are not well assessed on the current study. Review of the MIP images confirms the above findings. NON-VASCULAR Hepatobiliary: No gross liver abnormality on early arterial phase imaging. No pericholecystic stranding. Mild prominence of the extrahepatic biliary tree without visible filling defect caliber similar to prior exams. There is a calcification in the head of the pancreas adjacent to the distal common bile duct. Pancreas: Calcification in the head of the pancreas similar to previous exams. Low attenuation surrounding this calcification appears improved as is ductal dilation but this areas not as well assessed as on venous phase assessments. No peripancreatic stranding or peripancreatic fluid. Spleen: Spleen normal size without focal lesion. Adrenals/Urinary Tract: Adrenal  glands are normal. Renal cortical scarring bilaterally. No hydronephrosis. Symmetric renal enhancement. Stomach/Bowel: No acute gastrointestinal process. Small bowel is nondilated. Postoperative changes about the cecum perhaps from prior appendectomy. No pericecal inflammation Lymphatic: No adenopathy in the upper abdomen or retroperitoneum. No signs of pelvic lymphadenopathy. Reproductive: Post hysterectomy. Other: No abdominal wall hernia or abnormality. No abdominopelvic ascites. Musculoskeletal: No acute bone finding. No destructive bone process. Spinal degenerative changes worse in the lumbar spine. Review of the MIP images confirms the above findings. IMPRESSION: 1. No evidence of acute aortic syndrome. 2. Chronic appearing focal dissection of the anterior aspect of the infrarenal abdominal aorta is unchanged. 3. Multifocal nodularity at the lung bases, some of which have developed since the prior exam worse when compared to the prior study. There are findings on the prior study on the current exam that suggest chronic infection with perhaps due to atypical mycobacterial infection. Consider three-month follow-up with dedicated chest CT and correlate with any respiratory symptoms. 4. Stable appearance of calcification in the head of the pancreas previously shown to be associated with ductal dilation. This may represent an intraductal calculus. Finding is unchanged dating back to June 14, 2017. 5. Mild prominence of the extrahepatic biliary tree without visible filling defect caliber similar to prior exams. 6. Aortic atherosclerosis. Aortic Atherosclerosis (ICD10-I70.0). Electronically Signed: By: Zetta Bills M.D. On: 07/04/2019 19:32    Procedures Procedures (including critical care time)  Medications Ordered in ED Medications  sodium chloride (PF) 0.9 % injection (has no administration in time range)  acetaminophen (TYLENOL) tablet 650 mg (650 mg Oral Given 07/04/19 1631)  iohexol (OMNIPAQUE) 350  MG/ML injection 100 mL (80 mLs Intravenous Contrast Given 07/04/19 1811)  fentaNYL (SUBLIMAZE) injection 25 mcg (25 mcg Intravenous Given 07/04/19 1907)    ED Course  I have reviewed the triage vital signs and the nursing notes.  Pertinent labs & imaging results that were available during my care of the patient were reviewed by me and considered in my medical decision making (see chart for details).  Clinical Course as of Jul 04 2127  Fri Jul 04, 2019  2114 Attempted to call patient's daughter, [name] without response.   [HK]    Clinical Course User Index [HK] Delia Heady, PA-C   MDM Rules/Calculators/A&P                      74 year old female with a past medical history of dementia, hypertension, CKD presenting to the ED with a chief complaint of body aches and nausea.  Reports chest pain, abdominal pain, headache and pain throughout her entire body.  Chest pain began 3 days ago.  Patient states that she would like to speak to her daughter as remainder of history is limited.  On my exam patient is alert, oriented to self, place, current president and year.  Able to recall her daughter's phone number.  No numbness or weakness noted on exam.  No facial asymmetry.  She is tender throughout her entire chest and abdomen.  No rebound or guarding noted.  Work-up here including EKG shows sinus rhythm.  BMP with creatinine of 1.7 which is similar to prior.  Urinalysis, both troponins are negative.  Chest x-ray is unremarkable.  CT of the head is negative for acute abnormality.  CT angio to rule out dissection since no evidence of dissection, does show chronic atypical infection in the lungs that has been present in prior imaging.  This will need to be rechecked in  about 3 months per recommendations.  She denies respiratory symptoms today.  Patient sleeping here and stating that she feels better.  She is requesting discharge.  At this time I believe there is no evidence of ACS, dissection, PE, CVA or  other emergent cause of her symptoms.  I tried to update her daughter per patient's request but did not receive an answer on the telephone.  Patient continues to be hemodynamically stable and will return for any worsening symptoms. Patient discussed with and seen by the attending, Dr. Francia Greaves.   All imaging, if done today, including plain films, CT scans, and ultrasounds, independently reviewed by me, and interpretations confirmed via formal radiology reads.  Patient is hemodynamically stable, in NAD. Evaluation does not show pathology that would require ongoing emergent intervention or inpatient treatment. I explained the diagnosis to the patient. Pain has been managed and has no complaints prior to discharge. Patient is comfortable with above plan and is stable for discharge at this time. All questions were answered prior to disposition. Strict return precautions for returning to the ED were discussed. Encouraged follow up with PCP.   An After Visit Summary was printed and given to the patient.   Portions of this note were generated with [employer]. Dictation errors may occur despite best attempts at proofreading.  Final Clinical Impression(s) / ED Diagnoses Final diagnoses:  Chest wall pain    Rx / DC Orders ED Discharge Orders    None       Delia Heady, PA-C 07/04/19 2130    Valarie Merino, MD 07/05/19 2333

## 2019-07-04 NOTE — ED Notes (Signed)
[facility] called  

## 2019-07-04 NOTE — ED Triage Notes (Signed)
Patient arrived by EMS from [facility]. Patient has initial complaint of chest pain upon inhalation but now only reporting Headache.   EKG was normal per EMS.

## 2019-07-06 LAB — URINE CULTURE: Culture: 10000 — AB

## 2019-07-10 ENCOUNTER — Emergency Department (HOSPITAL_COMMUNITY): Payer: Medicare PPO

## 2019-07-10 ENCOUNTER — Emergency Department (HOSPITAL_COMMUNITY)
Admission: EM | Admit: 2019-07-10 | Discharge: 2019-07-11 | Disposition: A | Discharge status: Home / Self Care | Payer: Medicare PPO | Attending: Emergency Medicine | Admitting: Emergency Medicine

## 2019-07-10 ENCOUNTER — Other Ambulatory Visit: Payer: Self-pay

## 2019-07-10 ENCOUNTER — Encounter (HOSPITAL_COMMUNITY): Payer: Self-pay

## 2019-07-10 DIAGNOSIS — R112 Nausea with vomiting, unspecified: Secondary | ICD-10-CM | POA: Diagnosis not present

## 2019-07-10 DIAGNOSIS — Z87891 Personal history of nicotine dependence: Secondary | ICD-10-CM | POA: Insufficient documentation

## 2019-07-10 DIAGNOSIS — R0789 Other chest pain: Secondary | ICD-10-CM | POA: Insufficient documentation

## 2019-07-10 DIAGNOSIS — Z79899 Other long term (current) drug therapy: Secondary | ICD-10-CM | POA: Diagnosis not present

## 2019-07-10 DIAGNOSIS — I700 Atherosclerosis of aorta: Secondary | ICD-10-CM | POA: Diagnosis not present

## 2019-07-10 DIAGNOSIS — R488 Other symbolic dysfunctions: Secondary | ICD-10-CM | POA: Diagnosis not present

## 2019-07-10 DIAGNOSIS — Z7982 Long term (current) use of aspirin: Secondary | ICD-10-CM | POA: Insufficient documentation

## 2019-07-10 DIAGNOSIS — R1084 Generalized abdominal pain: Secondary | ICD-10-CM | POA: Insufficient documentation

## 2019-07-10 DIAGNOSIS — R2681 Unsteadiness on feet: Secondary | ICD-10-CM | POA: Diagnosis not present

## 2019-07-10 DIAGNOSIS — N189 Chronic kidney disease, unspecified: Secondary | ICD-10-CM | POA: Diagnosis not present

## 2019-07-10 DIAGNOSIS — I129 Hypertensive chronic kidney disease with stage 1 through stage 4 chronic kidney disease, or unspecified chronic kidney disease: Secondary | ICD-10-CM | POA: Diagnosis not present

## 2019-07-10 DIAGNOSIS — M6281 Muscle weakness (generalized): Secondary | ICD-10-CM | POA: Diagnosis not present

## 2019-07-10 DIAGNOSIS — R079 Chest pain, unspecified: Secondary | ICD-10-CM | POA: Diagnosis not present

## 2019-07-10 DIAGNOSIS — R0902 Hypoxemia: Secondary | ICD-10-CM | POA: Diagnosis not present

## 2019-07-10 DIAGNOSIS — R404 Transient alteration of awareness: Secondary | ICD-10-CM | POA: Diagnosis not present

## 2019-07-10 DIAGNOSIS — R296 Repeated falls: Secondary | ICD-10-CM | POA: Diagnosis not present

## 2019-07-10 LAB — BASIC METABOLIC PANEL
Anion gap: 7 (ref 5–15)
BUN: 15 mg/dL (ref 8–23)
CO2: 18 mmol/L — ABNORMAL LOW (ref 22–32)
Calcium: 6.8 mg/dL — ABNORMAL LOW (ref 8.9–10.3)
Chloride: 115 mmol/L — ABNORMAL HIGH (ref 98–111)
Creatinine, Ser: 1.09 mg/dL — ABNORMAL HIGH (ref 0.44–1.00)
GFR calc Af Amer: 58 mL/min — ABNORMAL LOW (ref >60)
GFR calc non Af Amer: 50 mL/min — ABNORMAL LOW (ref >60)
Glucose, Bld: 71 mg/dL (ref 70–99)
Potassium: 3.2 mmol/L — ABNORMAL LOW (ref 3.5–5.1)
Sodium: 140 mmol/L (ref 135–145)

## 2019-07-10 LAB — CBC WITH DIFFERENTIAL/PLATELET
Abs Immature Granulocytes: 0.05 10*3/uL (ref 0.00–0.07)
Basophils Absolute: 0.1 10*3/uL (ref 0.0–0.1)
Basophils Relative: 1 %
Eosinophils Absolute: 0.2 10*3/uL (ref 0.0–0.5)
Eosinophils Relative: 2 %
HCT: 41.5 % (ref 36.0–46.0)
Hemoglobin: 12.5 g/dL (ref 12.0–15.0)
Immature Granulocytes: 1 %
Lymphocytes Relative: 16 %
Lymphs Abs: 1.7 10*3/uL (ref 0.7–4.0)
MCH: 29.2 pg (ref 26.0–34.0)
MCHC: 30.1 g/dL (ref 30.0–36.0)
MCV: 97 fL (ref 80.0–100.0)
Monocytes Absolute: 1 10*3/uL (ref 0.1–1.0)
Monocytes Relative: 10 %
Neutro Abs: 7.5 10*3/uL (ref 1.7–7.7)
Neutrophils Relative %: 70 %
Platelets: 237 10*3/uL (ref 150–400)
RBC: 4.28 MIL/uL (ref 3.87–5.11)
RDW: 15.1 % (ref 11.5–15.5)
WBC: 10.6 10*3/uL — ABNORMAL HIGH (ref 4.0–10.5)
nRBC: 0 % (ref 0.0–0.2)

## 2019-07-10 LAB — COMPREHENSIVE METABOLIC PANEL
ALT: <5 U/L (ref 0–44)
AST: 18 U/L (ref 15–41)
Albumin: 2.2 g/dL — ABNORMAL LOW (ref 3.5–5.0)
Alkaline Phosphatase: 38 U/L (ref 38–126)
Anion gap: 7 (ref 5–15)
BUN: 14 mg/dL (ref 8–23)
CO2: 17 mmol/L — ABNORMAL LOW (ref 22–32)
Calcium: 6.2 mg/dL — CL (ref 8.9–10.3)
Chloride: 120 mmol/L — ABNORMAL HIGH (ref 98–111)
Creatinine, Ser: 0.99 mg/dL (ref 0.44–1.00)
GFR calc Af Amer: >60 mL/min (ref >60)
GFR calc non Af Amer: 56 mL/min — ABNORMAL LOW (ref >60)
Glucose, Bld: 64 mg/dL — ABNORMAL LOW (ref 70–99)
Potassium: 3.9 mmol/L (ref 3.5–5.1)
Sodium: 144 mmol/L (ref 135–145)
Total Bilirubin: 0.4 mg/dL (ref 0.3–1.2)
Total Protein: 3.7 g/dL — ABNORMAL LOW (ref 6.5–8.1)

## 2019-07-10 LAB — CBG MONITORING, ED: Glucose-Capillary: 86 mg/dL (ref 70–99)

## 2019-07-10 LAB — TROPONIN I (HIGH SENSITIVITY)
Troponin I (High Sensitivity): <2 ng/L (ref <18)
Troponin I (High Sensitivity): 2 ng/L (ref <18)

## 2019-07-10 LAB — LIPASE, BLOOD: Lipase: 30 U/L (ref 11–51)

## 2019-07-10 MED ORDER — ACETAMINOPHEN 325 MG PO TABS
650.0000 mg | ORAL_TABLET | Freq: Once | ORAL | Status: AC
  Administered 2019-07-10: 650 mg via ORAL
  Filled 2019-07-10: qty 2

## 2019-07-10 MED ORDER — SODIUM CHLORIDE 0.9 % IV BOLUS
500.0000 mL | Freq: Once | INTRAVENOUS | Status: AC
  Administered 2019-07-10: 500 mL via INTRAVENOUS

## 2019-07-10 MED ORDER — IOHEXOL 350 MG/ML SOLN
100.0000 mL | Freq: Once | INTRAVENOUS | Status: AC | PRN
  Administered 2019-07-10: 100 mL via INTRAVENOUS

## 2019-07-10 MED ORDER — ACETAMINOPHEN 325 MG PO TABS
650.0000 mg | ORAL_TABLET | Freq: Once | ORAL | Status: DC
  Filled 2019-07-10: qty 2

## 2019-07-10 MED ORDER — ALUM & MAG HYDROXIDE-SIMETH 200-200-20 MG/5ML PO SUSP
30.0000 mL | Freq: Once | ORAL | Status: AC
  Administered 2019-07-10: 30 mL via ORAL
  Filled 2019-07-10: qty 30

## 2019-07-10 NOTE — ED Triage Notes (Addendum)
Patient arrives via ems from [facility] due to having chest pain x1 week and increased lethargy. Per ems, the patient has a history of dementia but has become more lethargic. Patient also c/o of abd pain and nausea at times.   Patient arrives rating her chest pain a 5/10. Given 324mg  of Asprin En route

## 2019-07-10 NOTE — Discharge Instructions (Addendum)
Your work-up today did not show an acute cause of your discomfort and your CT scan did not show evidence of aortic problems such as dissection or aneurysm.  It did show some calcifications as we discussed.  Your calcium was improving during her ED stay but was still slightly low.  Please increase calcium in your diet and follow-up with your primary doctor in the neck several days for reassessment.  Please rest and stay hydrated.  Given your improvement in symptoms and your desire to go home, we feel you are safe for discharge home.  Please return if any symptoms change or worsen.

## 2019-07-10 NOTE — ED Provider Notes (Signed)
4:35 PM Care center Dr. Ralene Bathe.  At time of transfer of care, patient is awaiting the results of her repeat BMP to assess for electrolyte imbalance as well as a CTA dissection study.  Plan of care will be to reassess patient to determine disposition.  9:20 PM CT.patient was reassessed and she was feeling somewhat better.  Still having some discomfort but she would like to go home.  Given otherwise reassuring work-up today, and patient desired to go home, patient was felt appropriate for follow-up with PCP and close return precautions.  Patient's vital signs is reassuring and blood pressure is 135 on my reassessment.  Patient be discharged home for outpatient follow-up.   Clinical Impression: 1. Generalized abdominal pain   2. Atypical chest pain   3. Hypocalcemia     Disposition: Discharge  Condition: Good  I have discussed the results, Dx and Tx plan with the pt(& family if present). He/she/they expressed understanding and agree(s) with the plan. Discharge instructions discussed at great length. Strict return precautions discussed and pt &/or family have verbalized understanding of the instructions. No further questions at time of discharge.    New Prescriptions   No medications on file    Follow Up: Kristen Loader, [place] [id number] [place] [address] [id number] mc [place]       Lagena Strand, Gwenyth Allegra, MD 07/10/19 2126

## 2019-07-10 NOTE — ED Provider Notes (Signed)
[facility]ie [facility] Provider Note   CSN: [id number] Arrival date & time: 07/10/19  1336     History Chief Complaint  Patient presents with  . Chest Pain  . Abdominal Pain    Jamie George is a 74 y.o. female.  The history is provided by the patient and medical records. No language interpreter was used.  Chest Pain Associated symptoms: abdominal pain   Abdominal Pain Associated symptoms: chest pain     Jamie George is a 74 y.o. female who presents to the Emergency [facility] complaining of chest pain. She presents the emergency [facility] from [facility] for evaluation of sharp, stabbing chest pain that radiates to her back. Pain began about a week and a half ago. She has associated shortness of breath. She complains of occasional abdominal discomfort, none currently. She denies any fevers, cough, nausea, vomiting, diarrhea, dysuria.    Past Medical History:  Diagnosis Date  . Cancer ([place])    skin  . Complication of anesthesia    " difficult to wake me up "  . Depression   . Duodenitis   . GERD (gastroesophageal reflux disease)   . Insomnia   . Migraines   . Mild dementia [facility])     Patient Active Problem List   Diagnosis Date Noted  . Dementia ([place]) 01/02/2018  . Aggression 01/02/2018  . Dissection of abdominal aorta ([place]) 08/13/2017  . Chest pain 08/13/2017  . Hypertension 08/13/2017  . CKD (chronic kidney disease) 08/13/2017  . Anemia 08/13/2017    Past Surgical History:  Procedure Laterality Date  . ABDOMINAL HYSTERECTOMY    . BACK SURGERY    . BIOPSY  03/04/2018   Procedure: BIOPSY;  Surgeon: Otis Brace, MD;  Location: [facility];  Service: Gastroenterology;;  . BRAIN SURGERY  2013   meningioma  . ENTEROSCOPY N/A 03/04/2018   Procedure: PUSH ENTEROSCOPY;  Surgeon: Otis Brace, MD;  Location: [facility];  Service: Gastroenterology;  Laterality: N/A;  . SHOULDER SURGERY       OB History    No obstetric history on file.     Family History  Problem Relation Age of Onset  . Cancer Mother   . Stroke Father   . Heart disease Father     Social History   Tobacco Use  . Smoking status: Former Research scientist (life sciences)  . Smokeless tobacco: Former Network engineer Use Topics  . Alcohol use: Not Currently  . Drug use: Never    Home Medications Prior to Admission medications   Medication Sig Start Date End Date Taking? Authorizing Provider  acetaminophen (TYLENOL 8 HOUR) 650 MG CR tablet Take 1 tablet (650 mg total) by mouth every 8 (eight) hours as needed for pain. Patient not taking: Reported on 02/03/2019 03/31/18   Petrucelli, Glynda Jaeger, PA-C  acetaminophen (TYLENOL) 325 MG tablet Take 650 mg by mouth in the morning and at bedtime.     [provider]  aspirin EC 81 MG EC tablet Take 1 tablet (81 mg total) by mouth daily. With food 08/15/17   Roxan Hockey, MD  atorvastatin (LIPITOR) 40 MG tablet Take 40 mg by mouth daily.     [provider]  docusate sodium (COLACE) 100 MG capsule Take 100 mg by mouth daily.    [provider]  donepezil (ARICEPT) 10 MG tablet Take 10 mg by mouth at bedtime.    [provider]  escitalopram (LEXAPRO) 20 MG tablet Take 20 mg by mouth daily.  05/03/17   [provider]  ferrous sulfate 325 (65 FE) MG tablet Take 325 mg by mouth 2 (two) times daily with a meal.    [provider]  fluticasone (FLONASE) 50 MCG/ACT nasal spray Place 1 spray into both nostrils daily.    [provider]  HYDROcodone-acetaminophen (NORCO/VICODIN) 5-325 MG tablet Take 1 tablet by mouth every 6 (six) hours as needed for moderate pain. Patient not taking: Reported on 07/04/2019 02/03/19   Milton Ferguson, MD  melatonin 5 MG TABS Take 10 mg by mouth at bedtime.    [provider]  memantine (NAMENDA) 10 MG tablet Take 1 tablet (10 mg total) by mouth 2 (two) times daily. 12/17/17   Penumalli, Earlean Polka, MD   mirtazapine (REMERON) 7.5 MG tablet Take 7.5 mg by mouth at bedtime.    [provider]  ondansetron (ZOFRAN) 4 MG tablet Take 1 tablet (4 mg total) by mouth every 6 (six) hours as needed for nausea. Patient not taking: Reported on 02/03/2019 08/14/17   Roxan Hockey, MD  pantoprazole (PROTONIX) 40 MG tablet Take 40 mg by mouth daily.     [provider]  polyethylene glycol (MIRALAX / GLYCOLAX) packet Take 17 g by mouth daily.    [provider]  risperiDONE (RISPERDAL) 0.5 MG tablet Take 1 mg by mouth at bedtime.     [provider]  traZODone (DESYREL) 50 MG tablet Take 50 mg by mouth at bedtime.  05/03/17   [provider]  verapamil (CALAN-SR) 120 MG CR tablet Take 120 mg by mouth daily.    [provider]  zonisamide (ZONEGRAN) 100 MG capsule Take 200 mg by mouth at bedtime.     [provider]    Allergies    Patient has no known allergies.  Review of Systems   Review of Systems  Cardiovascular: Positive for chest pain.  Gastrointestinal: Positive for abdominal pain.  All other systems reviewed and are negative.   Physical Exam Updated Vital Signs BP (!) 151/85 (BP Location: Right Arm)   Pulse 64   Temp 97.8 F (36.6 C) (Oral)   Resp 13   Ht 5\' 7"  (1.702 m)   Wt 72 kg   SpO2 97%   BMI 24.86 kg/m   Physical Exam Vitals and nursing note reviewed.  Constitutional:      Appearance: She is well-developed.  HENT:     Head: Normocephalic and atraumatic.  Cardiovascular:     Rate and Rhythm: Normal rate and regular rhythm.     Heart sounds: No murmur.  Pulmonary:     Effort: Pulmonary effort is normal. No respiratory distress.     Breath sounds: Normal breath sounds.  Chest:     Chest wall: No tenderness.  Abdominal:     Palpations: Abdomen is soft.     Tenderness: There is no abdominal tenderness. There is no guarding or rebound.  Musculoskeletal:        General: No tenderness.  Skin:    General:  Skin is warm and dry.  Neurological:     Mental Status: She is alert and oriented to person, place, and time.  Psychiatric:        Behavior: Behavior normal.     ED Results / Procedures / Treatments   Labs (all labs ordered are listed, but only abnormal results are displayed) Labs Reviewed  COMPREHENSIVE METABOLIC PANEL - Abnormal; Notable for the following components:      Result Value  Chloride 120 (*)    CO2 17 (*)    Glucose, Bld 64 (*)    Calcium 6.2 (*)    Total Protein 3.7 (*)    Albumin 2.2 (*)    GFR calc non Af Amer 56 (*)    All other components within normal limits  CBC WITH DIFFERENTIAL/PLATELET - Abnormal; Notable for the following components:   WBC 10.6 (*)    All other components within normal limits  LIPASE, BLOOD  BASIC METABOLIC PANEL  CBG MONITORING, ED  TROPONIN I (HIGH SENSITIVITY)  TROPONIN I (HIGH SENSITIVITY)    EKG EKG Interpretation  Date/Time:  Thursday Jul 10 2019 13:43:13 EDT Ventricular Rate:  66 PR Interval:    QRS Duration: 99 QT Interval:  517 QTC Calculation: 542 R Axis:   78 Text Interpretation: Sinus rhythm Borderline T abnormalities, anterior leads Prolonged QT interval Confirmed by Quintella Reichert [id number]) on 07/10/2019 1:49:45 PM   Radiology DG Chest Port 1 View  Result Date: 07/10/2019 CLINICAL DATA:  Chest pain. EXAM: PORTABLE CHEST 1 VIEW COMPARISON:  07/04/2019 FINDINGS: Lungs are adequately inflated without focal airspace consolidation or effusion. Cardiomediastinal silhouette and remainder of the exam is unchanged. IMPRESSION: No active disease. Electronically Signed   By: Marin Olp M.D.   On: 07/10/2019 14:51    Procedures Procedures (including critical care time)  Medications Ordered in ED Medications  acetaminophen (TYLENOL) tablet 650 mg (650 mg Oral Given 07/10/19 1446)  alum & mag hydroxide-simeth (MAALOX/MYLANTA) 200-200-20 MG/5ML suspension 30 mL (30 mLs Oral Given 07/10/19 1446)  sodium chloride 0.9 %  bolus 500 mL (500 mLs Intravenous New Bag/Given 07/10/19 1612)    ED Course  I have reviewed the triage vital signs and the nursing notes.  Pertinent labs & imaging results that were available during my care of the patient were reviewed by me and considered in my medical decision making (see chart for details).    MDM Rules/Calculators/A&P                     Patient here for evaluation of a week of stabbing chest pain that radiates to her back. She is non-toxic appearing on evaluation with no respiratory distress. EKG with nonspecific changes. Given nature of symptoms, will obtain CTA to rule out dissection. Patient care transferred pending CT imaging.  Final Clinical Impression(s) / ED Diagnoses Final diagnoses:  None    Rx / DC Orders ED Discharge Orders    None       Quintella Reichert, MD 07/10/19 [phone]

## 2019-07-10 NOTE — ED Notes (Signed)
ptar called by dee pt is number 7 on the list 

## 2019-07-11 DIAGNOSIS — I10 Essential (primary) hypertension: Secondary | ICD-10-CM | POA: Diagnosis not present

## 2019-07-11 DIAGNOSIS — R52 Pain, unspecified: Secondary | ICD-10-CM | POA: Diagnosis not present

## 2019-07-11 DIAGNOSIS — R296 Repeated falls: Secondary | ICD-10-CM | POA: Diagnosis not present

## 2019-07-11 DIAGNOSIS — M6281 Muscle weakness (generalized): Secondary | ICD-10-CM | POA: Diagnosis not present

## 2019-07-11 DIAGNOSIS — R2681 Unsteadiness on feet: Secondary | ICD-10-CM | POA: Diagnosis not present

## 2019-07-11 DIAGNOSIS — M2550 Pain in unspecified joint: Secondary | ICD-10-CM | POA: Diagnosis not present

## 2019-07-11 DIAGNOSIS — Z7401 Bed confinement status: Secondary | ICD-10-CM | POA: Diagnosis not present

## 2019-07-11 DIAGNOSIS — R488 Other symbolic dysfunctions: Secondary | ICD-10-CM | POA: Diagnosis not present

## 2019-07-16 DIAGNOSIS — M6281 Muscle weakness (generalized): Secondary | ICD-10-CM | POA: Diagnosis not present

## 2019-07-16 DIAGNOSIS — R296 Repeated falls: Secondary | ICD-10-CM | POA: Diagnosis not present

## 2019-07-16 DIAGNOSIS — R488 Other symbolic dysfunctions: Secondary | ICD-10-CM | POA: Diagnosis not present

## 2019-07-16 DIAGNOSIS — R2681 Unsteadiness on feet: Secondary | ICD-10-CM | POA: Diagnosis not present

## 2019-07-23 DIAGNOSIS — R2681 Unsteadiness on feet: Secondary | ICD-10-CM | POA: Diagnosis not present

## 2019-07-23 DIAGNOSIS — R296 Repeated falls: Secondary | ICD-10-CM | POA: Diagnosis not present

## 2019-07-23 DIAGNOSIS — M6281 Muscle weakness (generalized): Secondary | ICD-10-CM | POA: Diagnosis not present

## 2019-07-26 ENCOUNTER — Other Ambulatory Visit: Payer: Self-pay

## 2019-07-26 ENCOUNTER — Emergency Department (HOSPITAL_COMMUNITY): Payer: Medicare PPO

## 2019-07-26 ENCOUNTER — Encounter (HOSPITAL_COMMUNITY): Payer: Self-pay

## 2019-07-26 ENCOUNTER — Emergency Department (HOSPITAL_COMMUNITY)
Admission: EM | Admit: 2019-07-26 | Discharge: 2019-07-26 | Disposition: A | Discharge status: Home / Self Care | Payer: Medicare PPO | Attending: Emergency Medicine | Admitting: Emergency Medicine

## 2019-07-26 DIAGNOSIS — J90 Pleural effusion, not elsewhere classified: Secondary | ICD-10-CM | POA: Diagnosis not present

## 2019-07-26 DIAGNOSIS — R079 Chest pain, unspecified: Secondary | ICD-10-CM | POA: Diagnosis not present

## 2019-07-26 DIAGNOSIS — J9811 Atelectasis: Secondary | ICD-10-CM | POA: Diagnosis not present

## 2019-07-26 DIAGNOSIS — R279 Unspecified lack of coordination: Secondary | ICD-10-CM | POA: Diagnosis not present

## 2019-07-26 DIAGNOSIS — Z87891 Personal history of nicotine dependence: Secondary | ICD-10-CM | POA: Diagnosis not present

## 2019-07-26 DIAGNOSIS — R0902 Hypoxemia: Secondary | ICD-10-CM | POA: Diagnosis not present

## 2019-07-26 DIAGNOSIS — Z743 Need for continuous supervision: Secondary | ICD-10-CM | POA: Diagnosis not present

## 2019-07-26 DIAGNOSIS — R0789 Other chest pain: Secondary | ICD-10-CM | POA: Diagnosis not present

## 2019-07-26 DIAGNOSIS — R0602 Shortness of breath: Secondary | ICD-10-CM | POA: Diagnosis not present

## 2019-07-26 DIAGNOSIS — R05 Cough: Secondary | ICD-10-CM | POA: Diagnosis not present

## 2019-07-26 DIAGNOSIS — R509 Fever, unspecified: Secondary | ICD-10-CM | POA: Diagnosis not present

## 2019-07-26 DIAGNOSIS — R0600 Dyspnea, unspecified: Secondary | ICD-10-CM | POA: Diagnosis present

## 2019-07-26 DIAGNOSIS — R457 State of emotional shock and stress, unspecified: Secondary | ICD-10-CM | POA: Diagnosis not present

## 2019-07-26 LAB — COMPREHENSIVE METABOLIC PANEL
ALT: 11 U/L (ref 0–44)
AST: 15 U/L (ref 15–41)
Albumin: 3.3 g/dL — ABNORMAL LOW (ref 3.5–5.0)
Alkaline Phosphatase: 78 U/L (ref 38–126)
Anion gap: 14 (ref 5–15)
BUN: 10 mg/dL (ref 8–23)
CO2: 24 mmol/L (ref 22–32)
Calcium: 9.3 mg/dL (ref 8.9–10.3)
Chloride: 104 mmol/L (ref 98–111)
Creatinine, Ser: 1.44 mg/dL — ABNORMAL HIGH (ref 0.44–1.00)
GFR calc Af Amer: 41 mL/min — ABNORMAL LOW (ref >60)
GFR calc non Af Amer: 35 mL/min — ABNORMAL LOW (ref >60)
Glucose, Bld: 98 mg/dL (ref 70–99)
Potassium: 3.4 mmol/L — ABNORMAL LOW (ref 3.5–5.1)
Sodium: 142 mmol/L (ref 135–145)
Total Bilirubin: 0.7 mg/dL (ref 0.3–1.2)
Total Protein: 6.2 g/dL — ABNORMAL LOW (ref 6.5–8.1)

## 2019-07-26 LAB — CBC WITH DIFFERENTIAL/PLATELET
Abs Immature Granulocytes: 0.11 10*3/uL — ABNORMAL HIGH (ref 0.00–0.07)
Basophils Absolute: 0.1 10*3/uL (ref 0.0–0.1)
Basophils Relative: 1 %
Eosinophils Absolute: 0.4 10*3/uL (ref 0.0–0.5)
Eosinophils Relative: 3 %
HCT: 53.1 % — ABNORMAL HIGH (ref 36.0–46.0)
Hemoglobin: 16.6 g/dL — ABNORMAL HIGH (ref 12.0–15.0)
Immature Granulocytes: 1 %
Lymphocytes Relative: 16 %
Lymphs Abs: 1.8 10*3/uL (ref 0.7–4.0)
MCH: 29.1 pg (ref 26.0–34.0)
MCHC: 31.3 g/dL (ref 30.0–36.0)
MCV: 93.2 fL (ref 80.0–100.0)
Monocytes Absolute: 1.2 10*3/uL — ABNORMAL HIGH (ref 0.1–1.0)
Monocytes Relative: 10 %
Neutro Abs: 8 10*3/uL — ABNORMAL HIGH (ref 1.7–7.7)
Neutrophils Relative %: 69 %
Platelets: 377 10*3/uL (ref 150–400)
RBC: 5.7 MIL/uL — ABNORMAL HIGH (ref 3.87–5.11)
RDW: 15.1 % (ref 11.5–15.5)
WBC: 11.6 10*3/uL — ABNORMAL HIGH (ref 4.0–10.5)
nRBC: 0 % (ref 0.0–0.2)

## 2019-07-26 LAB — URINALYSIS, ROUTINE W REFLEX MICROSCOPIC
Bacteria, UA: NONE SEEN
Glucose, UA: NEGATIVE mg/dL
Hgb urine dipstick: NEGATIVE
Ketones, ur: 5 mg/dL — AB
Leukocytes,Ua: NEGATIVE
Nitrite: NEGATIVE
Protein, ur: 30 mg/dL — AB
Specific Gravity, Urine: 1.031 — ABNORMAL HIGH (ref 1.005–1.030)
pH: 5 (ref 5.0–8.0)

## 2019-07-26 LAB — TROPONIN I (HIGH SENSITIVITY)
Troponin I (High Sensitivity): 5 ng/L (ref <18)
Troponin I (High Sensitivity): 5 ng/L (ref <18)

## 2019-07-26 LAB — D-DIMER, QUANTITATIVE: D-Dimer, Quant: 1.33 ug/mL-FEU — ABNORMAL HIGH (ref 0.00–0.50)

## 2019-07-26 LAB — BRAIN NATRIURETIC PEPTIDE: B Natriuretic Peptide: 30.7 pg/mL (ref 0.0–100.0)

## 2019-07-26 LAB — MAGNESIUM: Magnesium: 2 mg/dL (ref 1.7–2.4)

## 2019-07-26 MED ORDER — SODIUM CHLORIDE 0.9 % IV BOLUS
1000.0000 mL | Freq: Once | INTRAVENOUS | Status: AC
  Administered 2019-07-26: 1000 mL via INTRAVENOUS

## 2019-07-26 MED ORDER — IOHEXOL 350 MG/ML SOLN
60.0000 mL | Freq: Once | INTRAVENOUS | Status: AC | PRN
  Administered 2019-07-26: 60 mL via INTRAVENOUS

## 2019-07-26 MED ORDER — MORPHINE SULFATE (PF) 4 MG/ML IV SOLN
2.0000 mg | Freq: Once | INTRAVENOUS | Status: AC
  Administered 2019-07-26: 2 mg via INTRAVENOUS
  Filled 2019-07-26: qty 1

## 2019-07-26 MED ORDER — SODIUM CHLORIDE 0.9% FLUSH
3.0000 mL | Freq: Once | INTRAVENOUS | Status: AC
  Administered 2019-07-26: 3 mL via INTRAVENOUS

## 2019-07-26 MED ORDER — ALBUTEROL SULFATE HFA 108 (90 BASE) MCG/ACT IN AERS
2.0000 | INHALATION_SPRAY | Freq: Four times a day (QID) | RESPIRATORY_TRACT | Status: DC
  Administered 2019-07-26: 2 via RESPIRATORY_TRACT
  Filled 2019-07-26: qty 6.7

## 2019-07-26 MED ORDER — ACETAMINOPHEN 325 MG PO TABS
650.0000 mg | ORAL_TABLET | Freq: Once | ORAL | Status: AC
  Administered 2019-07-26: 650 mg via ORAL
  Filled 2019-07-26: qty 2

## 2019-07-26 NOTE — ED Notes (Signed)
[facility] called to transport pt 

## 2019-07-26 NOTE — ED Provider Notes (Signed)
[facility] EMERGENCY DEPARTMENT Provider Note   CSN: [id number] Arrival date & time: 07/26/19  0254     History No chief complaint on file.   Jamie George is a 74 y.o. female.  HPI    Patient presents from nursing facility with concern for dyspnea. Patient does have history of dementia, but seems to answer questions about her current situation appropriately, though briefly. She acknowledges having been in the hospital recently, states that now over the past week or so she has had increased difficulty breathing, no pain, either at rest or with exertion.  She states that she feels warm, but has no knowledge of objective fever.  She is unaware of any recent medication changes. She denies other complaints, but with persistent dyspnea was brought here for evaluation.  Arrives via EMS.  Per report the call out was for dyspnea as well as cough, subjective fever.  History also obtained via chart review, as the patient has 5 prior ED visits in the past 6 months. Twice over the past month patient has had CT scan to rule out dissection performed.   Past Medical History:  Diagnosis Date  . Cancer ([place])    skin  . Complication of anesthesia    " difficult to wake me up "  . Depression   . Duodenitis   . GERD (gastroesophageal reflux disease)   . Insomnia   . Migraines   . Mild dementia [facility])     Patient Active Problem List   Diagnosis Date Noted  . Dementia ([place]) 01/02/2018  . Aggression 01/02/2018  . Dissection of abdominal aorta ([place]) 08/13/2017  . Chest pain 08/13/2017  . Hypertension 08/13/2017  . CKD (chronic kidney disease) 08/13/2017  . Anemia 08/13/2017    Past Surgical History:  Procedure Laterality Date  . ABDOMINAL HYSTERECTOMY    . BACK SURGERY    . BIOPSY  03/04/2018   Procedure: BIOPSY;  Surgeon: Otis Brace, MD;  [facility];  Service: Gastroenterology;;  . BRAIN SURGERY  2013   meningioma  . ENTEROSCOPY N/A  03/04/2018   Procedure: PUSH ENTEROSCOPY;  Surgeon: Otis Brace, MD;  [facility];  Service: Gastroenterology;  Laterality: N/A;  . SHOULDER SURGERY       OB History   No obstetric history on file.     Family History  Problem Relation Age of Onset  . Cancer Mother   . Stroke Father   . Heart disease Father     Social History   Tobacco Use  . Smoking status: Former Research scientist (life sciences)  . Smokeless tobacco: Former Network engineer Use Topics  . Alcohol use: Not Currently  . Drug use: Never    Home Medications Prior to Admission medications   Medication Sig Start Date End Date Taking? Authorizing Provider  acetaminophen (TYLENOL 8 HOUR) 650 MG CR tablet Take 1 tablet (650 mg total) by mouth every 8 (eight) hours as needed for pain. Patient not taking: Reported on 02/03/2019 03/31/18   Petrucelli, Glynda Jaeger, PA-C  acetaminophen (TYLENOL) 325 MG tablet Take 650 mg by mouth in the morning and at bedtime.     [provider]  aspirin EC 81 MG EC tablet Take 1 tablet (81 mg total) by mouth daily. With food 08/15/17   Roxan Hockey, MD  atorvastatin (LIPITOR) 40 MG tablet Take 40 mg by mouth daily.     [provider]  docusate sodium (COLACE) 100 MG capsule Take 100 mg by mouth daily.  [provider]  donepezil (ARICEPT) 10 MG tablet Take 10 mg by mouth at bedtime.    [provider]  escitalopram (LEXAPRO) 20 MG tablet Take 20 mg by mouth daily.  05/03/17   [provider]  ferrous sulfate 325 (65 FE) MG tablet Take 325 mg by mouth 2 (two) times daily with a meal.    [provider]  fluticasone (FLONASE) 50 MCG/ACT nasal spray Place 1 spray into both nostrils daily.    [provider]  HYDROcodone-acetaminophen (NORCO/VICODIN) 5-325 MG tablet Take 1 tablet by mouth every 6 (six) hours as needed for moderate pain. Patient not taking: Reported on 07/04/2019 02/03/19   Milton Ferguson, MD  melatonin 5 MG TABS Take 10  mg by mouth at bedtime.    [provider]  memantine (NAMENDA) 10 MG tablet Take 1 tablet (10 mg total) by mouth 2 (two) times daily. 12/17/17   Penumalli, Earlean Polka, MD  mirtazapine (REMERON) 7.5 MG tablet Take 7.5 mg by mouth at bedtime.    [provider]  ondansetron (ZOFRAN) 4 MG tablet Take 1 tablet (4 mg total) by mouth every 6 (six) hours as needed for nausea. Patient not taking: Reported on 02/03/2019 08/14/17   Roxan Hockey, MD  pantoprazole (PROTONIX) 40 MG tablet Take 40 mg by mouth daily.     [provider]  polyethylene glycol (MIRALAX / GLYCOLAX) packet Take 17 g by mouth daily.    [provider]  risperiDONE (RISPERDAL) 0.5 MG tablet Take 1 mg by mouth at bedtime.     [provider]  traZODone (DESYREL) 50 MG tablet Take 50 mg by mouth at bedtime.  05/03/17   [provider]  verapamil (CALAN-SR) 120 MG CR tablet Take 120 mg by mouth daily.    [provider]  zonisamide (ZONEGRAN) 100 MG capsule Take 200 mg by mouth at bedtime.     [provider]    Allergies    Patient has no known allergies.  Review of Systems   Review of Systems  Constitutional:       Per HPI, otherwise negative  HENT:       Per HPI, otherwise negative  Respiratory:       Per HPI, otherwise negative  Cardiovascular:       Per HPI, otherwise negative  Gastrointestinal: Negative for vomiting.  Endocrine:       Negative aside from HPI  Genitourinary:       Neg aside from HPI   Musculoskeletal:       Per HPI, otherwise negative  Skin: Negative.   Neurological: Negative for syncope.    Physical Exam Updated Vital Signs BP 111/61   Pulse 69   Temp 98 F (36.7 C) (Oral)   Resp 18   SpO2 96%   Physical Exam Vitals and nursing note reviewed.  Constitutional:      General: She is not in acute distress.    Appearance: She is well-developed. She is not toxic-appearing.  HENT:     Head: Normocephalic and  atraumatic.  Eyes:     Conjunctiva/sclera: Conjunctivae normal.  Cardiovascular:     Rate and Rhythm: Normal rate and regular rhythm.  Pulmonary:     Effort: Pulmonary effort is normal. No respiratory distress.     Breath sounds: Normal breath sounds. No stridor.     Comments: Clear breath sounds, with 2 L via nasal cannula saturation is 95%. Abdominal:     General: There  is no distension.  Skin:    General: Skin is warm and dry.  Neurological:     Mental Status: She is alert.     Cranial Nerves: Cranial nerves are intact. No cranial nerve deficit.     Motor: Atrophy present.  Psychiatric:        Behavior: Behavior is slowed and withdrawn.     ED Results / Procedures / Treatments   Labs (all labs ordered are listed, but only abnormal results are displayed) Labs Reviewed  COMPREHENSIVE METABOLIC PANEL - Abnormal; Notable for the following components:      Result Value   Potassium 3.4 (*)    Creatinine, Ser 1.44 (*)    Total Protein 6.2 (*)    Albumin 3.3 (*)    GFR calc non Af Amer 35 (*)    GFR calc Af Amer 41 (*)    All other components within normal limits  CBC WITH DIFFERENTIAL/PLATELET - Abnormal; Notable for the following components:   WBC 11.6 (*)    RBC 5.70 (*)    Hemoglobin 16.6 (*)    HCT 53.1 (*)    Neutro Abs 8.0 (*)    Monocytes Absolute 1.2 (*)    Abs Immature Granulocytes 0.11 (*)    All other components within normal limits  URINALYSIS, ROUTINE W REFLEX MICROSCOPIC - Abnormal; Notable for the following components:   Specific Gravity, Urine 1.031 (*)    Bilirubin Urine MODERATE (*)    Ketones, ur 5 (*)    Protein, ur 30 (*)    All other components within normal limits  D-DIMER, QUANTITATIVE (NOT AT [facility]) - Abnormal; Notable for the following components:   D-Dimer, Quant 1.33 (*)    All other components within normal limits  BRAIN NATRIURETIC PEPTIDE  MAGNESIUM  TROPONIN I (HIGH SENSITIVITY)  TROPONIN I (HIGH SENSITIVITY)     EKG None  Radiology DG Chest 2 View  Result Date: 07/26/2019 CLINICAL DATA:  Cough. Arrived by [facility] for increased shortness of breath and cough. Reported fever. EXAM: CHEST - 2 VIEW COMPARISON:  07/10/2019 FINDINGS: Heart size is normal. The lungs are free of focal consolidations. There is minimal subsegmental atelectasis at the lung bases. Trace pleural effusion seen at the posterior costophrenic angle, likely on the RIGHT side. IMPRESSION: Bibasilar atelectasis and trace RIGHT pleural effusion. Electronically Signed   By: Nolon Nations M.D.   On: 07/26/2019 10:46   CT Angio Chest PE W/Cm &/Or Wo Cm  Result Date: 07/26/2019 CLINICAL DATA:  Cough, shortness of breath EXAM: CT ANGIOGRAPHY CHEST WITH CONTRAST TECHNIQUE: Multidetector CT imaging of the chest was performed using the standard protocol during bolus administration of intravenous contrast. Multiplanar CT image reconstructions and MIPs were obtained to evaluate the vascular anatomy. CONTRAST:  37mL OMNIPAQUE IOHEXOL 350 MG/ML SOLN COMPARISON:  CT chest angiogram, 07/10/2019, 08/13/2017 FINDINGS: Cardiovascular: Satisfactory opacification of the pulmonary arteries to the segmental level. No evidence of pulmonary embolism. Normal heart size. No pericardial effusion. Aortic atherosclerosis. Mediastinum/Nodes: No enlarged mediastinal, hilar, or axillary lymph nodes. Thyroid gland, trachea, and esophagus demonstrate no significant findings. Lungs/Pleura: 4 mm pulmonary nodule of the peripheral right upper lobe (series 6, image 63). Bandlike scarring and or atelectasis of the bilateral lung bases. Clustered nodularity of the lingula, unchanged, measuring up to 8 mm (series 6, image 79). Nodularity is stable or reduced compared to prior examinations and consistent with definitively benign sequelae of infection or inflammation. No pleural effusion or pneumothorax. Upper Abdomen: No acute  abnormality. Musculoskeletal: No chest wall abnormality. No  acute or significant osseous findings. Review of the MIP images confirms the above findings. IMPRESSION: 1. Negative examination for pulmonary embolism. 2. Aortic Atherosclerosis (ICD10-I70.0). Electronically Signed   By: Eddie Candle M.D.   On: 07/26/2019 14:58    Procedures Procedures (including critical care time)  Medications Ordered in ED Medications  albuterol (VENTOLIN HFA) 108 (90 Base) MCG/ACT inhaler 2 puff (has no administration in time range)  morphine 4 MG/ML injection 2 mg (has no administration in time range)  sodium chloride flush (NS) 0.9 % injection 3 mL (3 mLs Intravenous Given 07/26/19 1235)  sodium chloride 0.9 % bolus 1,000 mL (0 mLs Intravenous Stopped 07/26/19 1434)  acetaminophen (TYLENOL) tablet 650 mg (650 mg Oral Given 07/26/19 1433)  iohexol (OMNIPAQUE) 350 MG/ML injection 60 mL (60 mLs Intravenous Contrast Given 07/26/19 1409)    ED Course  I have reviewed the triage vital signs and the nursing notes.  Pertinent labs & imaging results that were available during my care of the patient were reviewed by me and considered in my medical decision making (see chart for details).    MDM Rules/Calculators/A&P                      5:52 PM Patient awake, alert.  She states that she feels like she is having a hard time taking breaths in and expelling them.  Now, not on supplemental oxygen the patient has saturation of 99%, has no tachypnea, breath sounds are clear, there are some suspicion for patient's anxiety contributing to this continued complaint.  She has received albuterol, fluids, Tylenol.  Given some suspicion for air hunger and anxiety she will receive a low-dose morphine, she is already on home opiates, lower suspicion for inducing bradypnea. Labs thus far reviewed, x-ray, CT reviewed, notable for no pneumonia, possible small effusion, though this is not demonstrated on CT scan, there is no evidence for pulmonary embolism either. Absent fever, hypoxia, increased work  of breathing, low suspicion for acute new pathology, some suspicion for acute on chronic processes including anxiety.  Should the patient remain well she will be appropriate to return to her nursing facility. Final Clinical Impression(s) / ED Diagnoses Final diagnoses:  SOB (shortness of breath)     Carmin Muskrat, MD 07/26/19 1754

## 2019-07-26 NOTE — ED Triage Notes (Signed)
Patient arrived by [facility] for increased SOB, cough, reported fever. States that it hurts her to breath, alert and oriented.

## 2019-07-26 NOTE — Discharge Instructions (Signed)
As discussed, your evaluation today has been largely reassuring.  But, it is important that you monitor your condition carefully, and do not hesitate to return to the ED if you develop new, or concerning changes in your condition. ? ?Otherwise, please follow-up with your physician for appropriate ongoing care. ? ?

## 2019-07-26 NOTE — ED Notes (Signed)
[facility] called by this nurse and updated that patient will be discharged back to facility.

## 2019-07-30 ENCOUNTER — Emergency Department (HOSPITAL_COMMUNITY): Payer: Medicare PPO

## 2019-07-30 ENCOUNTER — Encounter (HOSPITAL_COMMUNITY): Payer: Self-pay | Admitting: Emergency Medicine

## 2019-07-30 ENCOUNTER — Other Ambulatory Visit: Payer: Self-pay

## 2019-07-30 ENCOUNTER — Observation Stay (HOSPITAL_COMMUNITY)
Admission: EM | Admit: 2019-07-30 | Discharge: 2019-08-04 | Disposition: A | Discharge status: Skilled Nursing Facility | Payer: Medicare PPO | Attending: Family Medicine | Admitting: Family Medicine

## 2019-07-30 DIAGNOSIS — Z7982 Long term (current) use of aspirin: Secondary | ICD-10-CM | POA: Insufficient documentation

## 2019-07-30 DIAGNOSIS — K219 Gastro-esophageal reflux disease without esophagitis: Secondary | ICD-10-CM | POA: Diagnosis not present

## 2019-07-30 DIAGNOSIS — F0390 Unspecified dementia without behavioral disturbance: Secondary | ICD-10-CM | POA: Diagnosis present

## 2019-07-30 DIAGNOSIS — Z87891 Personal history of nicotine dependence: Secondary | ICD-10-CM | POA: Diagnosis not present

## 2019-07-30 DIAGNOSIS — Z20822 Contact with and (suspected) exposure to covid-19: Secondary | ICD-10-CM | POA: Diagnosis not present

## 2019-07-30 DIAGNOSIS — R0602 Shortness of breath: Secondary | ICD-10-CM | POA: Diagnosis not present

## 2019-07-30 DIAGNOSIS — E785 Hyperlipidemia, unspecified: Secondary | ICD-10-CM | POA: Diagnosis not present

## 2019-07-30 DIAGNOSIS — I10 Essential (primary) hypertension: Secondary | ICD-10-CM | POA: Diagnosis present

## 2019-07-30 DIAGNOSIS — R079 Chest pain, unspecified: Secondary | ICD-10-CM | POA: Diagnosis present

## 2019-07-30 DIAGNOSIS — N189 Chronic kidney disease, unspecified: Secondary | ICD-10-CM | POA: Diagnosis present

## 2019-07-30 DIAGNOSIS — J90 Pleural effusion, not elsewhere classified: Secondary | ICD-10-CM | POA: Insufficient documentation

## 2019-07-30 DIAGNOSIS — Z79899 Other long term (current) drug therapy: Secondary | ICD-10-CM | POA: Insufficient documentation

## 2019-07-30 DIAGNOSIS — F0391 Unspecified dementia with behavioral disturbance: Secondary | ICD-10-CM | POA: Diagnosis not present

## 2019-07-30 DIAGNOSIS — N1832 Chronic kidney disease, stage 3b: Secondary | ICD-10-CM | POA: Diagnosis present

## 2019-07-30 DIAGNOSIS — E876 Hypokalemia: Secondary | ICD-10-CM | POA: Diagnosis not present

## 2019-07-30 DIAGNOSIS — I129 Hypertensive chronic kidney disease with stage 1 through stage 4 chronic kidney disease, or unspecified chronic kidney disease: Secondary | ICD-10-CM | POA: Insufficient documentation

## 2019-07-30 DIAGNOSIS — J9601 Acute respiratory failure with hypoxia: Secondary | ICD-10-CM | POA: Insufficient documentation

## 2019-07-30 DIAGNOSIS — R072 Precordial pain: Secondary | ICD-10-CM | POA: Diagnosis not present

## 2019-07-30 DIAGNOSIS — I700 Atherosclerosis of aorta: Secondary | ICD-10-CM | POA: Insufficient documentation

## 2019-07-30 DIAGNOSIS — G43909 Migraine, unspecified, not intractable, without status migrainosus: Secondary | ICD-10-CM | POA: Insufficient documentation

## 2019-07-30 DIAGNOSIS — R0902 Hypoxemia: Secondary | ICD-10-CM | POA: Diagnosis not present

## 2019-07-30 DIAGNOSIS — G4700 Insomnia, unspecified: Secondary | ICD-10-CM | POA: Insufficient documentation

## 2019-07-30 DIAGNOSIS — D649 Anemia, unspecified: Secondary | ICD-10-CM | POA: Diagnosis not present

## 2019-07-30 DIAGNOSIS — R0600 Dyspnea, unspecified: Secondary | ICD-10-CM | POA: Diagnosis present

## 2019-07-30 DIAGNOSIS — M47816 Spondylosis without myelopathy or radiculopathy, lumbar region: Secondary | ICD-10-CM | POA: Insufficient documentation

## 2019-07-30 DIAGNOSIS — F329 Major depressive disorder, single episode, unspecified: Secondary | ICD-10-CM | POA: Diagnosis not present

## 2019-07-30 DIAGNOSIS — R519 Headache, unspecified: Secondary | ICD-10-CM | POA: Diagnosis not present

## 2019-07-30 DIAGNOSIS — K5730 Diverticulosis of large intestine without perforation or abscess without bleeding: Secondary | ICD-10-CM | POA: Diagnosis not present

## 2019-07-30 LAB — CBC WITH DIFFERENTIAL/PLATELET
Abs Immature Granulocytes: 0.05 10*3/uL (ref 0.00–0.07)
Basophils Absolute: 0.2 10*3/uL — ABNORMAL HIGH (ref 0.0–0.1)
Basophils Relative: 2 %
Eosinophils Absolute: 0.5 10*3/uL (ref 0.0–0.5)
Eosinophils Relative: 5 %
HCT: 48.7 % — ABNORMAL HIGH (ref 36.0–46.0)
Hemoglobin: 15.6 g/dL — ABNORMAL HIGH (ref 12.0–15.0)
Immature Granulocytes: 1 %
Lymphocytes Relative: 21 %
Lymphs Abs: 2.2 10*3/uL (ref 0.7–4.0)
MCH: 29.6 pg (ref 26.0–34.0)
MCHC: 32 g/dL (ref 30.0–36.0)
MCV: 92.4 fL (ref 80.0–100.0)
Monocytes Absolute: 1.1 10*3/uL — ABNORMAL HIGH (ref 0.1–1.0)
Monocytes Relative: 11 %
Neutro Abs: 6.5 10*3/uL (ref 1.7–7.7)
Neutrophils Relative %: 60 %
Platelets: 346 10*3/uL (ref 150–400)
RBC: 5.27 MIL/uL — ABNORMAL HIGH (ref 3.87–5.11)
RDW: 15.1 % (ref 11.5–15.5)
WBC: 10.6 10*3/uL — ABNORMAL HIGH (ref 4.0–10.5)
nRBC: 0 % (ref 0.0–0.2)

## 2019-07-30 LAB — BRAIN NATRIURETIC PEPTIDE: B Natriuretic Peptide: 30.5 pg/mL (ref 0.0–100.0)

## 2019-07-30 LAB — BASIC METABOLIC PANEL
Anion gap: 8 (ref 5–15)
BUN: 13 mg/dL (ref 8–23)
CO2: 24 mmol/L (ref 22–32)
Calcium: 9 mg/dL (ref 8.9–10.3)
Chloride: 113 mmol/L — ABNORMAL HIGH (ref 98–111)
Creatinine, Ser: 1.26 mg/dL — ABNORMAL HIGH (ref 0.44–1.00)
GFR calc Af Amer: 48 mL/min — ABNORMAL LOW (ref >60)
GFR calc non Af Amer: 42 mL/min — ABNORMAL LOW (ref >60)
Glucose, Bld: 93 mg/dL (ref 70–99)
Potassium: 3.6 mmol/L (ref 3.5–5.1)
Sodium: 145 mmol/L (ref 135–145)

## 2019-07-30 LAB — SARS CORONAVIRUS 2 BY RT PCR (HOSPITAL ORDER, PERFORMED IN ~~LOC~~ HOSPITAL LAB): SARS Coronavirus 2: NEGATIVE

## 2019-07-30 LAB — TROPONIN I (HIGH SENSITIVITY): Troponin I (High Sensitivity): 2 ng/L (ref <18)

## 2019-07-30 MED ORDER — ACETAMINOPHEN 325 MG PO TABS
650.0000 mg | ORAL_TABLET | Freq: Once | ORAL | Status: AC
  Administered 2019-07-31: 650 mg via ORAL
  Filled 2019-07-30: qty 2

## 2019-07-30 MED ORDER — VERAPAMIL HCL ER 120 MG PO TBCR
120.0000 mg | EXTENDED_RELEASE_TABLET | Freq: Every day | ORAL | Status: DC
  Administered 2019-07-31: 120 mg via ORAL
  Filled 2019-07-30: qty 1

## 2019-07-30 MED ORDER — DOCUSATE SODIUM 100 MG PO CAPS
100.0000 mg | ORAL_CAPSULE | Freq: Every day | ORAL | Status: DC
  Administered 2019-07-31 – 2019-08-04 (×5): 100 mg via ORAL
  Filled 2019-07-30 (×5): qty 1

## 2019-07-30 MED ORDER — FLUTICASONE PROPIONATE 50 MCG/ACT NA SUSP
1.0000 | Freq: Every day | NASAL | Status: DC
  Administered 2019-08-02 – 2019-08-04 (×3): 1 via NASAL
  Filled 2019-07-30 (×2): qty 16

## 2019-07-30 MED ORDER — PANTOPRAZOLE SODIUM 40 MG PO TBEC
40.0000 mg | DELAYED_RELEASE_TABLET | Freq: Every day | ORAL | Status: DC
  Administered 2019-07-31 – 2019-08-04 (×5): 40 mg via ORAL
  Filled 2019-07-30 (×5): qty 1

## 2019-07-30 MED ORDER — HYDROCODONE-ACETAMINOPHEN 5-325 MG PO TABS
1.0000 | ORAL_TABLET | Freq: Four times a day (QID) | ORAL | Status: DC | PRN
  Administered 2019-07-31 – 2019-08-04 (×3): 1 via ORAL
  Filled 2019-07-30 (×3): qty 1

## 2019-07-30 MED ORDER — ATORVASTATIN CALCIUM 40 MG PO TABS
40.0000 mg | ORAL_TABLET | Freq: Every day | ORAL | Status: DC
  Administered 2019-07-31: 40 mg via ORAL
  Filled 2019-07-30: qty 1

## 2019-07-30 MED ORDER — POLYETHYLENE GLYCOL 3350 17 G PO PACK
17.0000 g | PACK | Freq: Every day | ORAL | Status: DC
  Administered 2019-07-31 – 2019-08-04 (×5): 17 g via ORAL
  Filled 2019-07-30 (×5): qty 1

## 2019-07-30 MED ORDER — ALBUTEROL SULFATE HFA 108 (90 BASE) MCG/ACT IN AERS
2.0000 | INHALATION_SPRAY | Freq: Once | RESPIRATORY_TRACT | Status: AC
  Administered 2019-07-30: 2 via RESPIRATORY_TRACT
  Filled 2019-07-30: qty 6.7

## 2019-07-30 MED ORDER — MIRTAZAPINE 7.5 MG PO TABS
7.5000 mg | ORAL_TABLET | Freq: Every day | ORAL | Status: DC
  Administered 2019-07-31: 7.5 mg via ORAL
  Filled 2019-07-30: qty 1

## 2019-07-30 MED ORDER — DONEPEZIL HCL 10 MG PO TABS
10.0000 mg | ORAL_TABLET | Freq: Every day | ORAL | Status: DC
  Administered 2019-07-31 – 2019-08-03 (×5): 10 mg via ORAL
  Filled 2019-07-30: qty 2
  Filled 2019-07-30 (×4): qty 1

## 2019-07-30 MED ORDER — ZONISAMIDE 100 MG PO CAPS
200.0000 mg | ORAL_CAPSULE | Freq: Every day | ORAL | Status: DC
  Administered 2019-07-31 – 2019-08-03 (×5): 200 mg via ORAL
  Filled 2019-07-30 (×8): qty 2

## 2019-07-30 MED ORDER — ASPIRIN EC 81 MG PO TBEC
81.0000 mg | DELAYED_RELEASE_TABLET | Freq: Every day | ORAL | Status: DC
  Administered 2019-07-31 – 2019-08-04 (×5): 81 mg via ORAL
  Filled 2019-07-30 (×5): qty 1

## 2019-07-30 MED ORDER — ONDANSETRON HCL 4 MG/2ML IJ SOLN: 4.0000 mg | Freq: Four times a day (QID) | INTRAMUSCULAR | Status: DC | PRN

## 2019-07-30 MED ORDER — ONDANSETRON HCL 4 MG PO TABS: 4.0000 mg | ORAL_TABLET | Freq: Four times a day (QID) | ORAL | Status: DC | PRN

## 2019-07-30 MED ORDER — MEMANTINE HCL 10 MG PO TABS
10.0000 mg | ORAL_TABLET | Freq: Two times a day (BID) | ORAL | Status: DC
  Administered 2019-07-31 – 2019-08-04 (×10): 10 mg via ORAL
  Filled 2019-07-30 (×11): qty 1

## 2019-07-30 MED ORDER — ACETAMINOPHEN 325 MG PO TABS
650.0000 mg | ORAL_TABLET | Freq: Once | ORAL | Status: AC
  Administered 2019-07-30: 650 mg via ORAL
  Filled 2019-07-30: qty 2

## 2019-07-30 MED ORDER — FERROUS SULFATE 325 (65 FE) MG PO TABS
325.0000 mg | ORAL_TABLET | Freq: Two times a day (BID) | ORAL | Status: DC
  Administered 2019-07-31 – 2019-08-04 (×8): 325 mg via ORAL
  Filled 2019-07-30 (×8): qty 1

## 2019-07-30 MED ORDER — RISPERIDONE 1 MG PO TABS
1.0000 mg | ORAL_TABLET | Freq: Every day | ORAL | Status: DC
  Administered 2019-07-31 – 2019-08-03 (×5): 1 mg via ORAL
  Filled 2019-07-30 (×5): qty 1

## 2019-07-30 MED ORDER — TRAZODONE HCL 100 MG PO TABS
50.0000 mg | ORAL_TABLET | Freq: Every day | ORAL | Status: DC
  Administered 2019-07-31: 50 mg via ORAL
  Filled 2019-07-30: qty 1

## 2019-07-30 MED ORDER — HEPARIN SODIUM (PORCINE) 5000 UNIT/ML IJ SOLN
5000.0000 [IU] | Freq: Three times a day (TID) | INTRAMUSCULAR | Status: DC
  Administered 2019-07-31 – 2019-08-04 (×12): 5000 [IU] via SUBCUTANEOUS
  Filled 2019-07-30 (×12): qty 1

## 2019-07-30 MED ORDER — MELATONIN 5 MG PO TABS
10.0000 mg | ORAL_TABLET | Freq: Every day | ORAL | Status: DC
  Administered 2019-07-31 – 2019-08-03 (×5): 10 mg via ORAL
  Filled 2019-07-30 (×4): qty 2

## 2019-07-30 MED ORDER — ESCITALOPRAM OXALATE 10 MG PO TABS
20.0000 mg | ORAL_TABLET | Freq: Every day | ORAL | Status: DC
  Administered 2019-07-31: 20 mg via ORAL
  Filled 2019-07-30: qty 2

## 2019-07-30 NOTE — ED Triage Notes (Signed)
Arrives via EMS from [facility] from assisted living, C/C SOB for x2 weeks to about 1 month. Patient was about 90-95% RA with EMS with normal RR, placed on 2 L. EMS reports clear lung sounds. Has had both doses of COVID-19 vaccine.

## 2019-07-30 NOTE — ED Notes (Signed)
Patients SPO2 remained between 88-92% while ambulating. Patient assisted back to bed and reconnected to all monitoring devices.

## 2019-07-30 NOTE — H&P (Signed)
History and Physical    Jamie George SAY:[id number] DOB: [birth year] DOA: 07/30/2019  PCP: Kristen Loader, FNP  Patient coming from: [facility] assisted living facility.  Chief Complaint: Shortness of breath.  HPI: Jamie George is a 74 y.o. female with history of hypertension migraine dementia chronic abdominal dissection presents to the ER with complaints of having ongoing shortness of breath for the last 2 to 3 weeks.  Patient states she gets short of breath on any activity and even at rest.  For the same duration of time patient has been having retrosternal chest pain which patient is not able to characterize exactly.  Denies any radiation had some nausea and abdominal discomfort.  Patient had come to the ER on July 25, 2029 at the time CT angiogram was negative for pulmonary embolism and discharged back to the facility.  Since patient is still complaining of shortness of breath patient was brought to the ER.  ED Course: In the ER chest x-ray was unremarkable EKG shows normal sinus rhythm with prolonged QTC blood pressure is mildly elevated at systolic 235.  BNP high sensitive troponin was negative WBC is 10.6 creatinine 1.2 at baseline.  ER physician felt patient may have been slightly confused CT head was negative but patient is back to baseline with her mental status.  Covid test is negative.  Review of Systems: As per HPI, rest all negative.   Past Medical History:  Diagnosis Date  . Cancer ([place])    skin  . Complication of anesthesia    " difficult to wake me up "  . Depression   . Duodenitis   . GERD (gastroesophageal reflux disease)   . Insomnia   . Migraines   . Mild dementia [facility])     Past Surgical History:  Procedure Laterality Date  . ABDOMINAL HYSTERECTOMY    . BACK SURGERY    . BIOPSY  03/04/2018   Procedure: BIOPSY;  Surgeon: Otis Brace, MD;  Location: [facility];  Service: Gastroenterology;;  . BRAIN SURGERY  2013   meningioma  .  ENTEROSCOPY N/A 03/04/2018   Procedure: PUSH ENTEROSCOPY;  Surgeon: Otis Brace, MD;  Location: [facility];  Service: Gastroenterology;  Laterality: N/A;  . SHOULDER SURGERY       reports that she has quit smoking. She has quit using smokeless tobacco. She reports previous alcohol use. She reports that she does not use drugs.  No Known Allergies  Family History  Problem Relation Age of Onset  . Cancer Mother   . Stroke Father   . Heart disease Father     Prior to Admission medications   Medication Sig Start Date End Date Taking? Authorizing Provider  acetaminophen (TYLENOL 8 HOUR) 650 MG CR tablet Take 1 tablet (650 mg total) by mouth every 8 (eight) hours as needed for pain. Patient not taking: Reported on 02/03/2019 03/31/18   Petrucelli, Glynda Jaeger, PA-C  acetaminophen (TYLENOL) 325 MG tablet Take 650 mg by mouth in the morning and at bedtime.     [provider]  aspirin EC 81 MG EC tablet Take 1 tablet (81 mg total) by mouth daily. With food 08/15/17   Roxan Hockey, MD  atorvastatin (LIPITOR) 40 MG tablet Take 40 mg by mouth daily.     [provider]  docusate sodium (COLACE) 100 MG capsule Take 100 mg by mouth daily.    [provider]  donepezil (ARICEPT) 10 MG tablet Take 10 mg by mouth at bedtime.  [provider]  escitalopram (LEXAPRO) 20 MG tablet Take 20 mg by mouth daily.  05/03/17   [provider]  ferrous sulfate 325 (65 FE) MG tablet Take 325 mg by mouth 2 (two) times daily with a meal.    [provider]  fluticasone (FLONASE) 50 MCG/ACT nasal spray Place 1 spray into both nostrils daily.    [provider]  HYDROcodone-acetaminophen (NORCO/VICODIN) 5-325 MG tablet Take 1 tablet by mouth every 6 (six) hours as needed for moderate pain. Patient not taking: Reported on 07/04/2019 02/03/19   Milton Ferguson, MD  melatonin 5 MG TABS Take 10 mg by mouth at bedtime.    [provider]    memantine (NAMENDA) 10 MG tablet Take 1 tablet (10 mg total) by mouth 2 (two) times daily. 12/17/17   Penumalli, Earlean Polka, MD  mirtazapine (REMERON) 7.5 MG tablet Take 7.5 mg by mouth at bedtime.    [provider]  ondansetron (ZOFRAN) 4 MG tablet Take 1 tablet (4 mg total) by mouth every 6 (six) hours as needed for nausea. Patient not taking: Reported on 02/03/2019 08/14/17   Roxan Hockey, MD  pantoprazole (PROTONIX) 40 MG tablet Take 40 mg by mouth daily.     [provider]  polyethylene glycol (MIRALAX / GLYCOLAX) packet Take 17 g by mouth daily.    [provider]  risperiDONE (RISPERDAL) 0.5 MG tablet Take 1 mg by mouth at bedtime.     [provider]  traZODone (DESYREL) 50 MG tablet Take 50 mg by mouth at bedtime.  05/03/17   [provider]  verapamil (CALAN-SR) 120 MG CR tablet Take 120 mg by mouth daily.    [provider]  zonisamide (ZONEGRAN) 100 MG capsule Take 200 mg by mouth at bedtime.     [provider]    Physical Exam: Constitutional: Moderately built and nourished. Vitals:   07/30/19 2001 07/30/19 2030 07/30/19 2033 07/30/19 2130  BP:  (!) 148/74  (!) 159/98  Pulse:  (!) 58  (!) 57  Resp:  11  14  Temp:      TempSrc:      SpO2: 92% (!) 87% 91% 90%  Weight:      Height:       Eyes: Anicteric no pallor. ENMT: No discharge from the ears eyes nose or mouth. Neck: No mass felt.  No neck rigidity. Respiratory: No rhonchi or crepitations. Cardiovascular: S1-S2 heard. Abdomen: Soft nontender bowel sounds present. Musculoskeletal: No edema. Skin: No rash. Neurologic: Alert awake oriented to her name and place moves all extremities. Psychiatric: Oriented to name and place.   Labs on Admission: I have personally reviewed following labs and imaging studies  CBC: Recent Labs  Lab 07/26/19 0959 07/30/19 1845  WBC 11.6* 10.6*  NEUTROABS 8.0* 6.5  HGB 16.6* 15.6*  HCT 53.1* 48.7*  MCV 93.2 92.4   PLT 377 397   Basic Metabolic Panel: Recent Labs  Lab 07/26/19 0959 07/26/19 1225 07/30/19 1845  NA 142  --  145  K 3.4*  --  3.6  CL 104  --  113*  CO2 24  --  24  GLUCOSE 98  --  93  BUN 10  --  13  CREATININE 1.44*  --  1.26*  CALCIUM 9.3  --  9.0  MG  --  2.0  --    GFR: Estimated Creatinine Clearance: 37.5 mL/min (A) (by C-G formula based on SCr of 1.26 mg/dL (H)). Liver  Function Tests: Recent Labs  Lab 07/26/19 0959  AST 15  ALT 11  ALKPHOS 78  BILITOT 0.7  PROT 6.2*  ALBUMIN 3.3*   No results for input(s): LIPASE, AMYLASE in the last 168 hours. No results for input(s): AMMONIA in the last 168 hours. Coagulation Profile: No results for input(s): INR, PROTIME in the last 168 hours. Cardiac Enzymes: No results for input(s): CKTOTAL, CKMB, CKMBINDEX, TROPONINI in the last 168 hours. BNP (last 3 results) No results for input(s): PROBNP in the last 8760 hours. HbA1C: No results for input(s): HGBA1C in the last 72 hours. CBG: No results for input(s): GLUCAP in the last 168 hours. Lipid Profile: No results for input(s): CHOL, HDL, LDLCALC, TRIG, CHOLHDL, LDLDIRECT in the last 72 hours. Thyroid Function Tests: No results for input(s): TSH, T4TOTAL, FREET4, T3FREE, THYROIDAB in the last 72 hours. Anemia Panel: No results for input(s): VITAMINB12, FOLATE, FERRITIN, TIBC, IRON, RETICCTPCT in the last 72 hours. Urine analysis:    Component Value Date/Time   COLORURINE YELLOW 07/26/2019 1331   APPEARANCEUR CLEAR 07/26/2019 1331   LABSPEC 1.031 (H) 07/26/2019 1331   PHURINE 5.0 07/26/2019 1331   GLUCOSEU NEGATIVE 07/26/2019 1331   HGBUR NEGATIVE 07/26/2019 1331   BILIRUBINUR MODERATE (A) 07/26/2019 1331   KETONESUR 5 (A) 07/26/2019 1331   PROTEINUR 30 (A) 07/26/2019 1331   NITRITE NEGATIVE 07/26/2019 1331   LEUKOCYTESUR NEGATIVE 07/26/2019 1331   Sepsis Labs: @LABRCNTIP (procalcitonin:4,lacticidven:4) ) Recent Results (from the past 240 hour(s))  SARS  Coronavirus 2 by RT PCR (hospital order, performed in [place] hospital lab) Nasopharyngeal Nasopharyngeal Swab     Status: None   Collection Time: 07/30/19  6:45 PM   Specimen: Nasopharyngeal Swab  Result Value Ref Range Status   SARS Coronavirus 2 NEGATIVE NEGATIVE Final    Comment: (NOTE) SARS-CoV-2 target nucleic acids are NOT DETECTED. The SARS-CoV-2 RNA is generally detectable in upper and lower respiratory specimens during the acute phase of infection. The lowest concentration of SARS-CoV-2 viral copies this assay can detect is 250 copies / mL. A negative result does not preclude SARS-CoV-2 infection and should not be used as the sole basis for treatment or other patient management decisions.  A negative result may occur with improper specimen collection / handling, submission of specimen other than nasopharyngeal swab, presence of viral mutation(s) within the areas targeted by this assay, and inadequate number of viral copies (<250 copies / mL). A negative result must be combined with clinical observations, patient history, and epidemiological information. Fact Sheet for Patients:   StrictlyIdeas.no Fact Sheet for Healthcare Providers: BankingDealers.co.za This test is not yet approved or cleared  by the [place] FDA and has been authorized for detection and/or diagnosis of SARS-CoV-2 by FDA under an Emergency Use Authorization (EUA).  This EUA will remain in effect (meaning this test can be used) for the duration of the COVID-19 declaration under Section 564(b)(1) of the Act, 21 U.S.C. section 360bbb-3(b)(1), unless the authorization is terminated or revoked sooner. Performed at [facility], [place] [address], [place], [place] [address]      Radiological Exams on Admission: CT Head Wo Contrast  Result Date: 07/30/2019 CLINICAL DATA:  Headaches. EXAM: CT HEAD WITHOUT CONTRAST TECHNIQUE: Contiguous axial  images were obtained from the base of the skull through the vertex without intravenous contrast. COMPARISON:  Jul 04, 2019 FINDINGS: Brain: There is mild cerebral atrophy with widening of the extra-axial spaces and ventricular dilatation. There are areas of decreased attenuation within  the white matter tracts of the supratentorial brain, consistent with microvascular disease changes. A small chronic right basal ganglia lacunar infarct is seen. Vascular: No hyperdense vessel or unexpected calcification. Skull: A left frontal craniotomy defect is seen. Sinuses/Orbits: There is moderate severity bilateral maxillary sinus mucosal thickening. Mild to moderate severity sphenoid sinus mucosal thickening is also seen. Other: None. IMPRESSION: 1. Generalized cerebral atrophy. 2. No acute intracranial abnormality. 3. Moderate severity bilateral maxillary and sphenoid sinus disease. 4. Left frontal craniotomy. Electronically Signed   By: Virgina Norfolk M.D.   On: 07/30/2019 20:57   DG Chest Portable 1 View  Result Date: 07/30/2019 CLINICAL DATA:  Short of breath for 2 weeks, cough, previous tobacco abuse EXAM: PORTABLE CHEST 1 VIEW COMPARISON:  07/26/2019 FINDINGS: The heart size and mediastinal contours are within normal limits. Both lungs are clear. The visualized skeletal structures are unremarkable. IMPRESSION: No active disease. Electronically Signed   By: Randa Ngo M.D.   On: 07/30/2019 19:23    EKG: Independently reviewed.  Normal sinus rhythm prolonged QTC.  Assessment/Plan Principal Problem:   Chest pain Active Problems:   Dissection of abdominal aorta (HCC)   CKD (chronic kidney disease)   Dementia (HCC)   Dyspnea    1. Chest pain with dyspnea cause not clear.  2D echo done in 2019 did show grade 1 diastolic dysfunction but at this time patient appears not in fluid overload BNP is normal CT angio done 5 days ago was showing nothing acute.  Will admit and observe overnight in telemetry  consult cardiology.  Symptoms may be related to uncontrolled hypertension. 2. Hypertension on verapamil.  Closely follow blood pressure trends.  May need additional blood pressure medications if blood pressure remains consistently high. 3. History of chronic abdominal dissection -we will check CT abdomen pelvis with contrast. 4. History of migraine on verapamil and zonisamide. 5. Dementia on Aricept and Namenda. 6. Chronic and disease stage III creatinine appears to be at baseline. 7. Prolonged QTC closely monitor metabolic panel and also avoid TC prolonging medications.   DVT prophylaxis: Heparin. Code Status: Full code. Family Communication: We will need to discuss with family. Disposition Plan: Back to facility. Consults called: Cardiology. Admission status: Observation.   Rise Patience MD [facility] Pager [phone].  If 7PM-7AM, please contact night-coverage [email or web address] Password [facility]  07/30/2019, 10:21 PM

## 2019-07-30 NOTE — ED Provider Notes (Addendum)
[facility] DEPT Provider Note   CSN: [id number] Arrival date & time: 07/30/19  1751     History Chief Complaint  Patient presents with  . Shortness of Breath    Jamie George is a 74 y.o. female.  HPI    Pt is a 74 y/o female with a h/o skin CA, depression, duodenitis, GERD, migraines, dementia, who presents to the ED today for eval of shortness of breath at rest and with exertion. States she has had sxs for the last 3 weeks. States she is coughing up "orange goople gopple". States she was having some chest pain to the middle of her chest. This has been present for the last 2 weeks as well. She also reports rhinorrhea, congestion. She further c/o a headache. States she has a long h/o headaches and this particular one started 5 weeks ago. States pain is right in the center of her forehead. Pain is constant. States that "Bufferin", Excedrin and Tylenol have helped sxs in the past.   Denies vomiting, diarrhea, abd pain.  Past Medical History:  Diagnosis Date  . Cancer ([place])    skin  . Complication of anesthesia    " difficult to wake me up "  . Depression   . Duodenitis   . GERD (gastroesophageal reflux disease)   . Insomnia   . Migraines   . Mild dementia [facility])     Patient Active Problem List   Diagnosis Date Noted  . Dementia ([place]) 01/02/2018  . Aggression 01/02/2018  . Dissection of abdominal aorta ([place]) 08/13/2017  . Chest pain 08/13/2017  . Hypertension 08/13/2017  . CKD (chronic kidney disease) 08/13/2017  . Anemia 08/13/2017    Past Surgical History:  Procedure Laterality Date  . ABDOMINAL HYSTERECTOMY    . BACK SURGERY    . BIOPSY  03/04/2018   Procedure: BIOPSY;  Surgeon: Otis Brace, MD;  Location: [facility];  Service: Gastroenterology;;  . BRAIN SURGERY  2013   meningioma  . ENTEROSCOPY N/A 03/04/2018   Procedure: PUSH ENTEROSCOPY;  Surgeon: Otis Brace, MD;  Location: [facility];  Service:  Gastroenterology;  Laterality: N/A;  . SHOULDER SURGERY       OB History   No obstetric history on file.     Family History  Problem Relation Age of Onset  . Cancer Mother   . Stroke Father   . Heart disease Father     Social History   Tobacco Use  . Smoking status: Former Research scientist (life sciences)  . Smokeless tobacco: Former Network engineer Use Topics  . Alcohol use: Not Currently  . Drug use: Never    Home Medications Prior to Admission medications   Medication Sig Start Date End Date Taking? Authorizing Provider  acetaminophen (TYLENOL 8 HOUR) 650 MG CR tablet Take 1 tablet (650 mg total) by mouth every 8 (eight) hours as needed for pain. Patient not taking: Reported on 02/03/2019 03/31/18   Petrucelli, Glynda Jaeger, PA-C  acetaminophen (TYLENOL) 325 MG tablet Take 650 mg by mouth in the morning and at bedtime.     [provider]  aspirin EC 81 MG EC tablet Take 1 tablet (81 mg total) by mouth daily. With food 08/15/17   Roxan Hockey, MD  atorvastatin (LIPITOR) 40 MG tablet Take 40 mg by mouth daily.     [provider]  docusate sodium (COLACE) 100 MG capsule Take 100 mg by mouth daily.    [provider]  donepezil (ARICEPT) 10 MG  tablet Take 10 mg by mouth at bedtime.    [provider]  escitalopram (LEXAPRO) 20 MG tablet Take 20 mg by mouth daily.  05/03/17   [provider]  ferrous sulfate 325 (65 FE) MG tablet Take 325 mg by mouth 2 (two) times daily with a meal.    [provider]  fluticasone (FLONASE) 50 MCG/ACT nasal spray Place 1 spray into both nostrils daily.    [provider]  HYDROcodone-acetaminophen (NORCO/VICODIN) 5-325 MG tablet Take 1 tablet by mouth every 6 (six) hours as needed for moderate pain. Patient not taking: Reported on 07/04/2019 02/03/19   Milton Ferguson, MD  melatonin 5 MG TABS Take 10 mg by mouth at bedtime.    [provider]  memantine (NAMENDA) 10 MG tablet Take 1 tablet (10 mg  total) by mouth 2 (two) times daily. 12/17/17   Penumalli, Earlean Polka, MD  mirtazapine (REMERON) 7.5 MG tablet Take 7.5 mg by mouth at bedtime.    [provider]  ondansetron (ZOFRAN) 4 MG tablet Take 1 tablet (4 mg total) by mouth every 6 (six) hours as needed for nausea. Patient not taking: Reported on 02/03/2019 08/14/17   Roxan Hockey, MD  pantoprazole (PROTONIX) 40 MG tablet Take 40 mg by mouth daily.     [provider]  polyethylene glycol (MIRALAX / GLYCOLAX) packet Take 17 g by mouth daily.    [provider]  risperiDONE (RISPERDAL) 0.5 MG tablet Take 1 mg by mouth at bedtime.     [provider]  traZODone (DESYREL) 50 MG tablet Take 50 mg by mouth at bedtime.  05/03/17   [provider]  verapamil (CALAN-SR) 120 MG CR tablet Take 120 mg by mouth daily.    [provider]  zonisamide (ZONEGRAN) 100 MG capsule Take 200 mg by mouth at bedtime.     [provider]    Allergies    Patient has no known allergies.  Review of Systems   Review of Systems  Constitutional: Negative for chills.  HENT: Negative for ear pain and sore throat.   Eyes: Negative for visual disturbance.  Respiratory: Positive for cough and shortness of breath.   Cardiovascular: Positive for chest pain.  Gastrointestinal: Negative for abdominal pain, constipation, diarrhea, nausea and vomiting.  Genitourinary: Negative for dysuria and hematuria.  Musculoskeletal: Negative for back pain.  Skin: Negative for rash.  Neurological: Positive for headaches.  All other systems reviewed and are negative.   Physical Exam Updated Vital Signs BP (!) 148/74   Pulse (!) 58   Temp (!) 97.5 F (36.4 C) (Oral)   Resp 11   Ht 5\' 7"  (1.702 m)   Wt 73 kg   SpO2 91%   BMI 25.21 kg/m   Physical Exam Vitals and nursing note reviewed.  Constitutional:      General: She is not in acute distress.    Appearance: She is well-developed.     Comments: Asking  repetitive questions  HENT:     Head: Normocephalic and atraumatic.  Eyes:     Conjunctiva/sclera: Conjunctivae normal.  Cardiovascular:     Rate and Rhythm: Normal rate and regular rhythm.     Heart sounds: Normal heart sounds. No murmur.  Pulmonary:     Effort: Pulmonary effort is normal. No respiratory distress.     Breath sounds: Normal breath sounds. No wheezing, rhonchi or rales.  Abdominal:     General: Bowel sounds are normal.  Palpations: Abdomen is soft.     Tenderness: There is no abdominal tenderness. There is no guarding or rebound.  Musculoskeletal:     Cervical back: Neck supple.  Skin:    General: Skin is warm and dry.  Neurological:     Mental Status: She is alert.     Comments: Mental Status:  Alert, thought content appropriate, able to give a coherent history. Speech fluent without evidence of aphasia. Able to follow 2 step commands without difficulty.  Cranial Nerves:  II:  pupils equal, round, reactive to light III,IV, VI: ptosis not present, extra-ocular motions intact bilaterally  V,VII: smile symmetric, facial light touch sensation equal VIII: hearing grossly normal to voice  X: uvula elevates symmetrically  XI: bilateral shoulder shrug symmetric and strong XII: midline tongue extension without fassiculations Motor:  Normal tone. 5/5 strength of BUE and BLE major muscle groups including strong and equal grip strength and dorsiflexion/plantar flexion Sensory: light touch normal in all extremities.  Psychiatric:     Comments: anxious     ED Results / Procedures / Treatments   Labs (all labs ordered are listed, but only abnormal results are displayed) Labs Reviewed  CBC WITH DIFFERENTIAL/PLATELET - Abnormal; Notable for the following components:      Result Value   WBC 10.6 (*)    RBC 5.27 (*)    Hemoglobin 15.6 (*)    HCT 48.7 (*)    Monocytes Absolute 1.1 (*)    Basophils Absolute 0.2 (*)    All other components within normal limits  BASIC  METABOLIC PANEL - Abnormal; Notable for the following components:   Chloride 113 (*)    Creatinine, Ser 1.26 (*)    GFR calc non Af Amer 42 (*)    GFR calc Af Amer 48 (*)    All other components within normal limits  SARS CORONAVIRUS 2 BY RT PCR (HOSPITAL ORDER, [place] [facility])  BRAIN NATRIURETIC PEPTIDE  TROPONIN I (HIGH SENSITIVITY)  TROPONIN I (HIGH SENSITIVITY)    EKG EKG Interpretation  Date/Time:  Wednesday July 30 2019 18:15:25 EDT Ventricular Rate:  67 PR Interval:    QRS Duration: 90 QT Interval:  489 QTC Calculation: 517 R Axis:   70 Text Interpretation: Sinus rhythm Borderline low voltage, extremity leads Prolonged QT interval Confirmed by Madalyn Rob [phone]) on 07/30/2019 8:25:24 PM   Radiology CT Head Wo Contrast  Result Date: 07/30/2019 CLINICAL DATA:  Headaches. EXAM: CT HEAD WITHOUT CONTRAST TECHNIQUE: Contiguous axial images were obtained from the base of the skull through the vertex without intravenous contrast. COMPARISON:  Jul 04, 2019 FINDINGS: Brain: There is mild cerebral atrophy with widening of the extra-axial spaces and ventricular dilatation. There are areas of decreased attenuation within the white matter tracts of the supratentorial brain, consistent with microvascular disease changes. A small chronic right basal ganglia lacunar infarct is seen. Vascular: No hyperdense vessel or unexpected calcification. Skull: A left frontal craniotomy defect is seen. Sinuses/Orbits: There is moderate severity bilateral maxillary sinus mucosal thickening. Mild to moderate severity sphenoid sinus mucosal thickening is also seen. Other: None. IMPRESSION: 1. Generalized cerebral atrophy. 2. No acute intracranial abnormality. 3. Moderate severity bilateral maxillary and sphenoid sinus disease. 4. Left frontal craniotomy. Electronically Signed   By: Virgina Norfolk M.D.   On: 07/30/2019 20:57   DG Chest Portable 1 View  Result Date:  07/30/2019 CLINICAL DATA:  Short of breath for 2 weeks, cough, previous tobacco abuse EXAM: PORTABLE CHEST 1  VIEW COMPARISON:  07/26/2019 FINDINGS: The heart size and mediastinal contours are within normal limits. Both lungs are clear. The visualized skeletal structures are unremarkable. IMPRESSION: No active disease. Electronically Signed   By: Randa Ngo M.D.   On: 07/30/2019 19:23    Procedures Procedures (including critical care time)  Medications Ordered in ED Medications  albuterol (VENTOLIN HFA) 108 (90 Base) MCG/ACT inhaler 2 puff (has no administration in time range)  acetaminophen (TYLENOL) tablet 650 mg (650 mg Oral Given 07/30/19 1842)    ED Course  I have reviewed the triage vital signs and the nursing notes.  Pertinent labs & imaging results that were available during my care of the patient were reviewed by me and considered in my medical decision making (see chart for details).    MDM Rules/Calculators/A&P                      74 year old female presenting for evaluation of shortness of breath that has been ongoing for the last 3 weeks.    On arrival she satting in 100% on room air and the remainder of her vital signs are reassuring.  Her exam is reassuring.  Patient has been seen in the ED multiple times on chart review.  She just had a negative CT PE study 3 days ago.  CBC with mild leukocytosis, elevated hemoglobin consistent with prior BMP with slightly elevated creatinine which is improved from prior 4 days ago.  No significant electrolyte abnormality Troponin negative  EKG Sinus rhythm Borderline low voltage, extremity leads Prolonged QT interval  Chest x-ray with No active disease.  CT head with generalized cerebral atrophy. 2. No acute intracranial abnormality. 3. Moderate severity bilateral maxillary and sphenoid sinus disease. 4. Left frontal craniotomy.  Pt ambulated and sats dropped to 88-92% on RA.   At shift change, pt awaiting COVID test and BNP.  Care transitioned to Dr. Roslynn Amble to f/u on pending w/u.    Final Clinical Impression(s) / ED Diagnoses Final diagnoses:  Shortness of breath    Rx / DC Orders ED Discharge Orders    None       Rodney Booze, PA-C 07/30/19 2109    Rodney Booze, PA-C 07/30/19 2132    Lucrezia Starch, MD 07/31/19 1309

## 2019-07-30 NOTE — ED Notes (Signed)
Message sent to pharmacy regarding patients unverified medications. States they are waiting for a fax from the patients facility. Will continue to monitor.

## 2019-07-31 ENCOUNTER — Observation Stay (HOSPITAL_BASED_OUTPATIENT_CLINIC_OR_DEPARTMENT_OTHER): Payer: Medicare PPO

## 2019-07-31 DIAGNOSIS — R0602 Shortness of breath: Secondary | ICD-10-CM

## 2019-07-31 DIAGNOSIS — R079 Chest pain, unspecified: Secondary | ICD-10-CM | POA: Diagnosis not present

## 2019-07-31 DIAGNOSIS — F0391 Unspecified dementia with behavioral disturbance: Secondary | ICD-10-CM

## 2019-07-31 DIAGNOSIS — I7102 Dissection of abdominal aorta: Secondary | ICD-10-CM

## 2019-07-31 DIAGNOSIS — I10 Essential (primary) hypertension: Secondary | ICD-10-CM

## 2019-07-31 DIAGNOSIS — N1832 Chronic kidney disease, stage 3b: Secondary | ICD-10-CM

## 2019-07-31 DIAGNOSIS — J9601 Acute respiratory failure with hypoxia: Secondary | ICD-10-CM | POA: Diagnosis not present

## 2019-07-31 LAB — BASIC METABOLIC PANEL
Anion gap: 11 (ref 5–15)
BUN: 12 mg/dL (ref 8–23)
CO2: 21 mmol/L — ABNORMAL LOW (ref 22–32)
Calcium: 8.5 mg/dL — ABNORMAL LOW (ref 8.9–10.3)
Chloride: 109 mmol/L (ref 98–111)
Creatinine, Ser: 1.17 mg/dL — ABNORMAL HIGH (ref 0.44–1.00)
GFR calc Af Amer: 53 mL/min — ABNORMAL LOW (ref >60)
GFR calc non Af Amer: 46 mL/min — ABNORMAL LOW (ref >60)
Glucose, Bld: 79 mg/dL (ref 70–99)
Potassium: 3.1 mmol/L — ABNORMAL LOW (ref 3.5–5.1)
Sodium: 141 mmol/L (ref 135–145)

## 2019-07-31 LAB — LIPID PANEL
Cholesterol: 190 mg/dL (ref 0–200)
HDL: 36 mg/dL — ABNORMAL LOW (ref >40)
LDL Cholesterol: 110 mg/dL — ABNORMAL HIGH (ref 0–99)
Total CHOL/HDL Ratio: 5.3 RATIO
Triglycerides: 218 mg/dL — ABNORMAL HIGH (ref <150)
VLDL: 44 mg/dL — ABNORMAL HIGH (ref 0–40)

## 2019-07-31 LAB — CBC
HCT: 48.3 % — ABNORMAL HIGH (ref 36.0–46.0)
Hemoglobin: 15.7 g/dL — ABNORMAL HIGH (ref 12.0–15.0)
MCH: 29.9 pg (ref 26.0–34.0)
MCHC: 32.5 g/dL (ref 30.0–36.0)
MCV: 92 fL (ref 80.0–100.0)
Platelets: 319 10*3/uL (ref 150–400)
RBC: 5.25 MIL/uL — ABNORMAL HIGH (ref 3.87–5.11)
RDW: 15 % (ref 11.5–15.5)
WBC: 10.1 10*3/uL (ref 4.0–10.5)
nRBC: 0 % (ref 0.0–0.2)

## 2019-07-31 LAB — CREATININE, SERUM
Creatinine, Ser: 1.18 mg/dL — ABNORMAL HIGH (ref 0.44–1.00)
GFR calc Af Amer: 52 mL/min — ABNORMAL LOW (ref >60)
GFR calc non Af Amer: 45 mL/min — ABNORMAL LOW (ref >60)

## 2019-07-31 LAB — MAGNESIUM: Magnesium: 2.1 mg/dL (ref 1.7–2.4)

## 2019-07-31 LAB — TSH: TSH: 1.429 u[IU]/mL (ref 0.350–4.500)

## 2019-07-31 LAB — ECHOCARDIOGRAM COMPLETE
Height: 67 in
Weight: 2574.97 oz

## 2019-07-31 LAB — TROPONIN I (HIGH SENSITIVITY): Troponin I (High Sensitivity): 3 ng/L (ref <18)

## 2019-07-31 MED ORDER — POTASSIUM CHLORIDE CRYS ER 20 MEQ PO TBCR
40.0000 meq | EXTENDED_RELEASE_TABLET | ORAL | Status: AC
  Administered 2019-07-31 (×2): 40 meq via ORAL
  Filled 2019-07-31 (×2): qty 2

## 2019-07-31 MED ORDER — HYDROCODONE-ACETAMINOPHEN 5-325 MG PO TABS
ORAL_TABLET | ORAL | Status: AC
  Administered 2019-07-31: 1 via ORAL
  Filled 2019-07-31: qty 1

## 2019-07-31 MED ORDER — ALUM & MAG HYDROXIDE-SIMETH 200-200-20 MG/5ML PO SUSP
30.0000 mL | Freq: Once | ORAL | Status: AC
  Administered 2019-07-31: 30 mL via ORAL
  Filled 2019-07-31: qty 30

## 2019-07-31 MED ORDER — AMLODIPINE BESYLATE 5 MG PO TABS
5.0000 mg | ORAL_TABLET | Freq: Every day | ORAL | Status: DC
  Administered 2019-07-31 – 2019-08-04 (×5): 5 mg via ORAL
  Filled 2019-07-31 (×5): qty 1

## 2019-07-31 MED ORDER — ACETAMINOPHEN 325 MG PO TABS
650.0000 mg | ORAL_TABLET | Freq: Four times a day (QID) | ORAL | Status: DC | PRN
  Administered 2019-07-31 – 2019-08-04 (×12): 650 mg via ORAL
  Filled 2019-07-31 (×11): qty 2

## 2019-07-31 MED ORDER — ATORVASTATIN CALCIUM 40 MG PO TABS
80.0000 mg | ORAL_TABLET | Freq: Every day | ORAL | Status: DC
  Administered 2019-08-01 – 2019-08-04 (×4): 80 mg via ORAL
  Filled 2019-07-31 (×4): qty 2

## 2019-07-31 MED ORDER — PERFLUTREN LIPID MICROSPHERE
1.0000 mL | INTRAVENOUS | Status: AC | PRN
  Administered 2019-07-31: 3 mL via INTRAVENOUS
  Filled 2019-07-31: qty 10

## 2019-07-31 NOTE — Evaluation (Signed)
Occupational Therapy Evaluation Patient Details Name: Jamie George MRN: [id number] DOB: 10-May-1945 Today's Date: 07/31/2019    History of Present Illness Pt is a 74 y.o. female with history of hypertension, migraine, dementia, chronic abdominal aortic dissection. Patient presented from her ALF secondary to shortness of breath. This has been a progressive problem for which she has sought repeated medical evaluation.   Clinical Impression   Mrs. Jamie George is a 74 year old woman admitted to hospital for evaluation of chest pain. On evaluation patient demonstrates functional ROM and strength of upper extremities, demonstrates ability to perform functional mobility and ADLs. Patient appears to be at her baseline in regards to self care tasks. Patient does demonstrate a shuffling gait and may benefit from further PT services. No OT needs at this time.    Follow Up Recommendations  No OT follow up    Equipment Recommendations  None recommended by OT    Recommendations for Other Services       Precautions / Restrictions Precautions Precautions: None Restrictions Weight Bearing Restrictions: No      Mobility Bed Mobility Overal bed mobility: Needs Assistance Bed Mobility: Supine to Sit;Sit to Supine     Supine to sit: Min guard Sit to supine: Min guard   General bed mobility comments: increased time  Transfers Overall transfer level: Needs assistance Equipment used: Rolling walker (2 wheeled) Transfers: Sit to/from Stand Sit to Stand: Min guard         General transfer comment: cued for safe hand placement; able to perform sit to stand from bed and from toilet.  Pt performed toileting ADLs with supervision for safety.    Balance Overall balance assessment: Mild deficits observed, not formally tested Sitting-balance support: No upper extremity supported Sitting balance-Leahy Scale: Good     Standing balance support: During functional activity Standing  balance-Leahy Scale: Good Standing balance comment: Able to perform toielting ADLs in standing without UE support                           ADL either performed or assessed with clinical judgement   ADL Overall ADL's : Needs assistance/impaired Eating/Feeding: Set up   Grooming: Set up   Upper Body Bathing: Set up;Sitting   Lower Body Bathing: Set up;Sit to/from stand   Upper Body Dressing : Set up;Sitting   Lower Body Dressing: Set up;Sit to/from stand   Toilet Transfer: Min guard;RW   Toileting- Water quality scientist and Hygiene: Min guard;Sit to/from stand       Functional mobility during ADLs: Min guard;Rolling walker General ADL Comments: verbal cues to take bigger steps to reduce shuffle, and stay inside walker     Vision   Vision Assessment?: No apparent visual deficits     Perception     Praxis      Pertinent Vitals/Pain Pain Assessment: No/denies pain     Hand Dominance Right   Extremity/Trunk Assessment Upper Extremity Assessment Upper Extremity Assessment: Overall WFL for tasks assessed   Lower Extremity Assessment Lower Extremity Assessment: Defer to PT evaluation   Cervical / Trunk Assessment Cervical / Trunk Assessment: Normal   Communication Communication Communication: No difficulties   Cognition Arousal/Alertness: Awake/alert Behavior During Therapy: WFL for tasks assessed/performed Overall Cognitive Status: Within Functional Limits for tasks assessed  General Comments: Oriented to self and place and year - states its August.  Pt with hx of dementia - potentially at baseline cognition.  Pt able to follow basic commands.   General Comments  Pt on 2 LPM O2 at arrival with sats 93%.  Placed on RA.  Once sitting EOB sats 96% and sats up to 98% with ambulation.  When pt returned back to supine sats 90% on RA.  Placed back on 2 LPM o2.    Exercises     Shoulder Instructions       Home Living Family/patient expects to be discharged to:: Assisted living                             Home Equipment: Walker - 4 wheels          Prior Functioning/Environment Level of Independence: Independent        Comments: Pt lives at [facility].  Reports independent with ADLs.  Goes to dining hall for meals.  Uses rollator to ambulate. Questionable historian due to hx of dementia.        OT Problem List:        OT Treatment/Interventions:      OT Goals(Current goals can be found in the care plan section) Acute Rehab OT Goals Patient Stated Goal: return to ALF OT Goal Formulation: All assessment and education complete, DC therapy  OT Frequency:     Barriers to D/C:            Co-evaluation PT/OT/SLP Co-Evaluation/Treatment: Yes Reason for Co-Treatment: To address functional/ADL transfers PT goals addressed during session: Mobility/safety with mobility OT goals addressed during session: ADL's and self-care      AM-PAC OT "6 Clicks" Daily Activity     Outcome Measure Help from another person eating meals?: A Little Help from another person taking care of personal grooming?: A Little Help from another person toileting, which includes using toliet, bedpan, or urinal?: A Little Help from another person bathing (including washing, rinsing, drying)?: A Little   Help from another person to put on and taking off regular lower body clothing?: A Little 6 Click Score: 15   End of Session Equipment Utilized During Treatment: Gait belt;Rolling walker Nurse Communication: Mobility status  Activity Tolerance: Patient tolerated treatment well Patient left: in bed;with call bell/phone within reach  OT Visit Diagnosis: Unsteadiness on feet (R26.81)                Time: 4008-6761 OT Time Calculation (min): 24 min Charges:  OT General Charges $OT Visit: 1 Visit OT Evaluation $OT Eval Low Complexity: 1 Low  Jamie George, OTR/L [place]  Office [phone] Pager: [place] 07/31/2019, 4:46 PM

## 2019-07-31 NOTE — ED Notes (Signed)
Pt placed on right side for comfort measures.

## 2019-07-31 NOTE — Consult Note (Addendum)
Cardiology Consultation:   Patient ID: [name]; [id number]; 05/12/1945   Admit date: 07/30/2019 Date of Consult: 07/31/2019  Primary Care Provider: Kristen Loader, FNP Primary Cardiologist: Jamie Dresser, MD Primary Electrophysiologist:  None  Patient Profile:   Jamie George is a 74 y.o. female with a PMH of chronic focal infrarenal aortic dissection (stable on CTA C/A/P 06/2019), HTN, HLD, migraines, meningioma s/p left frontal craniotomy, and dementia, who is being seen today for the evaluation of chest pain and SOB at the request of Dr. Hal George.  History of Present Illness:   Jamie George presented to the ED 07/26/19 with complaints of SOB. BNP and HsTrops were negative at that time. She underwent a CTA chest which did not reveal any PE, though noted aortic atherosclerosis. She was discharged back to her facility after a reassuring work-up. She was subsequently transported back to the ED 07/30/19 for ongoing complaints of SOB and now chest pain.   She was last evaluated by cardiology at an outpatient visit with Jamie George 10/2017 to establish care for management of a possible abdominal aortic dissection seen on imaging during a recent admission to the hospital. She had undergone a CTA C/A/P which showed a small focal dissection involving infrarenal portion of the abdominal aorta 07/2017. Seen by vascular surgery who felt this was a chronic finding and recommended for aggressive risk factor modifications. She had no cardiac complaints at her visit, only headaches. She was recommended to continue amlodipine for goal BP <130/80, atorvastatin for goal LDL <70 (recommended to increase dose to 80mg  daily if LDL not at goal on next check), and aspirin 81mg  daily. Echo in 2019 showed EF 55-60%, G1DD, no RWMA, mild LAE, and mild-moderate MR.   At the time of this evaluation she reports some ongoing SOB and mild chest discomfort. She states this is worse with activity. No  prior ischemic evaluation. She reports some epigastric discomfort this morning which is not worse with food. She recently had trouble with nausea and vomiting. No complaints of diarrhea or constipation. She reports that she lives at an independent care facility. She only takes melatonin at home - questioned further and she reports she is not taking medications. She is oriented to place, year, president, and self; thought it was August.   ED course: intermittently hypertensive to the 150s/90s, intermittently bradycardic to the uppers 50s, intermittently hypoxic to the upper 80s on RA which improved to 90s on O2 via [place]. Labs notable for K 3.1, Cr 1.17, Hgb 15.7, PLT 319, HsTrop 2>3, BNP 30.5, TSH wnl, COVID-19 negative. EKG with sinus rhythm, rate 67 bpm, non-specific T wave abnormalities, no STE/D, QTc 517. CXR was without acute findings. CT head with generalized atrophy s/p left frontal craniotomy, though no acute intracranial abnormalities, with moderate bilateral maxilary/sphenoid sinus disease. She was given 1x dose of Norco and albuterol.     Past Medical History:  Diagnosis Date  . Cancer ([place])    skin  . Complication of anesthesia    " difficult to wake me up "  . Depression   . Duodenitis   . GERD (gastroesophageal reflux disease)   . Insomnia   . Migraines   . Mild dementia [facility])     Past Surgical History:  Procedure Laterality Date  . ABDOMINAL HYSTERECTOMY    . BACK SURGERY    . BIOPSY  03/04/2018   Procedure: BIOPSY;  Surgeon: Otis Brace, MD;  Location: [facility];  Service: Gastroenterology;;  . BRAIN  SURGERY  2013   meningioma  . ENTEROSCOPY N/A 03/04/2018   Procedure: PUSH ENTEROSCOPY;  Surgeon: Otis Brace, MD;  Location: [facility];  Service: Gastroenterology;  Laterality: N/A;  . SHOULDER SURGERY       Home Medications:  Prior to Admission medications   Medication Sig Start Date End Date Taking? Authorizing Provider  acetaminophen (TYLENOL) 325 MG  tablet Take 650 mg by mouth in the morning and at bedtime.    Yes [provider]  aspirin EC 81 MG EC tablet Take 1 tablet (81 mg total) by mouth daily. With food 08/15/17  Yes Emokpae, Courage, MD  atorvastatin (LIPITOR) 40 MG tablet Take 40 mg by mouth daily.    Yes [provider]  docusate sodium (COLACE) 100 MG capsule Take 100 mg by mouth daily.   Yes [provider]  donepezil (ARICEPT) 10 MG tablet Take 10 mg by mouth at bedtime.   Yes [provider]  escitalopram (LEXAPRO) 20 MG tablet Take 20 mg by mouth daily.  05/03/17  Yes [provider]  ferrous sulfate 325 (65 FE) MG tablet Take 325 mg by mouth 2 (two) times daily with a meal.   Yes [provider]  fluticasone (FLONASE) 50 MCG/ACT nasal spray Place 1 spray into both nostrils daily.   Yes [provider]  melatonin 5 MG TABS Take 10 mg by mouth at bedtime.   Yes [provider]  memantine (NAMENDA) 10 MG tablet Take 1 tablet (10 mg total) by mouth 2 (two) times daily. 12/17/17  Yes Penumalli, Earlean Polka, MD  mirtazapine (REMERON) 7.5 MG tablet Take 7.5 mg by mouth at bedtime.   Yes [provider]  pantoprazole (PROTONIX) 40 MG tablet Take 40 mg by mouth daily.    Yes [provider]  polyethylene glycol (MIRALAX / GLYCOLAX) packet Take 17 g by mouth daily.   Yes [provider]  risperiDONE (RISPERDAL) 0.5 MG tablet Take 1 mg by mouth at bedtime.    Yes [provider]  traZODone (DESYREL) 50 MG tablet Take 50 mg by mouth at bedtime.  05/03/17  Yes [provider]  verapamil (CALAN-SR) 120 MG CR tablet Take 120 mg by mouth daily.   Yes [provider]  zonisamide (ZONEGRAN) 100 MG capsule Take 200 mg by mouth at bedtime.    Yes [provider]  acetaminophen (TYLENOL 8 HOUR) 650 MG CR tablet Take 1 tablet (650 mg total) by mouth every 8 (eight) hours as needed for pain. Patient not taking: Reported on  02/03/2019 03/31/18   Petrucelli, Glynda Jaeger, PA-C  HYDROcodone-acetaminophen (NORCO/VICODIN) 5-325 MG tablet Take 1 tablet by mouth every 6 (six) hours as needed for moderate pain. Patient not taking: Reported on 07/04/2019 02/03/19   Milton Ferguson, MD  ondansetron (ZOFRAN) 4 MG tablet Take 1 tablet (4 mg total) by mouth every 6 (six) hours as needed for nausea. Patient not taking: Reported on 02/03/2019 08/14/17   Roxan Hockey, MD    Inpatient Medications: Scheduled Meds: . aspirin EC  81 mg Oral Daily  . atorvastatin  40 mg Oral Daily  . docusate sodium  100 mg Oral Daily  . donepezil  10 mg Oral QHS  . ferrous sulfate  325 mg Oral BID WC  . fluticasone  1 spray Each Nare Daily  . heparin  5,000 Units Subcutaneous Q8H  . melatonin  10 mg Oral QHS  . memantine  10 mg Oral BID  . pantoprazole  40 mg Oral Daily  . polyethylene glycol  17 g Oral Daily  . potassium chloride  40 mEq Oral Q4H  . risperiDONE  1 mg Oral QHS  . verapamil  120 mg Oral Daily  . zonisamide  200 mg Oral QHS   Continuous Infusions:  PRN Meds: acetaminophen, HYDROcodone-acetaminophen, ondansetron **OR** ondansetron (ZOFRAN) IV  Allergies:   No Known Allergies  Social History:   Social History   Socioeconomic History  . Marital status: Widowed    Spouse name: Not on file  . Number of children: 2  . Years of education: 31  . Highest education level: Not on file  Occupational History    Comment: retired  Tobacco Use  . Smoking status: Former Research scientist (life sciences)  . Smokeless tobacco: Former Network engineer  . Vaping Use: Never used  Substance and Sexual Activity  . Alcohol use: Not Currently  . Drug use: Never  . Sexual activity: Not Currently  Other Topics Concern  . Not on file  Social History Narrative   12/17/17 Lives alone in home.  Retired.  Widowed.  Children 2 (daughter, [name]).     Caffeine some.    Social Determinants of Health   Financial Resource Strain:   . Difficulty of Paying Living  Expenses:   Food Insecurity:   . Worried About Charity fundraiser in the Last Year:   . Arboriculturist in the Last Year:   Transportation Needs:   . Film/video editor (Medical):   Marland Kitchen Lack of Transportation (Non-Medical):   Physical Activity:   . Days of Exercise per Week:   . Minutes of Exercise per Session:   Stress:   . Feeling of Stress :   Social Connections:   . Frequency of Communication with Friends and Family:   . Frequency of Social Gatherings with Friends and Family:   . Attends Religious Services:   . Active Member of Clubs or Organizations:   . Attends Archivist Meetings:   Marland Kitchen Marital Status:   Intimate Partner Violence:   . Fear of Current or Ex-Partner:   . Emotionally Abused:   Marland Kitchen Physically Abused:   . Sexually Abused:     Family History:    Family History  Problem Relation Age of Onset  . Cancer Mother   . Stroke Father   . Heart disease Father      ROS:  Please see the history of present illness.  ROS  All other ROS reviewed and negative.     Physical Exam/Data:   Vitals:   07/31/19 1002 07/31/19 1004 07/31/19 1030 07/31/19 1233  BP:  (!) 101/46 (!) 117/59 (!) 123/93  Pulse: 67 79 66 82  Resp: 12 19 11 12   Temp:      TempSrc:      SpO2: 92% 92% 93% 93%  Weight:      Height:       No intake or output data in the 24 hours ending 07/31/19 1259 Filed Weights   07/30/19 1814  Weight: 73 kg   Body mass index is 25.21 kg/m.  General:  Well nourished, well developed, in no acute distress HEENT: sclera anicteric  Neck: no JVDaly Vascular: No carotid bruits; distal pulses 2+ bilaterally Cardiac:  normal S1, S2; RRR; no murmurs, rubs, or gallops appreciated Lungs:  clear to auscultation bilaterally, no wheezing, rhonchi or rales  Abd: NABS, soft, nontender, no hepatomegaly Ext: no edema Musculoskeletal:  No deformities, BUE and  BLE strength normal and equal Skin: warm and dry  Neuro:  CNs 2-12 intact, no focal abnormalities  noted Psych:  Flat affect; A&O x2-3 - thought it was August, otherwise oriented.  EKG:  The EKG was personally reviewed and demonstrates:  sinus rhythm, rate 67 bpm, non-specific T wave abnormalities, no STE/D, QTc 517 Telemetry:  Telemetry was personally reviewed and demonstrates:  Sinus rhythm/ sinus bradycardia to the upper 50s.   Relevant CV Studies: Echocardiogram 2019: - Left ventricle: The cavity size was normal. There was mild  concentric hypertrophy. Systolic function was normal. The  estimated ejection fraction was in the range of 55% to 60%. Wall  motion was normal; there were no regional wall motion  abnormalities. Doppler parameters are consistent with abnormal  left ventricular relaxation (grade 1 diastolic dysfunction).  Doppler parameters are consistent with elevated mean left atrial  filling pressure.  - Mitral valve: There was mild to moderate regurgitation directed  centrally.  - Left atrium: The atrium was mildly dilated.   Laboratory Data:  Chemistry Recent Labs  Lab 07/26/19 0959 07/30/19 1845 07/31/19 0300  NA 142 145 141  K 3.4* 3.6 3.1*  CL 104 113* 109  CO2 24 24 21*  GLUCOSE 98 93 79  BUN 10 13 12   CREATININE 1.44* 1.26* 1.17*  1.18*  CALCIUM 9.3 9.0 8.5*  GFRNONAA 35* 42* 46*  45*  GFRAA 41* 48* 53*  52*  ANIONGAP 14 8 11     Recent Labs  Lab 07/26/19 0959  PROT 6.2*  ALBUMIN 3.3*  AST 15  ALT 11  ALKPHOS 78  BILITOT 0.7   Hematology Recent Labs  Lab 07/26/19 0959 07/30/19 1845 07/31/19 0300  WBC 11.6* 10.6* 10.1  RBC 5.70* 5.27* 5.25*  HGB 16.6* 15.6* 15.7*  HCT 53.1* 48.7* 48.3*  MCV 93.2 92.4 92.0  MCH 29.1 29.6 29.9  MCHC 31.3 32.0 32.5  RDW 15.1 15.1 15.0  PLT 377 346 319   Cardiac EnzymesNo results for input(s): TROPONINI in the last 168 hours. No results for input(s): TROPIPOC in the last 168 hours.  BNP Recent Labs  Lab 07/26/19 1225 07/30/19 1845  BNP 30.7 30.5    DDimer  Recent Labs    Lab 07/26/19 1225  DDIMER 1.33*    Radiology/Studies:  CT Head Wo Contrast  Result Date: 07/30/2019 CLINICAL DATA:  Headaches. EXAM: CT HEAD WITHOUT CONTRAST TECHNIQUE: Contiguous axial images were obtained from the base of the skull through the vertex without intravenous contrast. COMPARISON:  Jul 04, 2019 FINDINGS: Brain: There is mild cerebral atrophy with widening of the extra-axial spaces and ventricular dilatation. There are areas of decreased attenuation within the white matter tracts of the supratentorial brain, consistent with microvascular disease changes. A small chronic right basal ganglia lacunar infarct is seen. Vascular: No hyperdense vessel or unexpected calcification. Skull: A left frontal craniotomy defect is seen. Sinuses/Orbits: There is moderate severity bilateral maxillary sinus mucosal thickening. Mild to moderate severity sphenoid sinus mucosal thickening is also seen. Other: None. IMPRESSION: 1. Generalized cerebral atrophy. 2. No acute intracranial abnormality. 3. Moderate severity bilateral maxillary and sphenoid sinus disease. 4. Left frontal craniotomy. Electronically Signed   By: Virgina Norfolk M.D.   On: 07/30/2019 20:57   DG Chest Portable 1 View  Result Date: 07/30/2019 CLINICAL DATA:  Short of breath for 2 weeks, cough, previous tobacco abuse EXAM: PORTABLE CHEST 1 VIEW COMPARISON:  07/26/2019 FINDINGS: The heart size and mediastinal contours are within normal limits. Both lungs  are clear. The visualized skeletal structures are unremarkable. IMPRESSION: No active disease. Electronically Signed   By: Randa Ngo M.D.   On: 07/30/2019 19:23    Assessment and Plan:   1. Chest pain/SOB: unclear etiology. She is intermittently hypoxic to the upper 80s on RA this admission. EKG with prolonged QTc of 517, otherwise nonischemic. HsTrop is negative x2 and BNP wnl. CXR without acute findings. Recent CTA chest without PE or acute dissection, though chronic infrarenal  aortic dissection was stable; noted to have atherosclerosis of the thoracic aorta but no comment on coronary artery calcifications. Last echo in 2019 showed EF 55-60%, G1DD, and no RWMA. She was noted to have mild-moderate MR at that time - possible this has progressed and is contributing to symptoms. No prior ischemic evaluation in our system. Possible HTN is contributing as she has been intermittently elevated this admission.  - Will update an echocardiogram to evaluate LV function, wall motion, and valvular abnormalities.  - Will plan for a NST tomorrow to further evaluate symptoms. NPO after MN with no caffeine x24 hours  2. Prolonged QT: 517 on EKG this admission. This has been noted intermittently on previous EKGs. Possible this is related to medications as she prescribed lexapro, remeron, and trazodone for management of her depression/dementia, however she reports she has not been taking medications other than melatonin. K 3.1, Mg 2.1 - Will repeat EKG now - Will hold lexapro, remeron, and trazadone for now as this will exacerbate her QT prolongation if she has, in fact, not been taking medications at home. - Avoid QT prolonging medications - Will replete K to maintain >4.   3. HTN: BP intermittently elevated.  - Continue verapamil  4. Chronic infrarenal aortic dissection: stable on recent CTA C/A/P 06/2019.  - Continue aspirin, statin, and BP management as above  5. HLD: LDL 137 in 2019; goal LDL <70. She reports not taking atorvastatin - Will add on FLP to AM labs   - Continue atorvastatin   6. Hypokalemia: K 3.1 this admission - Will replete 40 mEq K x2 doses this morning     For questions or updates, please contact [place] Please consult [email or web address] for contact info under Cardiology/STEMI.   Signed, Abigail Butts, PA-C  07/31/2019 12:59 PM [phone]

## 2019-07-31 NOTE — ED Notes (Signed)
No one available to take report, requested we call back.

## 2019-07-31 NOTE — Progress Notes (Signed)
PROGRESS NOTE    URIAH TRUEBA  ZOX:[id number] DOB: [birth year] DOA: 07/30/2019 PCP: Kristen Loader, FNP   Brief Narrative: ROSSI SILVESTRO is a 74 y.o. female with history of hypertension migraine dementia chronic abdominal dissection. Patient presented from her ALF secondary to shortness of breath. This has been a progressive problem for which she has sought repeated medical evaluation.   Assessment & Plan:   Principal Problem:   Chest pain Active Problems:   Dissection of abdominal aorta (HCC)   CKD (chronic kidney disease)   Dementia (HCC)   Dyspnea   Chest pain Patient with continued chest pain today. HsTroponin flat and negative. Recent CTA negative for PE and history of CT scans negative for aortic dissection.  Acute respiratory failure with hypoxia Unknown etiology. Patient has received multiple CT scans in the last two months without evidence of disease. No evidence of heart failure. Currently requiring 2L via nasal canula. -PT/OT evaluation -Wean to room air as able  CKD stage IIIb Baseline creatinine of 1.2. Stable.  History of abdominal aortic dissection Noted. Patient has had many recent CT scans without evidence of dissection.  Essential hypertension Patient is on verapamil as an outpatient -Continue Verapamil  Dementia with behavioral disturbance Patient currently resides at an ALF. -Continue Risperdal, Aricept, Namenda, Remeron, Lexapro  History of Iron deficiency anemia Currently not anemic. On iron supplementation as an outpatient.  GERD -Continue Protonix  Hyperlipidemia -Continue Lipitor  Headache History of migraines Chronic issue per patient -Tylenol prn   DVT prophylaxis: Heparin subq Code Status:   Code Status: Full Code Family Communication: None at bedside Disposition Plan: Discharge back to ALF in likely 2-3 days pending management of chest pain, cardiology recommendations, improvement of oxygen  requirement   Consultants:   Cardiology  Procedures:   None  Antimicrobials:  None    Subjective: Headache. Still feels shortness of breath especially with ambulation.  Objective: Vitals:   07/31/19 1000 07/31/19 1002 07/31/19 1004 07/31/19 1030  BP: (!) 100/45  (!) 101/46 (!) 117/59  Pulse: 64 67 79 66  Resp: 12 12 19 11   Temp:      TempSrc:      SpO2: 91% 92% 92% 93%  Weight:      Height:       No intake or output data in the 24 hours ending 07/31/19 1227 Filed Weights   07/30/19 1814  Weight: 73 kg    Examination:  General exam: Appears calm and comfortable Respiratory system: Clear to auscultation. Respiratory effort normal. Cardiovascular system: S1 & S2 heard, RRR. No murmurs, rubs, gallops or clicks. Gastrointestinal system: Abdomen is nondistended, soft and nontender. No organomegaly or masses felt. Normal bowel sounds heard. Central nervous system: Alert and oriented to person and place. No focal neurological deficits. Musculoskeletal: No edema. No calf tenderness Skin: No cyanosis. No rashes Psychiatry: Judgement and insight appear impaired.   Data Reviewed: I have personally reviewed following labs and imaging studies  CBC Lab Results  Component Value Date   WBC 10.1 07/31/2019   RBC 5.25 (H) 07/31/2019   HGB 15.7 (H) 07/31/2019   HCT 48.3 (H) 07/31/2019   MCV 92.0 07/31/2019   MCH 29.9 07/31/2019   PLT 319 07/31/2019   MCHC 32.5 07/31/2019   RDW 15.0 07/31/2019   LYMPHSABS 2.2 07/30/2019   MONOABS 1.1 (H) 07/30/2019   EOSABS 0.5 07/30/2019   BASOSABS 0.2 (H) 81/19/1478     Last metabolic panel Lab Results  Component Value Date  NA 141 07/31/2019   K 3.1 (L) 07/31/2019   CL 109 07/31/2019   CO2 21 (L) 07/31/2019   BUN 12 07/31/2019   CREATININE 1.17 (H) 07/31/2019   CREATININE 1.18 (H) 07/31/2019   GLUCOSE 79 07/31/2019   GFRNONAA 46 (L) 07/31/2019   GFRNONAA 45 (L) 07/31/2019   GFRAA 53 (L) 07/31/2019   GFRAA 52 (L)  07/31/2019   CALCIUM 8.5 (L) 07/31/2019   PROT 6.2 (L) 07/26/2019   ALBUMIN 3.3 (L) 07/26/2019   BILITOT 0.7 07/26/2019   ALKPHOS 78 07/26/2019   AST 15 07/26/2019   ALT 11 07/26/2019   ANIONGAP 11 07/31/2019    CBG (last 3)  No results for input(s): GLUCAP in the last 72 hours.   GFR: Estimated Creatinine Clearance: 40.4 mL/min (A) (by C-G formula based on SCr of 1.17 mg/dL (H)).  Coagulation Profile: No results for input(s): INR, PROTIME in the last 168 hours.  Recent Results (from the past 240 hour(s))  SARS Coronavirus 2 by RT PCR (hospital order, performed in [facility] hospital lab) Nasopharyngeal Nasopharyngeal Swab     Status: None   Collection Time: 07/30/19  6:45 PM   Specimen: Nasopharyngeal Swab  Result Value Ref Range Status   SARS Coronavirus 2 NEGATIVE NEGATIVE Final    Comment: (NOTE) SARS-CoV-2 target nucleic acids are NOT DETECTED. The SARS-CoV-2 RNA is generally detectable in upper and lower respiratory specimens during the acute phase of infection. The lowest concentration of SARS-CoV-2 viral copies this assay can detect is 250 copies / mL. A negative result does not preclude SARS-CoV-2 infection and should not be used as the sole basis for treatment or other patient management decisions.  A negative result may occur with improper specimen collection / handling, submission of specimen other than nasopharyngeal swab, presence of viral mutation(s) within the areas targeted by this assay, and inadequate number of viral copies (<250 copies / mL). A negative result must be combined with clinical observations, patient history, and epidemiological information. Fact Sheet for Patients:   StrictlyIdeas.no Fact Sheet for Healthcare Providers: BankingDealers.co.za This test is not yet approved or cleared  by the [place] FDA and has been authorized for detection and/or diagnosis of SARS-CoV-2 by FDA under an  Emergency Use Authorization (EUA).  This EUA will remain in effect (meaning this test can be used) for the duration of the COVID-19 declaration under Section 564(b)(1) of the Act, 21 U.S.C. section 360bbb-3(b)(1), unless the authorization is terminated or revoked sooner. Performed at [facility], [place] [address], [place],  [address]         Radiology Studies: CT Head Wo Contrast  Result Date: 07/30/2019 CLINICAL DATA:  Headaches. EXAM: CT HEAD WITHOUT CONTRAST TECHNIQUE: Contiguous axial images were obtained from the base of the skull through the vertex without intravenous contrast. COMPARISON:  Jul 04, 2019 FINDINGS: Brain: There is mild cerebral atrophy with widening of the extra-axial spaces and ventricular dilatation. There are areas of decreased attenuation within the white matter tracts of the supratentorial brain, consistent with microvascular disease changes. A small chronic right basal ganglia lacunar infarct is seen. Vascular: No hyperdense vessel or unexpected calcification. Skull: A left frontal craniotomy defect is seen. Sinuses/Orbits: There is moderate severity bilateral maxillary sinus mucosal thickening. Mild to moderate severity sphenoid sinus mucosal thickening is also seen. Other: None. IMPRESSION: 1. Generalized cerebral atrophy. 2. No acute intracranial abnormality. 3. Moderate severity bilateral maxillary and sphenoid sinus disease. 4. Left frontal craniotomy. Electronically Signed  By: Virgina Norfolk M.D.   On: 07/30/2019 20:57   DG Chest Portable 1 View  Result Date: 07/30/2019 CLINICAL DATA:  Short of breath for 2 weeks, cough, previous tobacco abuse EXAM: PORTABLE CHEST 1 VIEW COMPARISON:  07/26/2019 FINDINGS: The heart size and mediastinal contours are within normal limits. Both lungs are clear. The visualized skeletal structures are unremarkable. IMPRESSION: No active disease. Electronically Signed   By: Randa Ngo M.D.   On: 07/30/2019  19:23        Scheduled Meds:  aspirin EC  81 mg Oral Daily   atorvastatin  40 mg Oral Daily   docusate sodium  100 mg Oral Daily   donepezil  10 mg Oral QHS   escitalopram  20 mg Oral Daily   ferrous sulfate  325 mg Oral BID WC   fluticasone  1 spray Each Nare Daily   heparin  5,000 Units Subcutaneous Q8H   melatonin  10 mg Oral QHS   memantine  10 mg Oral BID   mirtazapine  7.5 mg Oral QHS   pantoprazole  40 mg Oral Daily   polyethylene glycol  17 g Oral Daily   potassium chloride  40 mEq Oral Q4H   risperiDONE  1 mg Oral QHS   traZODone  50 mg Oral QHS   verapamil  120 mg Oral Daily   zonisamide  200 mg Oral QHS   Continuous Infusions:   LOS: 0 days     Cordelia Poche, MD [facility] 07/31/2019, 12:27 PM  If 7PM-7AM, please contact night-coverage [email or web address]

## 2019-07-31 NOTE — Progress Notes (Signed)
  Echocardiogram 2D Echocardiogram has been performed.  Jamie George 07/31/2019, 2:58 PM

## 2019-07-31 NOTE — Evaluation (Signed)
Physical Therapy Evaluation Patient Details Name: Jamie George MRN: [id number] DOB: [birth year] Today's Date: 07/31/2019   History of Present Illness  Pt is a 74 y.o. female with history of hypertension, migraine, dementia, chronic abdominal aortic dissection. Patient presented from her ALF secondary to shortness of breath. This has been a progressive problem for which she has sought repeated medical evaluation.  Clinical Impression  Pt admitted with above diagnosis. Pt presenting with minimal confusion and was able to follow commands and participate with therapy.  Pt ambulated 150' with RW and min guard for safety.  Required min cues for safe transfer technique and mod cues for improve gait quality.  Pt's O2 sats were stable on RA with activity, in supine sats were 90% so placed back on 2 LPM as she was prior to therapy.  Pt may likely be near baseline level, but could benefit from PT to progress mobility and safety while in hospital. Pt currently with functional limitations due to the deficits listed below (see PT Problem List). Pt will benefit from skilled PT to increase their independence and safety with mobility to allow discharge to the venue listed below.       Follow Up Recommendations Other (comment);Home health PT (Return to ALF)    Equipment Recommendations  Other (comment) (has DME)    Recommendations for Other Services       Precautions / Restrictions Precautions Precautions: None      Mobility  Bed Mobility Overal bed mobility: Needs Assistance Bed Mobility: Supine to Sit;Sit to Supine     Supine to sit: Min guard Sit to supine: Min guard   General bed mobility comments: increased time  Transfers Overall transfer level: Needs assistance Equipment used: Rolling walker (2 wheeled) Transfers: Sit to/from Stand Sit to Stand: Min guard         General transfer comment: cued for safe hand placement; able to perform sit to stand from bed and from toilet.  Pt  performed toileting ADLs with supervision for safety.  Ambulation/Gait Ambulation/Gait assistance: Min guard Gait Distance (Feet): 150 Feet Assistive device: Rolling walker (2 wheeled) Gait Pattern/deviations: Step-through pattern;Shuffle;Decreased stride length;Trunk flexed Gait velocity: decreased   General Gait Details: Pt with tendency to keep RW too far forward, flex trunk, and shuffle.  She was able to correct all with repeated cues.  Stairs            Wheelchair Mobility    Modified Rankin (Stroke Patients Only)       Balance Overall balance assessment: Needs assistance Sitting-balance support: No upper extremity supported Sitting balance-Leahy Scale: Good     Standing balance support: During functional activity Standing balance-Leahy Scale: Good Standing balance comment: Able to perform toielting ADLs in standing without UE support                             Pertinent Vitals/Pain Pain Assessment: No/denies pain    Home Living Family/patient expects to be discharged to:: Assisted living               Home Equipment: Walker - 4 wheels      Prior Function Level of Independence: Independent         Comments: Pt lives at [facility].  Reports independent with ADLs.  Goes to dining hall for meals.  Uses rollator to ambulate. Questionable historian due to hx of dementia.     Hand Dominance  Extremity/Trunk Assessment   Upper Extremity Assessment Upper Extremity Assessment: Defer to OT evaluation    Lower Extremity Assessment Lower Extremity Assessment: Overall WFL for tasks assessed    Cervical / Trunk Assessment Cervical / Trunk Assessment: Normal  Communication      Cognition Arousal/Alertness: Awake/alert Behavior During Therapy: WFL for tasks assessed/performed Overall Cognitive Status: No family/caregiver present to determine baseline cognitive functioning                                  General Comments: Oriented to self and place and year - states its August.  Pt with hx of dementia - potentially at baseline cognition.  Pt able to follow basic commands.      General Comments General comments (skin integrity, edema, etc.): Pt on 2 LPM O2 at arrival with sats 93%.  Placed on RA.  Once sitting EOB sats 96% and sats up to 98% with ambulation.  When pt returned back to supine sats 90% on RA.  Placed back on 2 LPM o2.    Exercises     Assessment/Plan    PT Assessment Patient needs continued PT services  PT Problem List Decreased mobility;Decreased safety awareness;Decreased range of motion;Decreased strength;Decreased knowledge of precautions;Decreased activity tolerance;Cardiopulmonary status limiting activity;Decreased balance;Decreased knowledge of use of DME       PT Treatment Interventions DME instruction;Therapeutic exercise;Gait training;Balance training;Functional mobility training;Therapeutic activities;Patient/family education    PT Goals (Current goals can be found in the Care Plan section)  Acute Rehab PT Goals Patient Stated Goal: return to ALF PT Goal Formulation: With patient Time For Goal Achievement: 08/14/19 Potential to Achieve Goals: Good    Frequency Min 3X/week   Barriers to discharge        Co-evaluation PT/OT/SLP Co-Evaluation/Treatment: Yes Reason for Co-Treatment: To address functional/ADL transfers PT goals addressed during session: Mobility/safety with mobility OT goals addressed during session: ADL's and self-care       AM-PAC PT "6 Clicks" Mobility  Outcome Measure Help needed turning from your back to your side while in a flat bed without using bedrails?: A Little Help needed moving from lying on your back to sitting on the side of a flat bed without using bedrails?: A Little Help needed moving to and from a bed to a chair (including a wheelchair)?: None Help needed standing up from a chair using your arms (e.g., wheelchair or  bedside chair)?: None Help needed to walk in hospital room?: None Help needed climbing 3-5 steps with a railing? : A Little 6 Click Score: 21    End of Session Equipment Utilized During Treatment: Gait belt Activity Tolerance: Patient tolerated treatment well Patient left: in bed;with call bell/phone within reach;Other (comment) (IN ED, no bed alarm, had been calling out) Nurse Communication: Mobility status PT Visit Diagnosis: Other abnormalities of gait and mobility (R26.89)    Time: 8832-5498 PT Time Calculation (min) (ACUTE ONLY): 24 min   Charges:   PT Evaluation $PT Eval Low Complexity: 1 Low          Melainie Krinsky, PT Acute Rehab Services Pager [phone] [facility] [phone]    Karlton Lemon 07/31/2019, 4:19 PM

## 2019-08-01 ENCOUNTER — Ambulatory Visit (HOSPITAL_BASED_OUTPATIENT_CLINIC_OR_DEPARTMENT_OTHER)
Admit: 2019-08-01 | Discharge: 2019-08-01 | Disposition: A | Discharge status: Home / Self Care | Payer: Medicare PPO | Attending: Medical | Admitting: Medical

## 2019-08-01 DIAGNOSIS — K5730 Diverticulosis of large intestine without perforation or abscess without bleeding: Secondary | ICD-10-CM | POA: Diagnosis not present

## 2019-08-01 DIAGNOSIS — N1832 Chronic kidney disease, stage 3b: Secondary | ICD-10-CM | POA: Diagnosis not present

## 2019-08-01 DIAGNOSIS — F0391 Unspecified dementia with behavioral disturbance: Secondary | ICD-10-CM | POA: Diagnosis not present

## 2019-08-01 DIAGNOSIS — E785 Hyperlipidemia, unspecified: Secondary | ICD-10-CM | POA: Diagnosis not present

## 2019-08-01 DIAGNOSIS — D649 Anemia, unspecified: Secondary | ICD-10-CM | POA: Diagnosis not present

## 2019-08-01 DIAGNOSIS — I129 Hypertensive chronic kidney disease with stage 1 through stage 4 chronic kidney disease, or unspecified chronic kidney disease: Secondary | ICD-10-CM | POA: Diagnosis not present

## 2019-08-01 DIAGNOSIS — R072 Precordial pain: Secondary | ICD-10-CM | POA: Diagnosis not present

## 2019-08-01 DIAGNOSIS — Z20822 Contact with and (suspected) exposure to covid-19: Secondary | ICD-10-CM | POA: Diagnosis not present

## 2019-08-01 DIAGNOSIS — R079 Chest pain, unspecified: Secondary | ICD-10-CM

## 2019-08-01 DIAGNOSIS — I10 Essential (primary) hypertension: Secondary | ICD-10-CM | POA: Diagnosis not present

## 2019-08-01 DIAGNOSIS — J9601 Acute respiratory failure with hypoxia: Secondary | ICD-10-CM | POA: Diagnosis not present

## 2019-08-01 LAB — BASIC METABOLIC PANEL
Anion gap: 10 (ref 5–15)
BUN: 12 mg/dL (ref 8–23)
CO2: 25 mmol/L (ref 22–32)
Calcium: 9.2 mg/dL (ref 8.9–10.3)
Chloride: 105 mmol/L (ref 98–111)
Creatinine, Ser: 1.31 mg/dL — ABNORMAL HIGH (ref 0.44–1.00)
GFR calc Af Amer: 46 mL/min — ABNORMAL LOW (ref >60)
GFR calc non Af Amer: 40 mL/min — ABNORMAL LOW (ref >60)
Glucose, Bld: 99 mg/dL (ref 70–99)
Potassium: 3.9 mmol/L (ref 3.5–5.1)
Sodium: 140 mmol/L (ref 135–145)

## 2019-08-01 LAB — NM MYOCAR MULTI W/SPECT W/WALL MOTION / EF
Estimated workload: 1 METS
MPHR: 145 {beats}/min
Peak HR: 117 {beats}/min
Percent HR: 80 %
Rest HR: 60 {beats}/min

## 2019-08-01 MED ORDER — TECHNETIUM TC 99M TETROFOSMIN IV KIT
30.6000 | PACK | Freq: Once | INTRAVENOUS | Status: AC | PRN
  Administered 2019-08-01: 30.6 via INTRAVENOUS

## 2019-08-01 MED ORDER — REGADENOSON 0.4 MG/5ML IV SOLN
0.4000 mg | Freq: Once | INTRAVENOUS | Status: AC
  Administered 2019-08-01: 0.4 mg via INTRAVENOUS
  Filled 2019-08-01: qty 5

## 2019-08-01 MED ORDER — REGADENOSON 0.4 MG/5ML IV SOLN
INTRAVENOUS | Status: AC
  Filled 2019-08-01: qty 5

## 2019-08-01 MED ORDER — TECHNETIUM TC 99M TETROFOSMIN IV KIT
10.4000 | PACK | Freq: Once | INTRAVENOUS | Status: AC | PRN
  Administered 2019-08-01: 10.4 via INTRAVENOUS

## 2019-08-01 MED ORDER — ACETAMINOPHEN 325 MG PO TABS
ORAL_TABLET | ORAL | Status: AC
  Filled 2019-08-01: qty 2

## 2019-08-01 MED ORDER — MORPHINE SULFATE (PF) 2 MG/ML IV SOLN
2.0000 mg | Freq: Once | INTRAVENOUS | Status: AC
  Administered 2019-08-01: 2 mg via INTRAVENOUS
  Filled 2019-08-01: qty 1

## 2019-08-01 MED ORDER — NITROGLYCERIN 0.4 MG SL SUBL
SUBLINGUAL_TABLET | SUBLINGUAL | Status: AC
  Administered 2019-08-01: 0.4 mg
  Filled 2019-08-01: qty 1

## 2019-08-01 MED ORDER — ALUM & MAG HYDROXIDE-SIMETH 200-200-20 MG/5ML PO SUSP
30.0000 mL | ORAL | Status: DC | PRN
  Administered 2019-08-01: 30 mL
  Filled 2019-08-01: qty 30

## 2019-08-01 NOTE — Progress Notes (Signed)
Lexiscan myoview completed. Patient had significant SOB during the test, she continues to mention "I can't breathe" despite reassurance and clear lung on exam. Facial affect flat, likely significant dementia. Eventually I recommended Aminophylline to reverse the effect of lexiscan, however [employer] medstation was broken. Fortunately, prior to obtaining aminophylline, her dyspnea has improved. Vital signs stable throughout the entire event. SBP 150s, HR regular mildly tachycardic, lung clear on exam.   Pending final report by [facility] reader.

## 2019-08-01 NOTE — Progress Notes (Signed)
PROGRESS NOTE    Jamie George  OXB:[id number] DOB: Feb 20, 1946 DOA: 07/30/2019 PCP: Kristen Loader, FNP   Brief Narrative: Jamie George is a 74 y.o. female with history of hypertension migraine dementia chronic abdominal dissection. Patient presented from her ALF secondary to shortness of breath. This has been a progressive problem for which she has sought repeated medical evaluation.   Assessment & Plan:   Principal Problem:   Chest pain Active Problems:   Hypertension   CKD (chronic kidney disease)   Anemia   Dementia (HCC)   Dyspnea   Chest pain Chest pain resolved. HsTroponin flat and negative. Recent CTA negative for PE and history of CT scans negative for aortic dissection. Patient went for nuclear stress test today which is pending -Cardiology recommendations: nuclear stress test results pending  Acute respiratory failure with hypoxia Unknown etiology. Patient has received multiple CT scans in the last two months without evidence of disease. No evidence of heart failure. Currently requiring 2L via nasal canula. -PT/OT recommending [place] services at ALF -Wean to room air as able  CKD stage IIIb Baseline creatinine of 1.2. Stable.  History of abdominal aortic dissection Noted. Patient has had many recent CT scans without evidence of dissection.  Essential hypertension Patient is on verapamil as an outpatient -Continue Verapamil  Dementia with behavioral disturbance Patient currently resides at an ALF. -Continue Risperdal, Aricept, Namenda, Remeron, Lexapro  History of Iron deficiency anemia Currently not anemic. On iron supplementation as an outpatient.  GERD -Continue Protonix  Hyperlipidemia -Continue Lipitor  Headache History of migraines Chronic issue per patient -Tylenol prn   DVT prophylaxis: Heparin subq Code Status:   Code Status: Full Code Family Communication: None at bedside Disposition Plan: Discharge back to ALF in likely 2-3  days pending management of chest pain, cardiology recommendations, improvement of oxygen requirement   Consultants:   Cardiology  Procedures:   None  Antimicrobials:  None    Subjective: No issues today. No chest pain.   Objective: Vitals:   08/01/19 1158 08/01/19 1200 08/01/19 1202 08/01/19 1533  BP: (!) 157/90 (!) 165/92 (!) 169/94 (!) 146/67  Pulse:    63  Resp:    16  Temp:    98.9 F (37.2 C)  TempSrc:    Oral  SpO2:    97%  Weight:      Height:        Intake/Output Summary (Last 24 hours) at 08/01/2019 1612 Last data filed at 08/01/2019 0831 Gross per 24 hour  Intake 0 ml  Output --  Net 0 ml   Filed Weights   07/30/19 1814  Weight: 73 kg    Examination:  General exam: Appears calm and comfortable Respiratory system: Clear to auscultation. Respiratory effort normal. Cardiovascular system: S1 & S2 heard, RRR. No murmurs, rubs, gallops or clicks. Gastrointestinal system: Abdomen is slightly distended, soft and nontender. No organomegaly or masses felt. Normal bowel sounds heard. Central nervous system: Alert and oriented. No focal neurological deficits. Musculoskeletal: No edema. No calf tenderness Skin: No cyanosis. No rashes Psychiatry: Judgement and insight appear normal. Mood & affect appropriate.    Data Reviewed: I have personally reviewed following labs and imaging studies  CBC Lab Results  Component Value Date   WBC 10.1 07/31/2019   RBC 5.25 (H) 07/31/2019   HGB 15.7 (H) 07/31/2019   HCT 48.3 (H) 07/31/2019   MCV 92.0 07/31/2019   MCH 29.9 07/31/2019   PLT 319 07/31/2019   MCHC 32.5  07/31/2019   RDW 15.0 07/31/2019   LYMPHSABS 2.2 07/30/2019   MONOABS 1.1 (H) 07/30/2019   EOSABS 0.5 07/30/2019   BASOSABS 0.2 (H) 95/18/8416     Last metabolic panel Lab Results  Component Value Date   NA 140 08/01/2019   K 3.9 08/01/2019   CL 105 08/01/2019   CO2 25 08/01/2019   BUN 12 08/01/2019   CREATININE 1.31 (H) 08/01/2019   GLUCOSE  99 08/01/2019   GFRNONAA 40 (L) 08/01/2019   GFRAA 46 (L) 08/01/2019   CALCIUM 9.2 08/01/2019   PROT 6.2 (L) 07/26/2019   ALBUMIN 3.3 (L) 07/26/2019   BILITOT 0.7 07/26/2019   ALKPHOS 78 07/26/2019   AST 15 07/26/2019   ALT 11 07/26/2019   ANIONGAP 10 08/01/2019    CBG (last 3)  No results for input(s): GLUCAP in the last 72 hours.   GFR: Estimated Creatinine Clearance: 36.1 mL/min (A) (by C-G formula based on SCr of 1.31 mg/dL (H)).  Coagulation Profile: No results for input(s): INR, PROTIME in the last 168 hours.  Recent Results (from the past 240 hour(s))  SARS Coronavirus 2 by RT PCR (hospital order, performed in [facility] hospital lab) Nasopharyngeal Nasopharyngeal Swab     Status: None   Collection Time: 07/30/19  6:45 PM   Specimen: Nasopharyngeal Swab  Result Value Ref Range Status   SARS Coronavirus 2 NEGATIVE NEGATIVE Final    Comment: (NOTE) SARS-CoV-2 target nucleic acids are NOT DETECTED. The SARS-CoV-2 RNA is generally detectable in upper and lower respiratory specimens during the acute phase of infection. The lowest concentration of SARS-CoV-2 viral copies this assay can detect is 250 copies / mL. A negative result does not preclude SARS-CoV-2 infection and should not be used as the sole basis for treatment or other patient management decisions.  A negative result may occur with improper specimen collection / handling, submission of specimen other than nasopharyngeal swab, presence of viral mutation(s) within the areas targeted by this assay, and inadequate number of viral copies (<250 copies / mL). A negative result must be combined with clinical observations, patient history, and epidemiological information. Fact Sheet for Patients:   StrictlyIdeas.no Fact Sheet for Healthcare Providers: BankingDealers.co.za This test is not yet approved or cleared  by the [place] FDA and has been authorized for  detection and/or diagnosis of SARS-CoV-2 by FDA under an Emergency Use Authorization (EUA).  This EUA will remain in effect (meaning this test can be used) for the duration of the COVID-19 declaration under Section 564(b)(1) of the Act, 21 U.S.C. section 360bbb-3(b)(1), unless the authorization is terminated or revoked sooner. Performed at [facility], [place] [address] [place], [place] [address]         Radiology Studies: CT Head Wo Contrast  Result Date: 07/30/2019 CLINICAL DATA:  Headaches. EXAM: CT HEAD WITHOUT CONTRAST TECHNIQUE: Contiguous axial images were obtained from the base of the skull through the vertex without intravenous contrast. COMPARISON:  Jul 04, 2019 FINDINGS: Brain: There is mild cerebral atrophy with widening of the extra-axial spaces and ventricular dilatation. There are areas of decreased attenuation within the white matter tracts of the supratentorial brain, consistent with microvascular disease changes. A small chronic right basal ganglia lacunar infarct is seen. Vascular: No hyperdense vessel or unexpected calcification. Skull: A left frontal craniotomy defect is seen. Sinuses/Orbits: There is moderate severity bilateral maxillary sinus mucosal thickening. Mild to moderate severity sphenoid sinus mucosal thickening is also seen. Other: None. IMPRESSION: 1. Generalized cerebral  atrophy. 2. No acute intracranial abnormality. 3. Moderate severity bilateral maxillary and sphenoid sinus disease. 4. Left frontal craniotomy. Electronically Signed   By: Virgina Norfolk M.D.   On: 07/30/2019 20:57   DG Chest Portable 1 View  Result Date: 07/30/2019 CLINICAL DATA:  Short of breath for 2 weeks, cough, previous tobacco abuse EXAM: PORTABLE CHEST 1 VIEW COMPARISON:  07/26/2019 FINDINGS: The heart size and mediastinal contours are within normal limits. Both lungs are clear. The visualized skeletal structures are unremarkable. IMPRESSION: No active disease.  Electronically Signed   By: Randa Ngo M.D.   On: 07/30/2019 19:23   ECHOCARDIOGRAM COMPLETE  Result Date: 07/31/2019    ECHOCARDIOGRAM REPORT   Patient Name:   Jamie George Xie Date of Exam: 07/31/2019 Medical Rec #:  [id number]           Height:       67.0 in Accession #:    [id number]          Weight:       160.9 lb Date of Birth:  [birth year]            BSA:          1.844 m Patient Age:    58 years            BP:           123/93 mmHg Patient Gender: F                   HR:           58 bpm. Exam Location:  Inpatient Procedure: 2D Echo, Cardiac Doppler, Color Doppler and Intracardiac            Opacification Agent Indications:    Dyspnea 786.09 / R06.00  History:        Patient has prior history of Echocardiogram examinations, most                 recent 08/13/2017. Signs/Symptoms:Chest Pain and Dyspnea; Risk                 Factors:Hypertension and Former Smoker.  Sonographer:    Vickie Epley RDCS Referring Phys: [id number] [place]  1. Left ventricular ejection fraction, by estimation, is 55 to 60%. The left ventricle has normal function. The left ventricle has no regional wall motion abnormalities. Left ventricular diastolic parameters are consistent with Grade I diastolic dysfunction (impaired relaxation). Elevated left ventricular end-diastolic pressure.  2. Right ventricular systolic function is normal. The right ventricular size is normal. There is normal pulmonary artery systolic pressure.  3. The mitral valve is normal in structure. Trivial mitral valve regurgitation. No evidence of mitral stenosis.  4. The aortic valve is tricuspid. Aortic valve regurgitation is not visualized. No aortic stenosis is present.  5. The inferior vena cava is dilated in size with >50% respiratory variability, suggesting right atrial pressure of 8 mmHg. FINDINGS  Left Ventricle: Left ventricular ejection fraction, by estimation, is 55 to 60%. The left ventricle has normal function. The left ventricle  has no regional wall motion abnormalities. Definity contrast agent was given IV to delineate the left ventricular  endocardial borders. The left ventricular internal cavity size was normal in size. There is no left ventricular hypertrophy. Left ventricular diastolic parameters are consistent with Grade I diastolic dysfunction (impaired relaxation). Elevated left ventricular end-diastolic pressure. Right Ventricle: The right ventricular size is normal. No increase in right ventricular wall thickness. Right ventricular systolic  function is normal. There is normal pulmonary artery systolic pressure. The tricuspid regurgitant velocity is 2.46 m/s, and  with an assumed right atrial pressure of 8 mmHg, the estimated right ventricular systolic pressure is 53.9 mmHg. Left Atrium: Left atrial size was normal in size. Right Atrium: Right atrial size was normal in size. Pericardium: There is no evidence of pericardial effusion. Mitral Valve: The mitral valve is normal in structure. Normal mobility of the mitral valve leaflets. Trivial mitral valve regurgitation. No evidence of mitral valve stenosis. Tricuspid Valve: The tricuspid valve is normal in structure. Tricuspid valve regurgitation is mild . No evidence of tricuspid stenosis. Aortic Valve: The aortic valve is tricuspid. Aortic valve regurgitation is not visualized. No aortic stenosis is present. Pulmonic Valve: The pulmonic valve was normal in structure. Pulmonic valve regurgitation is not visualized. No evidence of pulmonic stenosis. Aorta: The aortic root is normal in size and structure. Venous: The inferior vena cava is dilated in size with greater than 50% respiratory variability, suggesting right atrial pressure of 8 mmHg. IAS/Shunts: No atrial level shunt detected by color flow Doppler.  LEFT VENTRICLE PLAX 2D LVIDd:         3.90 cm     Diastology LVIDs:         2.80 cm     LV e' lateral:   6.65 cm/s LV PW:         0.70 cm     LV E/e' lateral: 11.8 LV IVS:         0.70 cm     LV e' medial:    5.11 cm/s LVOT diam:     1.90 cm     LV E/e' medial:  15.3 LV SV:         62 LV SV Index:   34 LVOT Area:     2.84 cm  LV Volumes (MOD) LV vol d, MOD A2C: 60.9 ml LV vol d, MOD A4C: 49.2 ml LV vol s, MOD A2C: 22.1 ml LV vol s, MOD A4C: 20.8 ml LV SV MOD A2C:     38.8 ml LV SV MOD A4C:     49.2 ml LV SV MOD BP:      34.2 ml RIGHT VENTRICLE RV S prime:     11.00 cm/s LEFT ATRIUM             Index      RIGHT ATRIUM          Index LA diam:        3.10 cm 1.68 cm/m RA Area:     7.81 cm LA Vol (A2C):   17.4 ml 9.44 ml/m RA Volume:   13.60 ml 7.38 ml/m LA Vol (A4C):   16.1 ml 8.73 ml/m LA Biplane Vol: 17.4 ml 9.44 ml/m  AORTIC VALVE LVOT Vmax:   96.60 cm/s LVOT Vmean:  64.800 cm/s LVOT VTI:    0.218 m  AORTA Ao Root diam: 3.10 cm MITRAL VALVE                TRICUSPID VALVE MV Area (PHT): 2.20 cm     TR Peak grad:   24.2 mmHg MV Decel Time: 345 msec     TR Vmax:        246.00 cm/s MV E velocity: 78.40 cm/s MV A velocity: 103.00 cm/s  SHUNTS MV E/A ratio:  0.76         Systemic VTI:  0.22 m  Systemic Diam: 1.90 cm Skeet Latch MD Electronically signed by Skeet Latch MD Signature Date/Time: 07/31/2019/4:44:05 PM    Final         Scheduled Meds:  amLODipine  5 mg Oral Daily   aspirin EC  81 mg Oral Daily   atorvastatin  80 mg Oral Daily   docusate sodium  100 mg Oral Daily   donepezil  10 mg Oral QHS   ferrous sulfate  325 mg Oral BID WC   fluticasone  1 spray Each Nare Daily   heparin  5,000 Units Subcutaneous Q8H   melatonin  10 mg Oral QHS   memantine  10 mg Oral BID   pantoprazole  40 mg Oral Daily   polyethylene glycol  17 g Oral Daily   risperiDONE  1 mg Oral QHS   zonisamide  200 mg Oral QHS   Continuous Infusions:   LOS: 0 days     Cordelia Poche, MD [facility] 08/01/2019, 4:12 PM  If 7PM-7AM, please contact night-coverage [email or web address]

## 2019-08-01 NOTE — Progress Notes (Signed)
Echo 08/01/2019 1. Left ventricular ejection fraction, by estimation, is 55 to 60%. The  left ventricle has normal function. The left ventricle has no regional  wall motion abnormalities. Left ventricular diastolic parameters are  consistent with Grade I diastolic  dysfunction (impaired relaxation). Elevated left ventricular end-diastolic  pressure.  2. Right ventricular systolic function is normal. The right ventricular  size is normal. There is normal pulmonary artery systolic pressure.  3. The mitral valve is normal in structure. Trivial mitral valve  regurgitation. No evidence of mitral stenosis.  4. The aortic valve is tricuspid. Aortic valve regurgitation is not  visualized. No aortic stenosis is present.  5. The inferior vena cava is dilated in size with >50% respiratory  variability, suggesting right atrial pressure of 8 mmHg.    Myoview 08/01/2019  There was no ST segment deviation noted during stress.  T wave inversion was noted during stress in the V2, V3 and V4 leads, beginning at 1 minutes of stress, ending at 3 minutes of stress, and returning to baseline after 1-5 mins of recovery.  Defect 1: There is a medium defect of moderate severity present in the basal inferior, mid inferior and apical inferior location.  This is a low risk study.  Nuclear stress EF: 80%.   Medium size, moderate intensity perfusion defect of the septum, worse at rest than stress, suggestive of artifact. No significant reversible ischemia. LVEF 80% with normal wall motion. This is a low risk study.   No further workup from cardiac perspective. Followup as outpatient in 2-4 weeks. Continue aspirin, lipitor and BP med as outpatient.   Hilbert Corrigan PA Pager: [phone]

## 2019-08-01 NOTE — Progress Notes (Signed)
Nuclear stress test reviewed and shows no ischemia with attenuation artifact.  2D echo with normal LVF and G1DD.  Echo read out as possible elevated filling pressures but BNP is normal.  I do not think her sx are cardiac in nature.  Consider pulmonary eval.  Will sign off.  Call with any questoins.

## 2019-08-02 DIAGNOSIS — I10 Essential (primary) hypertension: Secondary | ICD-10-CM | POA: Diagnosis not present

## 2019-08-02 DIAGNOSIS — J9601 Acute respiratory failure with hypoxia: Secondary | ICD-10-CM | POA: Diagnosis not present

## 2019-08-02 DIAGNOSIS — N1832 Chronic kidney disease, stage 3b: Secondary | ICD-10-CM | POA: Diagnosis not present

## 2019-08-02 DIAGNOSIS — F0391 Unspecified dementia with behavioral disturbance: Secondary | ICD-10-CM | POA: Diagnosis not present

## 2019-08-02 DIAGNOSIS — R079 Chest pain, unspecified: Secondary | ICD-10-CM | POA: Diagnosis not present

## 2019-08-02 LAB — TROPONIN I (HIGH SENSITIVITY)
Troponin I (High Sensitivity): 2 ng/L (ref <18)
Troponin I (High Sensitivity): 2 ng/L (ref <18)

## 2019-08-02 NOTE — Progress Notes (Signed)
Assumed care of patient at this time. Agreed with previous nurse assessment of patient and will cont to monitor.

## 2019-08-02 NOTE — TOC Progression Note (Signed)
Transition of Care [facility]) - Progression Note    Patient Details  Name: ROLANDA CAMPA MRN: [id number] Date of Birth: 11-Jun-1945  [facility]) CM/SW Contact  Ross Ludwig, [place] Phone Number: 08/02/2019, 4:45 PM  Clinical Narrative:     CSW attempted to contact [facility] to inform them that patient is ready for discharge, CSW had to leave a message on voice mail.  CSW continuing to try to get a hold of [facility].          Expected Discharge Plan and Services                                                 Social Determinants of Health (SDOH) Interventions    Readmission Risk Interventions No flowsheet data found.

## 2019-08-02 NOTE — Progress Notes (Signed)
  Made Lenon Oms NP aware, of the pt's c/o chest pain states that it is dull and sharp, felt in the middle of her chest, v/s 136/85, 99,80 2l [place], nitroglycerin 0.4mg  SL given. Along with Maalox 30 ml and 2 mg of Morphine, EKG completed and placed in chart. The pt states that she has received relief of her chest after the interventions were taken she currently rates her pain 4 out of 10. I will continue to monitor.

## 2019-08-02 NOTE — Progress Notes (Signed)
PROGRESS NOTE    Jamie George  ZJI:[id number] DOB: 22-Dec-1945 DOA: 07/30/2019 PCP: Kristen Loader, FNP   Brief Narrative: Jamie George is a 74 y.o. female with history of hypertension migraine dementia chronic abdominal dissection. Patient presented from her ALF secondary to shortness of breath. This has been a progressive problem for which she has sought repeated medical evaluation.   Assessment & Plan:   Principal Problem:   Chest pain Active Problems:   Hypertension   CKD (chronic kidney disease)   Anemia   Dementia (HCC)   Dyspnea   Chest pain Chest pain resolved. HsTroponin flat and negative. Recent CTA negative for PE and history of CT scans negative for aortic dissection. Patient went for nuclear stress test on 6/11 which is low risk. No further workup. Possible this chest pain and dyspnea is mediated by anxiety.  Acute respiratory failure with hypoxia Unknown etiology. Patient has received multiple CT scans in the last two months without evidence of disease. No evidence of heart failure. Weaned to room air. PT/OT recommending [place] services at ALF  CKD stage IIIb Baseline creatinine of 1.2. Stable.  History of abdominal aortic dissection Noted. Patient has had many recent CT scans without evidence of dissection.  Essential hypertension Patient is on verapamil as an outpatient. Continue Verapamil  Dementia with behavioral disturbance Patient currently resides at an ALF. Continue Risperdal, Aricept, Namenda, Remeron, Lexapro  History of Iron deficiency anemia Currently not anemic. On iron supplementation as an outpatient.  GERD Continue Protonix  Hyperlipidemia Continue Lipitor  Headache History of migraines Chronic issue per patient. Tylenol prn.   DVT prophylaxis: Heparin subq Code Status:   Code Status: Full Code Family Communication: None at bedside Disposition Plan: Discharge back to ALF when bed available. Medically ready for  discharge.   Consultants:   Cardiology  Procedures:   NUCLEAR STRESS TEST (08/01/2019)  Antimicrobials:  None    Subjective: No issues this morning or overnight.   Objective: Vitals:   08/01/19 1533 08/01/19 2233 08/02/19 0446 08/02/19 0555  BP: (!) 146/67 136/85 140/73 136/70  Pulse: 63 76 73 70  Resp: 16 20 16 15   Temp: 98.9 F (37.2 C)  98 F (36.7 C) 97.6 F (36.4 C)  TempSrc: Oral  Oral Oral  SpO2: 97% 95% 97% 96%  Weight:      Height:        Intake/Output Summary (Last 24 hours) at 08/02/2019 1323 Last data filed at 08/01/2019 1700 Gross per 24 hour  Intake 120 ml  Output --  Net 120 ml   Filed Weights   07/30/19 1814  Weight: 73 kg    Examination:  General exam: Appears calm and comfortable Respiratory system: Clear to auscultation. Respiratory effort normal. Cardiovascular system: S1 & S2 heard, RRR. No murmurs, rubs, gallops or clicks. Gastrointestinal system: Abdomen is nondistended, soft and nontender. No organomegaly or masses felt. Normal bowel sounds heard. Central nervous system: Alert. No focal neurological deficits. Musculoskeletal: No calf tenderness Skin: No cyanosis. No rashes Psychiatry: Judgement and insight appear impaired. Mood & affect appropriate.     Data Reviewed: I have personally reviewed following labs and imaging studies  CBC Lab Results  Component Value Date   WBC 10.1 07/31/2019   RBC 5.25 (H) 07/31/2019   HGB 15.7 (H) 07/31/2019   HCT 48.3 (H) 07/31/2019   MCV 92.0 07/31/2019   MCH 29.9 07/31/2019   PLT 319 07/31/2019   MCHC 32.5 07/31/2019   RDW 15.0 07/31/2019  LYMPHSABS 2.2 07/30/2019   MONOABS 1.1 (H) 07/30/2019   EOSABS 0.5 07/30/2019   BASOSABS 0.2 (H) 67/01/4579     Last metabolic panel Lab Results  Component Value Date   NA 140 08/01/2019   K 3.9 08/01/2019   CL 105 08/01/2019   CO2 25 08/01/2019   BUN 12 08/01/2019   CREATININE 1.31 (H) 08/01/2019   GLUCOSE 99 08/01/2019   GFRNONAA 40  (L) 08/01/2019   GFRAA 46 (L) 08/01/2019   CALCIUM 9.2 08/01/2019   PROT 6.2 (L) 07/26/2019   ALBUMIN 3.3 (L) 07/26/2019   BILITOT 0.7 07/26/2019   ALKPHOS 78 07/26/2019   AST 15 07/26/2019   ALT 11 07/26/2019   ANIONGAP 10 08/01/2019    CBG (last 3)  No results for input(s): GLUCAP in the last 72 hours.   GFR: Estimated Creatinine Clearance: 36.1 mL/min (A) (by C-G formula based on SCr of 1.31 mg/dL (H)).  Coagulation Profile: No results for input(s): INR, PROTIME in the last 168 hours.  Recent Results (from the past 240 hour(s))  SARS Coronavirus 2 by RT PCR (hospital order, performed in [facility] hospital lab) Nasopharyngeal Nasopharyngeal Swab     Status: None   Collection Time: 07/30/19  6:45 PM   Specimen: Nasopharyngeal Swab  Result Value Ref Range Status   SARS Coronavirus 2 NEGATIVE NEGATIVE Final    Comment: (NOTE) SARS-CoV-2 target nucleic acids are NOT DETECTED. The SARS-CoV-2 RNA is generally detectable in upper and lower respiratory specimens during the acute phase of infection. The lowest concentration of SARS-CoV-2 viral copies this assay can detect is 250 copies / mL. A negative result does not preclude SARS-CoV-2 infection and should not be used as the sole basis for treatment or other patient management decisions.  A negative result may occur with improper specimen collection / handling, submission of specimen other than nasopharyngeal swab, presence of viral mutation(s) within the areas targeted by this assay, and inadequate number of viral copies (<250 copies / mL). A negative result must be combined with clinical observations, patient history, and epidemiological information. Fact Sheet for Patients:   StrictlyIdeas.no Fact Sheet for Healthcare Providers: BankingDealers.co.za This test is not yet approved or cleared  by the [place] FDA and has been authorized for detection and/or diagnosis of  SARS-CoV-2 by FDA under an Emergency Use Authorization (EUA).  This EUA will remain in effect (meaning this test can be used) for the duration of the COVID-19 declaration under Section 564(b)(1) of the Act, 21 U.S.C. section 360bbb-3(b)(1), unless the authorization is terminated or revoked sooner. Performed at [facility], [place] [address], [place], [place] [address]         Radiology Studies: NM Myocar Multi W/Spect W/Wall Motion / EF  Result Date: 08/01/2019  There was no ST segment deviation noted during stress.  T wave inversion was noted during stress in the V2, V3 and V4 leads, beginning at 1 minutes of stress, ending at 3 minutes of stress, and returning to baseline after 1-5 mins of recovery.  Defect 1: There is a medium defect of moderate severity present in the basal inferior, mid inferior and apical inferior location.  This is a low risk study.  Nuclear stress EF: 80%.  Medium size, moderate intensity perfusion defect of the septum, worse at rest than stress, suggestive of artifact. No significant reversible ischemia. LVEF 80% with normal wall motion. This is a low risk study.   ECHOCARDIOGRAM COMPLETE  Result Date: 07/31/2019  ECHOCARDIOGRAM REPORT   Patient Name:   Jamie George Date of Exam: 07/31/2019 Medical Rec #:  [id number]           Height:       67.0 in Accession #:    [id number]          Weight:       160.9 lb Date of Birth:  Mar 20, 1945            BSA:          1.844 m Patient Age:    63 years            BP:           123/93 mmHg Patient Gender: F                   HR:           58 bpm. Exam Location:  Inpatient Procedure: 2D Echo, Cardiac Doppler, Color Doppler and Intracardiac            Opacification Agent Indications:    Dyspnea 786.09 / R06.00  History:        Patient has prior history of Echocardiogram examinations, most                 recent 08/13/2017. Signs/Symptoms:Chest Pain and Dyspnea; Risk                 Factors:Hypertension and  Former Smoker.  Sonographer:    Vickie Epley RDCS Referring Phys: [id number] [place]  1. Left ventricular ejection fraction, by estimation, is 55 to 60%. The left ventricle has normal function. The left ventricle has no regional wall motion abnormalities. Left ventricular diastolic parameters are consistent with Grade I diastolic dysfunction (impaired relaxation). Elevated left ventricular end-diastolic pressure.  2. Right ventricular systolic function is normal. The right ventricular size is normal. There is normal pulmonary artery systolic pressure.  3. The mitral valve is normal in structure. Trivial mitral valve regurgitation. No evidence of mitral stenosis.  4. The aortic valve is tricuspid. Aortic valve regurgitation is not visualized. No aortic stenosis is present.  5. The inferior vena cava is dilated in size with >50% respiratory variability, suggesting right atrial pressure of 8 mmHg. FINDINGS  Left Ventricle: Left ventricular ejection fraction, by estimation, is 55 to 60%. The left ventricle has normal function. The left ventricle has no regional wall motion abnormalities. Definity contrast agent was given IV to delineate the left ventricular  endocardial borders. The left ventricular internal cavity size was normal in size. There is no left ventricular hypertrophy. Left ventricular diastolic parameters are consistent with Grade I diastolic dysfunction (impaired relaxation). Elevated left ventricular end-diastolic pressure. Right Ventricle: The right ventricular size is normal. No increase in right ventricular wall thickness. Right ventricular systolic function is normal. There is normal pulmonary artery systolic pressure. The tricuspid regurgitant velocity is 2.46 m/s, and  with an assumed right atrial pressure of 8 mmHg, the estimated right ventricular systolic pressure is 82.9 mmHg. Left Atrium: Left atrial size was normal in size. Right Atrium: Right atrial size was normal in size.  Pericardium: There is no evidence of pericardial effusion. Mitral Valve: The mitral valve is normal in structure. Normal mobility of the mitral valve leaflets. Trivial mitral valve regurgitation. No evidence of mitral valve stenosis. Tricuspid Valve: The tricuspid valve is normal in structure. Tricuspid valve regurgitation is mild . No evidence of tricuspid stenosis. Aortic Valve: The aortic valve is  tricuspid. Aortic valve regurgitation is not visualized. No aortic stenosis is present. Pulmonic Valve: The pulmonic valve was normal in structure. Pulmonic valve regurgitation is not visualized. No evidence of pulmonic stenosis. Aorta: The aortic root is normal in size and structure. Venous: The inferior vena cava is dilated in size with greater than 50% respiratory variability, suggesting right atrial pressure of 8 mmHg. IAS/Shunts: No atrial level shunt detected by color flow Doppler.  LEFT VENTRICLE PLAX 2D LVIDd:         3.90 cm     Diastology LVIDs:         2.80 cm     LV e' lateral:   6.65 cm/s LV PW:         0.70 cm     LV E/e' lateral: 11.8 LV IVS:        0.70 cm     LV e' medial:    5.11 cm/s LVOT diam:     1.90 cm     LV E/e' medial:  15.3 LV SV:         62 LV SV Index:   34 LVOT Area:     2.84 cm  LV Volumes (MOD) LV vol d, MOD A2C: 60.9 ml LV vol d, MOD A4C: 49.2 ml LV vol s, MOD A2C: 22.1 ml LV vol s, MOD A4C: 20.8 ml LV SV MOD A2C:     38.8 ml LV SV MOD A4C:     49.2 ml LV SV MOD BP:      34.2 ml RIGHT VENTRICLE RV S prime:     11.00 cm/s LEFT ATRIUM             Index      RIGHT ATRIUM          Index LA diam:        3.10 cm 1.68 cm/m RA Area:     7.81 cm LA Vol (A2C):   17.4 ml 9.44 ml/m RA Volume:   13.60 ml 7.38 ml/m LA Vol (A4C):   16.1 ml 8.73 ml/m LA Biplane Vol: 17.4 ml 9.44 ml/m  AORTIC VALVE LVOT Vmax:   96.60 cm/s LVOT Vmean:  64.800 cm/s LVOT VTI:    0.218 m  AORTA Ao Root diam: 3.10 cm MITRAL VALVE                TRICUSPID VALVE MV Area (PHT): 2.20 cm     TR Peak grad:   24.2 mmHg MV  Decel Time: 345 msec     TR Vmax:        246.00 cm/s MV E velocity: 78.40 cm/s MV A velocity: 103.00 cm/s  SHUNTS MV E/A ratio:  0.76         Systemic VTI:  0.22 m                             Systemic Diam: 1.90 cm Skeet Latch MD Electronically signed by Skeet Latch MD Signature Date/Time: 07/31/2019/4:44:05 PM    Final         Scheduled Meds: . amLODipine  5 mg Oral Daily  . aspirin EC  81 mg Oral Daily  . atorvastatin  80 mg Oral Daily  . docusate sodium  100 mg Oral Daily  . donepezil  10 mg Oral QHS  . ferrous sulfate  325 mg Oral BID WC  . fluticasone  1 spray Each Nare Daily  . heparin  5,000  Units Subcutaneous Q8H  . melatonin  10 mg Oral QHS  . memantine  10 mg Oral BID  . pantoprazole  40 mg Oral Daily  . polyethylene glycol  17 g Oral Daily  . risperiDONE  1 mg Oral QHS  . zonisamide  200 mg Oral QHS   Continuous Infusions:   LOS: 0 days     Cordelia Poche, MD [facility] 08/02/2019, 1:23 PM  If 7PM-7AM, please contact night-coverage [email or web address]

## 2019-08-03 DIAGNOSIS — I10 Essential (primary) hypertension: Secondary | ICD-10-CM | POA: Diagnosis not present

## 2019-08-03 DIAGNOSIS — J9601 Acute respiratory failure with hypoxia: Secondary | ICD-10-CM | POA: Diagnosis not present

## 2019-08-03 DIAGNOSIS — F0391 Unspecified dementia with behavioral disturbance: Secondary | ICD-10-CM | POA: Diagnosis not present

## 2019-08-03 DIAGNOSIS — R079 Chest pain, unspecified: Secondary | ICD-10-CM | POA: Diagnosis not present

## 2019-08-03 DIAGNOSIS — N1832 Chronic kidney disease, stage 3b: Secondary | ICD-10-CM | POA: Diagnosis not present

## 2019-08-03 NOTE — Progress Notes (Signed)
PROGRESS NOTE    Jamie George  HBZ:[id number] DOB: May 11, 1945 DOA: 07/30/2019 PCP: Kristen Loader, FNP   Brief Narrative: Jamie George is a 74 y.o. female with history of hypertension migraine dementia chronic abdominal dissection. Patient presented from her ALF secondary to shortness of breath. This has been a progressive problem for which she has sought repeated medical evaluation.   Assessment & Plan:   Principal Problem:   Chest pain Active Problems:   Hypertension   CKD (chronic kidney disease)   Anemia   Dementia (HCC)   Dyspnea   Chest pain Chest pain resolved. HsTroponin flat and negative. Recent CTA negative for PE and history of CT scans negative for aortic dissection. Patient went for nuclear stress test on 6/11 which is low risk. No further workup. Possible this chest pain and dyspnea is mediated by anxiety. Resolved.  Acute respiratory failure with hypoxia Unknown etiology. Patient has received multiple CT scans in the last two months without evidence of disease. No evidence of heart failure. Weaned to room air. PT/OT recommending [place] services at ALF.  CKD stage IIIb Baseline creatinine of 1.2. Stable.  History of abdominal aortic dissection Noted. Patient has had many recent CT scans without evidence of dissection.  Essential hypertension Patient is on verapamil as an outpatient. Continue Verapamil  Dementia with behavioral disturbance Patient currently resides at an ALF. Continue Risperdal, Aricept, Namenda, Remeron, Lexapro  History of Iron deficiency anemia Currently not anemic. On iron supplementation as an outpatient.  GERD Continue Protonix  Hyperlipidemia Continue Lipitor  Headache History of migraines Chronic issue per patient. Tylenol prn.   DVT prophylaxis: Heparin subq Code Status:   Code Status: Full Code Family Communication: None at bedside Disposition Plan: Discharge back to ALF when bed available. Medically ready for  discharge.   Consultants:   Cardiology  Procedures:   NUCLEAR STRESS TEST (08/01/2019)  Antimicrobials:  None    Subjective: No issues. Wants to go back to her ALF  Objective: Vitals:   08/02/19 0446 08/02/19 0555 08/02/19 2038 08/03/19 0554  BP: 140/73 136/70 (!) 149/80 133/76  Pulse: 73 70 69 65  Resp: 16 15 18 18   Temp: 98 F (36.7 C) 97.6 F (36.4 C) 98.4 F (36.9 C) 98.1 F (36.7 C)  TempSrc: Oral Oral Oral Oral  SpO2: 97% 96% 96% 92%  Weight:      Height:       No intake or output data in the 24 hours ending 08/03/19 1133 Filed Weights   07/30/19 1814  Weight: 73 kg    Examination:  General exam: Appears calm and comfortable Respiratory system: Clear to auscultation. Respiratory effort normal. Cardiovascular system: S1 & S2 heard, RRR. No murmurs, rubs, gallops or clicks. Gastrointestinal system: Abdomen is nondistended, soft and nontender. No organomegaly or masses felt. Normal bowel sounds heard. Central nervous system: Alert. No focal neurological deficits. Musculoskeletal: No edema. No calf tenderness Skin: No cyanosis. No rashes   Data Reviewed: I have personally reviewed following labs and imaging studies  CBC Lab Results  Component Value Date   WBC 10.1 07/31/2019   RBC 5.25 (H) 07/31/2019   HGB 15.7 (H) 07/31/2019   HCT 48.3 (H) 07/31/2019   MCV 92.0 07/31/2019   MCH 29.9 07/31/2019   PLT 319 07/31/2019   MCHC 32.5 07/31/2019   RDW 15.0 07/31/2019   LYMPHSABS 2.2 07/30/2019   MONOABS 1.1 (H) 07/30/2019   EOSABS 0.5 07/30/2019   BASOSABS 0.2 (H) 07/30/2019  Last metabolic panel Lab Results  Component Value Date   NA 140 08/01/2019   K 3.9 08/01/2019   CL 105 08/01/2019   CO2 25 08/01/2019   BUN 12 08/01/2019   CREATININE 1.31 (H) 08/01/2019   GLUCOSE 99 08/01/2019   GFRNONAA 40 (L) 08/01/2019   GFRAA 46 (L) 08/01/2019   CALCIUM 9.2 08/01/2019   PROT 6.2 (L) 07/26/2019   ALBUMIN 3.3 (L) 07/26/2019   BILITOT 0.7  07/26/2019   ALKPHOS 78 07/26/2019   AST 15 07/26/2019   ALT 11 07/26/2019   ANIONGAP 10 08/01/2019    CBG (last 3)  No results for input(s): GLUCAP in the last 72 hours.   GFR: Estimated Creatinine Clearance: 36.1 mL/min (A) (by C-G formula based on SCr of 1.31 mg/dL (H)).  Coagulation Profile: No results for input(s): INR, PROTIME in the last 168 hours.  Recent Results (from the past 240 hour(s))  SARS Coronavirus 2 by RT PCR (hospital order, performed in [facility] hospital lab) Nasopharyngeal Nasopharyngeal Swab     Status: None   Collection Time: 07/30/19  6:45 PM   Specimen: Nasopharyngeal Swab  Result Value Ref Range Status   SARS Coronavirus 2 NEGATIVE NEGATIVE Final    Comment: (NOTE) SARS-CoV-2 target nucleic acids are NOT DETECTED. The SARS-CoV-2 RNA is generally detectable in upper and lower respiratory specimens during the acute phase of infection. The lowest concentration of SARS-CoV-2 viral copies this assay can detect is 250 copies / mL. A negative result does not preclude SARS-CoV-2 infection and should not be used as the sole basis for treatment or other patient management decisions.  A negative result may occur with improper specimen collection / handling, submission of specimen other than nasopharyngeal swab, presence of viral mutation(s) within the areas targeted by this assay, and inadequate number of viral copies (<250 copies / mL). A negative result must be combined with clinical observations, patient history, and epidemiological information. Fact Sheet for Patients:   StrictlyIdeas.no Fact Sheet for Healthcare Providers: BankingDealers.co.za This test is not yet approved or cleared  by the [place] FDA and has been authorized for detection and/or diagnosis of SARS-CoV-2 by FDA under an Emergency Use Authorization (EUA).  This EUA will remain in effect (meaning this test can be used) for the duration  of the COVID-19 declaration under Section 564(b)(1) of the Act, 21 U.S.C. section 360bbb-3(b)(1), unless the authorization is terminated or revoked sooner. Performed at [facility], [place] [address] [place], [place] [address]         Radiology Studies: NM Myocar Multi W/Spect W/Wall Motion / EF  Result Date: 08/01/2019  There was no ST segment deviation noted during stress.  T wave inversion was noted during stress in the V2, V3 and V4 leads, beginning at 1 minutes of stress, ending at 3 minutes of stress, and returning to baseline after 1-5 mins of recovery.  Defect 1: There is a medium defect of moderate severity present in the basal inferior, mid inferior and apical inferior location.  This is a low risk study.  Nuclear stress EF: 80%.  Medium size, moderate intensity perfusion defect of the septum, worse at rest than stress, suggestive of artifact. No significant reversible ischemia. LVEF 80% with normal wall motion. This is a low risk study.        Scheduled Meds: . amLODipine  5 mg Oral Daily  . aspirin EC  81 mg Oral Daily  . atorvastatin  80 mg Oral Daily  .  docusate sodium  100 mg Oral Daily  . donepezil  10 mg Oral QHS  . ferrous sulfate  325 mg Oral BID WC  . fluticasone  1 spray Each Nare Daily  . heparin  5,000 Units Subcutaneous Q8H  . melatonin  10 mg Oral QHS  . memantine  10 mg Oral BID  . pantoprazole  40 mg Oral Daily  . polyethylene glycol  17 g Oral Daily  . risperiDONE  1 mg Oral QHS  . zonisamide  200 mg Oral QHS   Continuous Infusions:   LOS: 0 days     Cordelia Poche, MD [facility] 08/03/2019, 11:33 AM  If 7PM-7AM, please contact night-coverage [email or web address]

## 2019-08-03 NOTE — TOC Progression Note (Signed)
Transition of Care [facility]) - Progression Note    Patient Details  Name: Jamie George MRN: [id number] Date of Birth: May 14, 1945  Transition of [facility] CM/SW [place], [place] Phone Number: 08/03/2019, 10:10 AM  Clinical Narrative:    LCSW spoke with [facility] front desk regarding discharge coordination. Front desk agreeable to send email to admin as she has access to her email on the weekend. Voice mail left with resident care director, [facility] requesting a call back to coordinate upcoming dc back to ALF.  Readmission Risk Interventions No flowsheet data found.

## 2019-08-04 DIAGNOSIS — J9601 Acute respiratory failure with hypoxia: Secondary | ICD-10-CM | POA: Diagnosis not present

## 2019-08-04 DIAGNOSIS — R279 Unspecified lack of coordination: Secondary | ICD-10-CM | POA: Diagnosis not present

## 2019-08-04 DIAGNOSIS — F0391 Unspecified dementia with behavioral disturbance: Secondary | ICD-10-CM | POA: Diagnosis not present

## 2019-08-04 DIAGNOSIS — I10 Essential (primary) hypertension: Secondary | ICD-10-CM | POA: Diagnosis not present

## 2019-08-04 DIAGNOSIS — R079 Chest pain, unspecified: Secondary | ICD-10-CM | POA: Diagnosis not present

## 2019-08-04 DIAGNOSIS — Z743 Need for continuous supervision: Secondary | ICD-10-CM | POA: Diagnosis not present

## 2019-08-04 DIAGNOSIS — R0789 Other chest pain: Secondary | ICD-10-CM | POA: Diagnosis not present

## 2019-08-04 DIAGNOSIS — N1832 Chronic kidney disease, stage 3b: Secondary | ICD-10-CM | POA: Diagnosis not present

## 2019-08-04 DIAGNOSIS — R4182 Altered mental status, unspecified: Secondary | ICD-10-CM | POA: Diagnosis not present

## 2019-08-04 NOTE — TOC Progression Note (Signed)
Transition of Care [facility]) - Progression Note    Patient Details  Name: Jamie George MRN: [id number] Date of Birth: 02-Jan-1946  Transition [facility]) CM/SW Contact  Purcell Mouton, RN Phone Number: 08/04/2019, 9:21 AM  Clinical Narrative:    A call to [facility] was made which went to VM. A second call was made and left bCM telephone number for return call.        Expected Discharge Plan and Services                                                 Social Determinants of Health (SDOH) Interventions    Readmission Risk Interventions No flowsheet data found.

## 2019-08-04 NOTE — [place] FL2 (Signed)
[place] LEVEL OF CARE SCREENING TOOL     IDENTIFICATION  Patient Name: Jamie George Birthdate: [birth year] Sex: female Admission Date (Current Location): 07/30/2019  [facility] and [place] Number:  Herbalist and Address:  [facility],  [place], [place]      Provider Number: [id number]  Attending Physician Name and Address:  Mariel Aloe, MD  Relative Name and Phone Number:  [name] [phone] daughter    Current Level of Care: Hospital Recommended Level of Care: Other (Comment) (ALF) Prior Approval Number:    Date Approved/Denied:   PASRR Number:    Discharge Plan: Other (Comment)    Current Diagnoses: Patient Active Problem List   Diagnosis Date Noted  . Dyspnea 07/30/2019  . Dementia ([place]) 01/02/2018  . Aggression 01/02/2018  . Dissection of abdominal aorta ([place]) 08/13/2017  . Chest pain 08/13/2017  . Hypertension 08/13/2017  . CKD (chronic kidney disease) 08/13/2017  . Anemia 08/13/2017    Orientation RESPIRATION BLADDER Height & Weight     Self, Time, Situation, Place  Normal Incontinent Weight: 73 kg Height:  5\' 7"  (170.2 cm)  BEHAVIORAL SYMPTOMS/MOOD NEUROLOGICAL BOWEL NUTRITION STATUS      Incontinent Diet (Hearrt Healthy)  AMBULATORY STATUS COMMUNICATION OF NEEDS Skin   Independent Verbally Normal                       Personal Care Assistance Level of Assistance  Bathing, Feeding, Dressing Bathing Assistance: Independent Feeding assistance: Independent       Functional Limitations Info  Sight, Hearing, Speech Sight Info: Adequate Hearing Info: Adequate Speech Info: Adequate    SPECIAL CARE FACTORS FREQUENCY  PT (By licensed PT), OT (By licensed OT)     PT Frequency: Eval and Treat OT Frequency: Eval and Treat            Contractures Contractures Info: Not present    Additional Factors Info  Code Status, Allergies Code Status Info: FULL Allergies Info:  No Known Allergies           Current Medications (08/04/2019):  This is the current hospital active medication list Current Facility-Administered Medications  Medication Dose Route Frequency Provider Last Rate Last Admin  . acetaminophen (TYLENOL) tablet 650 mg  650 mg Oral Q6H PRN Mariel Aloe, MD   650 mg at 08/04/19 1054  . alum & mag hydroxide-simeth (MAALOX/MYLANTA) 200-200-20 MG/5ML suspension 30 mL  30 mL Per Tube Q4H PRN Mariel Aloe, MD   30 mL at 08/01/19 2316  . amLODipine (NORVASC) tablet 5 mg  5 mg Oral Daily Sueanne Margarita, MD   5 mg at 08/04/19 0917  . aspirin EC tablet 81 mg  81 mg Oral Daily Rise Patience, MD   81 mg at 08/04/19 0916  . atorvastatin (LIPITOR) tablet 80 mg  80 mg Oral Daily Sueanne Margarita, MD   80 mg at 08/04/19 0915  . docusate sodium (COLACE) capsule 100 mg  100 mg Oral Daily Rise Patience, MD   100 mg at 08/04/19 0915  . donepezil (ARICEPT) tablet 10 mg  10 mg Oral QHS Rise Patience, MD   10 mg at 08/03/19 2123  . ferrous sulfate tablet 325 mg  325 mg Oral BID WC Rise Patience, MD   325 mg at 08/04/19 0916  . fluticasone (FLONASE) 50 MCG/ACT nasal spray 1 spray  1 spray Each Nare Daily Hal Hope,  Doreatha Lew, MD   1 spray at 08/04/19 0918  . heparin injection 5,000 Units  5,000 Units Subcutaneous Q8H Rise Patience, MD   5,000 Units at 08/04/19 0527  . HYDROcodone-acetaminophen (NORCO/VICODIN) 5-325 MG per tablet 1 tablet  1 tablet Oral Q6H PRN Rise Patience, MD   1 tablet at 08/04/19 0917  . melatonin tablet 10 mg  10 mg Oral QHS Rise Patience, MD   10 mg at 08/03/19 2123  . memantine (NAMENDA) tablet 10 mg  10 mg Oral BID Rise Patience, MD   10 mg at 08/04/19 0916  . ondansetron (ZOFRAN) tablet 4 mg  4 mg Oral Q6H PRN Rise Patience, MD       Or  . ondansetron [facility]) injection 4 mg  4 mg Intravenous Q6H PRN Rise Patience, MD      . pantoprazole (PROTONIX) EC tablet 40 mg  40 mg  Oral Daily Rise Patience, MD   40 mg at 08/04/19 0916  . polyethylene glycol (MIRALAX / GLYCOLAX) packet 17 g  17 g Oral Daily Rise Patience, MD   17 g at 08/04/19 0917  . risperiDONE (RISPERDAL) tablet 1 mg  1 mg Oral QHS Rise Patience, MD   1 mg at 08/03/19 2123  . zonisamide (ZONEGRAN) capsule 200 mg  200 mg Oral QHS Rise Patience, MD   200 mg at 08/03/19 2124     Discharge Medications: Please see discharge summary for a list of discharge medications.  Relevant Imaging Results:  Relevant Lab Results:   Additional Information [phone]  Purcell Mouton, RN

## 2019-08-04 NOTE — TOC Progression Note (Addendum)
Transition of Care [facility]) - Progression Note    Patient Details  Name: Jamie George MRN: [id number] Date of Birth: February 07, 1946  [facility] CM/SW Contact  Purcell Mouton, RN Phone Number: 08/04/2019, 2:11 PM  Clinical Narrative:     ALF was faxed FL2 and Discharge Summary with no response from ALF that pt may come back. Spoke with pt who states she is so  Ready to go. Explained that ALF has not called with Approval for her to come back.         Expected Discharge Plan and Services           Expected Discharge Date: 08/04/19                                     Social Determinants of Health (SDOH) Interventions    Readmission Risk Interventions No flowsheet data found.

## 2019-08-04 NOTE — Progress Notes (Signed)
Called report to RN at [facility], pt is well known to  RN. Pt awaiting for [facility] to transport pt back to facilty.

## 2019-08-04 NOTE — Discharge Instructions (Signed)
Jamie George,  You were in the hospital for chest pain and dyspnea. This was worked up and is low risk for any heart disease

## 2019-08-04 NOTE — Progress Notes (Signed)
PROGRESS NOTE    Jamie George  WUX:[id number] DOB: July 29, 1945 DOA: 07/30/2019 PCP: Kristen Loader, FNP   Brief Narrative: Jamie George is a 74 y.o. female with history of hypertension migraine dementia chronic abdominal dissection. Patient presented from her ALF secondary to shortness of breath. This has been a progressive problem for which she has sought repeated medical evaluation.   Assessment & Plan:   Principal Problem:   Chest pain Active Problems:   Hypertension   CKD (chronic kidney disease)   Anemia   Dementia (HCC)   Dyspnea   Chest pain Chest pain resolved. HsTroponin flat and negative. Recent CTA negative for PE and history of CT scans negative for aortic dissection. Patient went for nuclear stress test on 6/11 which is low risk. No further workup. Possible this chest pain and dyspnea is mediated by anxiety. Resolved.  Acute respiratory failure with hypoxia Unknown etiology. Patient has received multiple CT scans in the last two months without evidence of disease. No evidence of heart failure. Weaned to room air. PT/OT recommending [place] services at ALF.  CKD stage IIIb Baseline creatinine of 1.2. Stable.  History of abdominal aortic dissection Noted. Patient has had many recent CT scans without evidence of dissection.  Essential hypertension Patient is on verapamil as an outpatient. Continue Verapamil  Dementia with behavioral disturbance Patient currently resides at an ALF. Continue Risperdal, Aricept, Namenda, Remeron, Lexapro  History of Iron deficiency anemia Currently not anemic. On iron supplementation as an outpatient.  GERD Continue Protonix  Hyperlipidemia Continue Lipitor  Headache History of migraines Chronic issue per patient. Tylenol prn.   DVT prophylaxis: Heparin subq Code Status:   Code Status: Full Code Family Communication: None at bedside Disposition Plan: Discharge back to ALF when bed available. Medically ready for  discharge.   Consultants:   Cardiology  Procedures:   NUCLEAR STRESS TEST (08/01/2019)  Antimicrobials:  None    Subjective: No concerns. Wanting to go back home.  Objective: Vitals:   08/03/19 0554 08/03/19 1225 08/03/19 2020 08/04/19 0448  BP: 133/76 125/88 134/70 (!) 150/93  Pulse: 65 83 81 74  Resp: 18 17 17 16   Temp: 98.1 F (36.7 C) 97.9 F (36.6 C) 98.5 F (36.9 C) 98.7 F (37.1 C)  TempSrc: Oral Oral Oral   SpO2: 92% 94% 91% 94%  Weight:      Height:        Intake/Output Summary (Last 24 hours) at 08/04/2019 2536 Last data filed at 08/03/2019 1904 Gross per 24 hour  Intake 720 ml  Output --  Net 720 ml   Filed Weights   07/30/19 1814  Weight: 73 kg    Examination:  General: Well appearing, no distress Respiratory: Clear to auscultation bilaterally. Unlabored work of breathing. No wheezing or rales. Cardiovascular: Regular rate and rhythm. Normal S1 and S2. No heart murmurs present. No extra heart sounds Psychiatry: normal mood/affect. Alert.   Data Reviewed: I have personally reviewed following labs and imaging studies  CBC Lab Results  Component Value Date   WBC 10.1 07/31/2019   RBC 5.25 (H) 07/31/2019   HGB 15.7 (H) 07/31/2019   HCT 48.3 (H) 07/31/2019   MCV 92.0 07/31/2019   MCH 29.9 07/31/2019   PLT 319 07/31/2019   MCHC 32.5 07/31/2019   RDW 15.0 07/31/2019   LYMPHSABS 2.2 07/30/2019   MONOABS 1.1 (H) 07/30/2019   EOSABS 0.5 07/30/2019   BASOSABS 0.2 (H) 64/40/3474     Last metabolic panel Lab  Results  Component Value Date   NA 140 08/01/2019   K 3.9 08/01/2019   CL 105 08/01/2019   CO2 25 08/01/2019   BUN 12 08/01/2019   CREATININE 1.31 (H) 08/01/2019   GLUCOSE 99 08/01/2019   GFRNONAA 40 (L) 08/01/2019   GFRAA 46 (L) 08/01/2019   CALCIUM 9.2 08/01/2019   PROT 6.2 (L) 07/26/2019   ALBUMIN 3.3 (L) 07/26/2019   BILITOT 0.7 07/26/2019   ALKPHOS 78 07/26/2019   AST 15 07/26/2019   ALT 11 07/26/2019   ANIONGAP 10  08/01/2019    CBG (last 3)  No results for input(s): GLUCAP in the last 72 hours.   GFR: Estimated Creatinine Clearance: 36.1 mL/min (A) (by C-G formula based on SCr of 1.31 mg/dL (H)).  Coagulation Profile: No results for input(s): INR, PROTIME in the last 168 hours.  Recent Results (from the past 240 hour(s))  SARS Coronavirus 2 by RT PCR (hospital order, performed in [facility] hospital lab) Nasopharyngeal Nasopharyngeal Swab     Status: None   Collection Time: 07/30/19  6:45 PM   Specimen: Nasopharyngeal Swab  Result Value Ref Range Status   SARS Coronavirus 2 NEGATIVE NEGATIVE Final    Comment: (NOTE) SARS-CoV-2 target nucleic acids are NOT DETECTED. The SARS-CoV-2 RNA is generally detectable in upper and lower respiratory specimens during the acute phase of infection. The lowest concentration of SARS-CoV-2 viral copies this assay can detect is 250 copies / mL. A negative result does not preclude SARS-CoV-2 infection and should not be used as the sole basis for treatment or other patient management decisions.  A negative result may occur with improper specimen collection / handling, submission of specimen other than nasopharyngeal swab, presence of viral mutation(s) within the areas targeted by this assay, and inadequate number of viral copies (<250 copies / mL). A negative result must be combined with clinical observations, patient history, and epidemiological information. Fact Sheet for Patients:   StrictlyIdeas.no Fact Sheet for Healthcare Providers: BankingDealers.co.za This test is not yet approved or cleared  by the [place] FDA and has been authorized for detection and/or diagnosis of SARS-CoV-2 by FDA under an Emergency Use Authorization (EUA).  This EUA will remain in effect (meaning this test can be used) for the duration of the COVID-19 declaration under Section 564(b)(1) of the Act, 21 U.S.C. section  360bbb-3(b)(1), unless the authorization is terminated or revoked sooner. Performed at [facility], [place] [address], [place], [place] [address]         Radiology Studies: No results found.      Scheduled Meds: . amLODipine  5 mg Oral Daily  . aspirin EC  81 mg Oral Daily  . atorvastatin  80 mg Oral Daily  . docusate sodium  100 mg Oral Daily  . donepezil  10 mg Oral QHS  . ferrous sulfate  325 mg Oral BID WC  . fluticasone  1 spray Each Nare Daily  . heparin  5,000 Units Subcutaneous Q8H  . melatonin  10 mg Oral QHS  . memantine  10 mg Oral BID  . pantoprazole  40 mg Oral Daily  . polyethylene glycol  17 g Oral Daily  . risperiDONE  1 mg Oral QHS  . zonisamide  200 mg Oral QHS   Continuous Infusions:   LOS: 0 days     Cordelia Poche, MD [facility] 08/04/2019, 8:22 AM  If 7PM-7AM, please contact night-coverage [email or web address]

## 2019-08-04 NOTE — Discharge Summary (Signed)
Physician Discharge Summary  Jamie George:[id number] DOB: 1945-06-22 DOA: 07/30/2019  PCP: Jamie Loader, FNP  Admit date: 07/30/2019 Discharge date: 08/04/2019  Admitted From: ALF Disposition: ALF  Recommendations for Outpatient Follow-up:  1. Follow up with PCP in 1 week 2. Please follow up on the following pending results: None  Discharge Condition: Stable CODE STATUS: Full code Diet recommendation: Heart healthy   Brief/Interim Summary:  Admission HPI written by Jamie Patience, MD   Chief Complaint: Shortness of breath.  HPI: Jamie George is a 74 y.o. female with history of hypertension migraine dementia chronic abdominal dissection presents to the ER with complaints of having ongoing shortness of breath for the last 2 to 3 weeks.  Patient states she gets short of breath on any activity and even at rest.  For the same duration of time patient has been having retrosternal chest pain which patient is not able to characterize exactly.  Denies any radiation had some nausea and abdominal discomfort.  Patient had come to the ER on July 25, 2029 at the time CT angiogram was negative for pulmonary embolism and discharged back to the facility.  Since patient is still complaining of shortness of breath patient was brought to the ER.    Hospital course:  Chest pain Chest pain resolved. HsTroponin flat and negative. Recent CTA negative for PE and history of CT scans negative for aortic dissection. Patient went for nuclear stress test on 6/11 which is low risk. No further workup. Possible this chest pain and dyspnea is mediated by anxiety. Resolved.  Acute respiratory failure with hypoxia Unknown etiology. Patient has received multiple CT scans in the last two months without evidence of disease. No evidence of heart failure. Weaned to room air. PT/OT recommending [place] services at ALF.  CKD stage IIIb Baseline creatinine of 1.2. Stable.  History of abdominal  aortic dissection Noted. Patient has had many recent CT scans without evidence of dissection.  Essential hypertension Patient is on verapamil as an outpatient. Continue Verapamil  Dementia with behavioral disturbance Patient currently resides at an ALF. Continue Risperdal, Aricept, Namenda, Remeron, Lexapro  History of Iron deficiency anemia Currently not anemic. On iron supplementation as an outpatient.  GERD Continue Protonix  Hyperlipidemia Continue Lipitor  Headache History of migraines Chronic issue per patient. Tylenol prn.  Discharge Diagnoses:  Principal Problem:   Chest pain Active Problems:   Hypertension   CKD (chronic kidney disease)   Anemia   Dementia ([place])   Dyspnea    Discharge Instructions  Discharge Instructions    Call MD for:  severe uncontrolled pain   Complete by: As directed      Allergies as of 08/04/2019   No Known Allergies     Medication List    STOP taking these medications   HYDROcodone-acetaminophen 5-325 MG tablet Commonly known as: NORCO/VICODIN   ondansetron 4 MG tablet Commonly known as: ZOFRAN     TAKE these medications   acetaminophen 325 MG tablet Commonly known as: TYLENOL Take 650 mg by mouth in the morning and at bedtime. What changed: Another medication with the same name was removed. Continue taking this medication, and follow the directions you see here.   aspirin 81 MG EC tablet Take 1 tablet (81 mg total) by mouth daily. With food   atorvastatin 40 MG tablet Commonly known as: LIPITOR Take 40 mg by mouth daily.   docusate sodium 100 MG capsule Commonly known as: COLACE Take 100 mg  by mouth daily.   donepezil 10 MG tablet Commonly known as: ARICEPT Take 10 mg by mouth at bedtime.   escitalopram 20 MG tablet Commonly known as: LEXAPRO Take 20 mg by mouth daily.   ferrous sulfate 325 (65 FE) MG tablet Take 325 mg by mouth 2 (two) times daily with a meal.   fluticasone 50 MCG/ACT nasal  spray Commonly known as: FLONASE Place 1 spray into both nostrils daily.   melatonin 5 MG Tabs Take 10 mg by mouth at bedtime.   memantine 10 MG tablet Commonly known as: NAMENDA Take 1 tablet (10 mg total) by mouth 2 (two) times daily.   mirtazapine 7.5 MG tablet Commonly known as: REMERON Take 7.5 mg by mouth at bedtime.   pantoprazole 40 MG tablet Commonly known as: PROTONIX Take 40 mg by mouth daily.   polyethylene glycol 17 g packet Commonly known as: MIRALAX / GLYCOLAX Take 17 g by mouth daily.   risperiDONE 0.5 MG tablet Commonly known as: RISPERDAL Take 1 mg by mouth at bedtime.   traZODone 50 MG tablet Commonly known as: DESYREL Take 50 mg by mouth at bedtime.   verapamil 120 MG CR tablet Commonly known as: CALAN-SR Take 120 mg by mouth daily.   zonisamide 100 MG capsule Commonly known as: ZONEGRAN Take 200 mg by mouth at bedtime.       No Known Allergies  Consultations:  Cardiology   Procedures/Studies: DG Chest 2 View  Result Date: 07/26/2019 CLINICAL DATA:  Cough. Arrived by [facility] for increased shortness of breath and cough. Reported fever. EXAM: CHEST - 2 VIEW COMPARISON:  07/10/2019 FINDINGS: Heart size is normal. The lungs are free of focal consolidations. There is minimal subsegmental atelectasis at the lung bases. Trace pleural effusion seen at the posterior costophrenic angle, likely on the RIGHT side. IMPRESSION: Bibasilar atelectasis and trace RIGHT pleural effusion. Electronically Signed   By: Jamie George M.D.   On: 07/26/2019 10:46   CT Head Wo Contrast  Result Date: 07/30/2019 CLINICAL DATA:  Headaches. EXAM: CT HEAD WITHOUT CONTRAST TECHNIQUE: Contiguous axial images were obtained from the base of the skull through the vertex without intravenous contrast. COMPARISON:  Jul 04, 2019 FINDINGS: Brain: There is mild cerebral atrophy with widening of the extra-axial spaces and ventricular dilatation. There are areas of decreased  attenuation within the white matter tracts of the supratentorial brain, consistent with microvascular disease changes. A small chronic right basal ganglia lacunar infarct is seen. Vascular: No hyperdense vessel or unexpected calcification. Skull: A left frontal craniotomy defect is seen. Sinuses/Orbits: There is moderate severity bilateral maxillary sinus mucosal thickening. Mild to moderate severity sphenoid sinus mucosal thickening is also seen. Other: None. IMPRESSION: 1. Generalized cerebral atrophy. 2. No acute intracranial abnormality. 3. Moderate severity bilateral maxillary and sphenoid sinus disease. 4. Left frontal craniotomy. Electronically Signed   By: Virgina Norfolk M.D.   On: 07/30/2019 20:57   CT Angio Chest PE W/Cm &/Or Wo Cm  Result Date: 07/26/2019 CLINICAL DATA:  Cough, shortness of breath EXAM: CT ANGIOGRAPHY CHEST WITH CONTRAST TECHNIQUE: Multidetector CT imaging of the chest was performed using the standard protocol during bolus administration of intravenous contrast. Multiplanar CT image reconstructions and MIPs were obtained to evaluate the vascular anatomy. CONTRAST:  63mL OMNIPAQUE IOHEXOL 350 MG/ML SOLN COMPARISON:  CT chest angiogram, 07/10/2019, 08/13/2017 FINDINGS: Cardiovascular: Satisfactory opacification of the pulmonary arteries to the segmental level. No evidence of pulmonary embolism. Normal heart size. No pericardial effusion. Aortic atherosclerosis.  Mediastinum/Nodes: No enlarged mediastinal, hilar, or axillary lymph nodes. Thyroid gland, trachea, and esophagus demonstrate no significant findings. Lungs/Pleura: 4 mm pulmonary nodule of the peripheral right upper lobe (series 6, image 63). Bandlike scarring and or atelectasis of the bilateral lung bases. Clustered nodularity of the lingula, unchanged, measuring up to 8 mm (series 6, image 79). Nodularity is stable or reduced compared to prior examinations and consistent with definitively benign sequelae of infection or  inflammation. No pleural effusion or pneumothorax. Upper Abdomen: No acute abnormality. Musculoskeletal: No chest wall abnormality. No acute or significant osseous findings. Review of the MIP images confirms the above findings. IMPRESSION: 1. Negative examination for pulmonary embolism. 2. Aortic Atherosclerosis (ICD10-I70.0). Electronically Signed   By: Eddie Candle M.D.   On: 07/26/2019 14:58   NM Myocar Multi W/Spect W/Wall Motion / EF  Result Date: 08/01/2019  There was no ST segment deviation noted during stress.  T wave inversion was noted during stress in the V2, V3 and V4 leads, beginning at 1 minutes of stress, ending at 3 minutes of stress, and returning to baseline after 1-5 mins of recovery.  Defect 1: There is a medium defect of moderate severity present in the basal inferior, mid inferior and apical inferior location.  This is a low risk study.  Nuclear stress EF: 80%.  Medium size, moderate intensity perfusion defect of the septum, worse at rest than stress, suggestive of artifact. No significant reversible ischemia. LVEF 80% with normal wall motion. This is a low risk study.   DG Chest Portable 1 View  Result Date: 07/30/2019 CLINICAL DATA:  Short of breath for 2 weeks, cough, previous tobacco abuse EXAM: PORTABLE CHEST 1 VIEW COMPARISON:  07/26/2019 FINDINGS: The heart size and mediastinal contours are within normal limits. Both lungs are clear. The visualized skeletal structures are unremarkable. IMPRESSION: No active disease. Electronically Signed   By: Randa Ngo M.D.   On: 07/30/2019 19:23   DG Chest Port 1 View  Result Date: 07/10/2019 CLINICAL DATA:  Chest pain. EXAM: PORTABLE CHEST 1 VIEW COMPARISON:  07/04/2019 FINDINGS: Lungs are adequately inflated without focal airspace consolidation or effusion. Cardiomediastinal silhouette and remainder of the exam is unchanged. IMPRESSION: No active disease. Electronically Signed   By: Marin Olp M.D.   On: 07/10/2019 14:51    ECHOCARDIOGRAM COMPLETE  Result Date: 07/31/2019    ECHOCARDIOGRAM REPORT   Patient Name:   Jamie George Date of Exam: 07/31/2019 Medical Rec #:  [id number]           Height:       67.0 in Accession #:    [id number]          Weight:       160.9 lb Date of Birth:  [birth year]            BSA:          1.844 m Patient Age:    74 years            BP:           123/93 mmHg Patient Gender: F                   HR:           58 bpm. Exam Location:  Inpatient Procedure: 2D Echo, Cardiac Doppler, Color Doppler and Intracardiac            Opacification Agent Indications:    Dyspnea 786.09 / R06.00  History:  Patient has prior history of Echocardiogram examinations, most                 recent 08/13/2017. Signs/Symptoms:Chest Pain and Dyspnea; Risk                 Factors:Hypertension and Former Smoker.  Sonographer:    Vickie Epley RDCS Referring Phys: [id number] [place]  1. Left ventricular ejection fraction, by estimation, is 55 to 60%. The left ventricle has normal function. The left ventricle has no regional wall motion abnormalities. Left ventricular diastolic parameters are consistent with Grade I diastolic dysfunction (impaired relaxation). Elevated left ventricular end-diastolic pressure.  2. Right ventricular systolic function is normal. The right ventricular size is normal. There is normal pulmonary artery systolic pressure.  3. The mitral valve is normal in structure. Trivial mitral valve regurgitation. No evidence of mitral stenosis.  4. The aortic valve is tricuspid. Aortic valve regurgitation is not visualized. No aortic stenosis is present.  5. The inferior vena cava is dilated in size with >50% respiratory variability, suggesting right atrial pressure of 8 mmHg. FINDINGS  Left Ventricle: Left ventricular ejection fraction, by estimation, is 55 to 60%. The left ventricle has normal function. The left ventricle has no regional wall motion abnormalities. Definity contrast agent was  given IV to delineate the left ventricular  endocardial borders. The left ventricular internal cavity size was normal in size. There is no left ventricular hypertrophy. Left ventricular diastolic parameters are consistent with Grade I diastolic dysfunction (impaired relaxation). Elevated left ventricular end-diastolic pressure. Right Ventricle: The right ventricular size is normal. No increase in right ventricular wall thickness. Right ventricular systolic function is normal. There is normal pulmonary artery systolic pressure. The tricuspid regurgitant velocity is 2.46 m/s, and  with an assumed right atrial pressure of 8 mmHg, the estimated right ventricular systolic pressure is 10.1 mmHg. Left Atrium: Left atrial size was normal in size. Right Atrium: Right atrial size was normal in size. Pericardium: There is no evidence of pericardial effusion. Mitral Valve: The mitral valve is normal in structure. Normal mobility of the mitral valve leaflets. Trivial mitral valve regurgitation. No evidence of mitral valve stenosis. Tricuspid Valve: The tricuspid valve is normal in structure. Tricuspid valve regurgitation is mild . No evidence of tricuspid stenosis. Aortic Valve: The aortic valve is tricuspid. Aortic valve regurgitation is not visualized. No aortic stenosis is present. Pulmonic Valve: The pulmonic valve was normal in structure. Pulmonic valve regurgitation is not visualized. No evidence of pulmonic stenosis. Aorta: The aortic root is normal in size and structure. Venous: The inferior vena cava is dilated in size with greater than 50% respiratory variability, suggesting right atrial pressure of 8 mmHg. IAS/Shunts: No atrial level shunt detected by color flow Doppler.  LEFT VENTRICLE PLAX 2D LVIDd:         3.90 cm     Diastology LVIDs:         2.80 cm     LV e' lateral:   6.65 cm/s LV PW:         0.70 cm     LV E/e' lateral: 11.8 LV IVS:        0.70 cm     LV e' medial:    5.11 cm/s LVOT diam:     1.90 cm     LV  E/e' medial:  15.3 LV SV:         62 LV SV Index:   34 LVOT Area:  2.84 cm  LV Volumes (MOD) LV vol d, MOD A2C: 60.9 ml LV vol d, MOD A4C: 49.2 ml LV vol s, MOD A2C: 22.1 ml LV vol s, MOD A4C: 20.8 ml LV SV MOD A2C:     38.8 ml LV SV MOD A4C:     49.2 ml LV SV MOD BP:      34.2 ml RIGHT VENTRICLE RV S prime:     11.00 cm/s LEFT ATRIUM             Index      RIGHT ATRIUM          Index LA diam:        3.10 cm 1.68 cm/m RA Area:     7.81 cm LA Vol (A2C):   17.4 ml 9.44 ml/m RA Volume:   13.60 ml 7.38 ml/m LA Vol (A4C):   16.1 ml 8.73 ml/m LA Biplane Vol: 17.4 ml 9.44 ml/m  AORTIC VALVE LVOT Vmax:   96.60 cm/s LVOT Vmean:  64.800 cm/s LVOT VTI:    0.218 m  AORTA Ao Root diam: 3.10 cm MITRAL VALVE                TRICUSPID VALVE MV Area (PHT): 2.20 cm     TR Peak grad:   24.2 mmHg MV Decel Time: 345 msec     TR Vmax:        246.00 cm/s MV E velocity: 78.40 cm/s MV A velocity: 103.00 cm/s  SHUNTS MV E/A ratio:  0.76         Systemic VTI:  0.22 m                             Systemic Diam: 1.90 cm Skeet Latch MD Electronically signed by Skeet Latch MD Signature Date/Time: 07/31/2019/4:44:05 PM    Final    CT Angio Chest/Abd/Pel for Dissection W and/or W/WO  Result Date: 07/10/2019 CLINICAL DATA:  Chest pain. EXAM: CT ANGIOGRAPHY CHEST, ABDOMEN AND PELVIS TECHNIQUE: Non-contrast CT of the chest was initially obtained. Multidetector CT imaging through the chest, abdomen and pelvis was performed using the standard protocol during bolus administration of intravenous contrast. Multiplanar reconstructed images and MIPs were obtained and reviewed to evaluate the vascular anatomy. CONTRAST:  137mL OMNIPAQUE IOHEXOL 350 MG/ML SOLN COMPARISON:  Jul 04, 2019 FINDINGS: CTA CHEST FINDINGS Cardiovascular: Marked severity atherosclerosis is again seen within the descending thoracic aorta. Satisfactory opacification of the pulmonary arteries to the segmental level. No evidence of pulmonary embolism. Normal heart  size. No pericardial effusion. Mediastinum/Nodes: No enlarged mediastinal, hilar, or axillary lymph nodes. The thyroid gland is mildly enlarged and heterogeneous in appearance. Lungs/Pleura: A stable 4 mm noncalcified lung nodule is seen within the lateral aspect of the right upper lobe. A stable 9 mm noncalcified lung nodule is seen within the inferolateral aspect of the left upper lobe. A mild amount of stable adjacent left upper lobe linear scarring and/or atelectasis is seen. Mild, stable posterior right upper lobe and posterior right lower lobe linear scarring and/or atelectasis is seen. There is no evidence of a pleural effusion or pneumothorax Musculoskeletal: Degenerative changes seen throughout the thoracic spine. Review of the MIP images confirms the above findings. CTA ABDOMEN AND PELVIS FINDINGS VASCULAR Aorta: Moderate severity calcification and atherosclerosis. Normal caliber aorta without aneurysm, dissection, vasculitis or significant stenosis. Celiac: Patent without evidence of aneurysm, dissection, vasculitis or significant stenosis. SMA: Patent without evidence of aneurysm,  dissection, vasculitis or significant stenosis. Renals: Both renal arteries are diminutive in size and patent without evidence of aneurysm, dissection, vasculitis, fibromuscular dysplasia or significant stenosis. IMA: Patent without evidence of aneurysm, dissection, vasculitis or significant stenosis. Inflow: Patent without evidence of aneurysm, dissection, vasculitis or significant stenosis. Veins: No obvious venous abnormality within the limitations of this arterial phase study. Review of the MIP images confirms the above findings. NON-VASCULAR Hepatobiliary: No focal liver abnormality is seen. No gallstones, gallbladder wall thickening, or biliary dilatation. Pancreas: Unremarkable. No pancreatic ductal dilatation or surrounding inflammatory changes. Spleen: Normal in size without focal abnormality. Adrenals/Urinary Tract:  Adrenal glands are unremarkable. Kidneys are normal, without renal calculi, focal lesion, or hydronephrosis. Bladder is unremarkable. Stomach/Bowel: Stomach is within normal limits. The appendix is not identified. No evidence of bowel wall thickening, distention, or inflammatory changes. Noninflamed diverticula are seen throughout the sigmoid colon. Lymphatic: No abnormal abdominal or pelvic lymph nodes are seen. Reproductive: Status post hysterectomy. No adnexal masses. Other: No abdominal wall hernia or abnormality. No abdominopelvic ascites. Musculoskeletal: Moderate to marked severity degenerative changes seen throughout the lumbar spine. Review of the MIP images confirms the above findings. IMPRESSION: 1. Marked severity atherosclerosis of the descending thoracic aorta. No evidence of aortic aneurysm or dissection. 2. No evidence of pulmonary embolism. 3. Stable bilateral upper lobe pulmonary nodules. 4. Noninflamed sigmoid diverticulosis. Aortic Atherosclerosis (ICD10-I70.0). Electronically Signed   By: Virgina Norfolk M.D.   On: 07/10/2019 19:21      TRANSTHORACIC ECHOCARDIOGRAM (07/31/2019) IMPRESSIONS    1. Left ventricular ejection fraction, by estimation, is 55 to 60%. The  left ventricle has normal function. The left ventricle has no regional  wall motion abnormalities. Left ventricular diastolic parameters are  consistent with Grade I diastolic  dysfunction (impaired relaxation). Elevated left ventricular end-diastolic  pressure.  2. Right ventricular systolic function is normal. The right ventricular  size is normal. There is normal pulmonary artery systolic pressure.  3. The mitral valve is normal in structure. Trivial mitral valve  regurgitation. No evidence of mitral stenosis.  4. The aortic valve is tricuspid. Aortic valve regurgitation is not  visualized. No aortic stenosis is present.  5. The inferior vena cava is dilated in size with >50% respiratory  variability,  suggesting right atrial pressure of 8 mmHg.    NUCLEAR STRESS TEST (08/01/2019)   Subjective: No concerns  Discharge Exam: Vitals:   08/03/19 2020 08/04/19 0448  BP: 134/70 (!) 150/93  Pulse: 81 74  Resp: 17 16  Temp: 98.5 F (36.9 C) 98.7 F (37.1 C)  SpO2: 91% 94%   Vitals:   08/03/19 0554 08/03/19 1225 08/03/19 2020 08/04/19 0448  BP: 133/76 125/88 134/70 (!) 150/93  Pulse: 65 83 81 74  Resp: 18 17 17 16   Temp: 98.1 F (36.7 C) 97.9 F (36.6 C) 98.5 F (36.9 C) 98.7 F (37.1 C)  TempSrc: Oral Oral Oral   SpO2: 92% 94% 91% 94%  Weight:      Height:        General: Well appearing, no distress Respiratory: Clear to auscultation bilaterally. Unlabored work of breathing. No wheezing or rales. Cardiovascular: Regular rate and rhythm. Normal S1 and S2. No heart murmurs present. No extra heart sounds Psychiatry: normal mood/affect. Alert.   The results of significant diagnostics from this hospitalization (including imaging, microbiology, ancillary and laboratory) are listed below for reference.     Microbiology: Recent Results (from the past 240 hour(s))  SARS Coronavirus 2 by RT PCR (  hospital order, performed in [facility] hospital lab) Nasopharyngeal Nasopharyngeal Swab     Status: None   Collection Time: 07/30/19  6:45 PM   Specimen: Nasopharyngeal Swab  Result Value Ref Range Status   SARS Coronavirus 2 NEGATIVE NEGATIVE Final    Comment: (NOTE) SARS-CoV-2 target nucleic acids are NOT DETECTED. The SARS-CoV-2 RNA is generally detectable in upper and lower respiratory specimens during the acute phase of infection. The lowest concentration of SARS-CoV-2 viral copies this assay can detect is 250 copies / mL. A negative result does not preclude SARS-CoV-2 infection and should not be used as the sole basis for treatment or other patient management decisions.  A negative result may occur with improper specimen collection / handling, submission of specimen  other than nasopharyngeal swab, presence of viral mutation(s) within the areas targeted by this assay, and inadequate number of viral copies (<250 copies / mL). A negative result must be combined with clinical observations, patient history, and epidemiological information. Fact Sheet for Patients:   StrictlyIdeas.no Fact Sheet for Healthcare Providers: BankingDealers.co.za This test is not yet approved or cleared  by the [place] FDA and has been authorized for detection and/or diagnosis of SARS-CoV-2 by FDA under an Emergency Use Authorization (EUA).  This EUA will remain in effect (meaning this test can be used) for the duration of the COVID-19 declaration under Section 564(b)(1) of the Act, 21 U.S.C. section 360bbb-3(b)(1), unless the authorization is terminated or revoked sooner. Performed at [facility], [place] [address] [place], [place] [address]      Labs: BNP (last 3 results) Recent Labs    07/26/19 1225 07/30/19 1845  BNP 30.7 23.5   Basic Metabolic Panel: Recent Labs  Lab 07/30/19 1845 07/31/19 0300 08/01/19 0407  NA 145 141 140  K 3.6 3.1* 3.9  CL 113* 109 105  CO2 24 21* 25  GLUCOSE 93 79 99  BUN 13 12 12   CREATININE 1.26* 1.17*  1.18* 1.31*  CALCIUM 9.0 8.5* 9.2  MG  --  2.1  --    Liver Function Tests: No results for input(s): AST, ALT, ALKPHOS, BILITOT, PROT, ALBUMIN in the last 168 hours. No results for input(s): LIPASE, AMYLASE in the last 168 hours. No results for input(s): AMMONIA in the last 168 hours. CBC: Recent Labs  Lab 07/30/19 1845 07/31/19 0300  WBC 10.6* 10.1  NEUTROABS 6.5  --   HGB 15.6* 15.7*  HCT 48.7* 48.3*  MCV 92.4 92.0  PLT 346 319   Cardiac Enzymes: No results for input(s): CKTOTAL, CKMB, CKMBINDEX, TROPONINI in the last 168 hours. BNP: Invalid input(s): POCBNP CBG: No results for input(s): GLUCAP in the last 168 hours. D-Dimer No results for  input(s): DDIMER in the last 72 hours. Hgb A1c No results for input(s): HGBA1C in the last 72 hours. Lipid Profile No results for input(s): CHOL, HDL, LDLCALC, TRIG, CHOLHDL, LDLDIRECT in the last 72 hours. Thyroid function studies No results for input(s): TSH, T4TOTAL, T3FREE, THYROIDAB in the last 72 hours.  Invalid input(s): FREET3 Anemia work up No results for input(s): VITAMINB12, FOLATE, FERRITIN, TIBC, IRON, RETICCTPCT in the last 72 hours. Urinalysis    Component Value Date/Time   COLORURINE YELLOW 07/26/2019 1331   APPEARANCEUR CLEAR 07/26/2019 1331   LABSPEC 1.031 (H) 07/26/2019 1331   PHURINE 5.0 07/26/2019 1331   GLUCOSEU NEGATIVE 07/26/2019 1331   HGBUR NEGATIVE 07/26/2019 1331   BILIRUBINUR MODERATE (A) 07/26/2019 1331   KETONESUR 5 (A) 07/26/2019 1331  PROTEINUR 30 (A) 07/26/2019 1331   NITRITE NEGATIVE 07/26/2019 1331   LEUKOCYTESUR NEGATIVE 07/26/2019 1331   Sepsis Labs Invalid input(s): PROCALCITONIN,  WBC,  LACTICIDVEN Microbiology Recent Results (from the past 240 hour(s))  SARS Coronavirus 2 by RT PCR (hospital order, performed in [facility] hospital lab) Nasopharyngeal Nasopharyngeal Swab     Status: None   Collection Time: 07/30/19  6:45 PM   Specimen: Nasopharyngeal Swab  Result Value Ref Range Status   SARS Coronavirus 2 NEGATIVE NEGATIVE Final    Comment: (NOTE) SARS-CoV-2 target nucleic acids are NOT DETECTED. The SARS-CoV-2 RNA is generally detectable in upper and lower respiratory specimens during the acute phase of infection. The lowest concentration of SARS-CoV-2 viral copies this assay can detect is 250 copies / mL. A negative result does not preclude SARS-CoV-2 infection and should not be used as the sole basis for treatment or other patient management decisions.  A negative result may occur with improper specimen collection / handling, submission of specimen other than nasopharyngeal swab, presence of viral mutation(s) within the areas  targeted by this assay, and inadequate number of viral copies (<250 copies / mL). A negative result must be combined with clinical observations, patient history, and epidemiological information. Fact Sheet for Patients:   StrictlyIdeas.no Fact Sheet for Healthcare Providers: BankingDealers.co.za This test is not yet approved or cleared  by the [place] FDA and has been authorized for detection and/or diagnosis of SARS-CoV-2 by FDA under an Emergency Use Authorization (EUA).  This EUA will remain in effect (meaning this test can be used) for the duration of the COVID-19 declaration under Section 564(b)(1) of the Act, 21 U.S.C. section 360bbb-3(b)(1), unless the authorization is terminated or revoked sooner. Performed at [facility], [place] [address], [place], [place] [address]       SIGNED:   Cordelia Poche, MD [facility] 08/04/2019, 11:58 AM

## 2019-08-04 NOTE — Plan of Care (Signed)
  Problem: Education: Goal: Knowledge of General Education information will improve Description Including pain rating scale, medication(s)/side effects and non-pharmacologic comfort measures Outcome: Adequate for Discharge   Problem: Health Behavior/Discharge Planning: Goal: Ability to manage health-related needs will improve Outcome: Adequate for Discharge   

## 2019-08-04 NOTE — Progress Notes (Signed)
Physical Therapy Treatment Patient Details Name: Jamie George MRN: [id number] DOB: June 11, 1945 Today's Date: 08/04/2019    History of Present Illness Pt is a 74 y.o. female with history of hypertension, migraine, dementia, chronic abdominal aortic dissection. Patient presented from her ALF secondary to shortness of breath. This has been a progressive problem for which she has sought repeated medical evaluation.    PT Comments    Pt requesting tylenol on arrival and RN notified.  Pt agreeable to ambulate however then requested return to bed to nap.    Follow Up Recommendations  Home health PT     Equipment Recommendations  None recommended by PT    Recommendations for Other Services       Precautions / Restrictions      Mobility  Bed Mobility Overal bed mobility: Needs Assistance Bed Mobility: Supine to Sit     Supine to sit: Min guard Sit to supine: Min guard   General bed mobility comments: increased time  Transfers Overall transfer level: Needs assistance Equipment used: Rolling walker (2 wheeled) Transfers: Sit to/from Stand Sit to Stand: Min guard         General transfer comment: verbal cues for safe technique  Ambulation/Gait Ambulation/Gait assistance: Min guard Gait Distance (Feet): 80 Feet Assistive device: Rolling walker (2 wheeled) Gait Pattern/deviations: Step-through pattern;Decreased stride length;Trunk flexed Gait velocity: decreased   General Gait Details: Pt with tendency to keep RW too far forward and trunk flexion   Stairs             Wheelchair Mobility    Modified Rankin (Stroke Patients Only)       Balance                                            Cognition Arousal/Alertness: Awake/alert Behavior During Therapy: WFL for tasks assessed/performed Overall Cognitive Status: History of cognitive impairments - at baseline                                        Exercises       General Comments        Pertinent Vitals/Pain Pain Assessment: No/denies pain    Home Living                      Prior Function            PT Goals (current goals can now be found in the care plan section) Progress towards PT goals: Progressing toward goals    Frequency    Min 3X/week      PT Plan Current plan remains appropriate    Co-evaluation              AM-PAC PT "6 Clicks" Mobility   Outcome Measure  Help needed turning from your back to your side while in a flat bed without using bedrails?: A Little Help needed moving from lying on your back to sitting on the side of a flat bed without using bedrails?: A Little Help needed moving to and from a bed to a chair (including a wheelchair)?: A Little Help needed standing up from a chair using your arms (e.g., wheelchair or bedside chair)?: A Little Help needed to walk in hospital room?: A Little Help needed climbing  3-5 steps with a railing? : A Little 6 Click Score: 18    End of Session Equipment Utilized During Treatment: Gait belt Activity Tolerance: Patient tolerated treatment well Patient left: in bed;with call bell/phone within reach;with bed alarm set   PT Visit Diagnosis: Other abnormalities of gait and mobility (R26.89)     Time: 7622-6333 PT Time Calculation (min) (ACUTE ONLY): 13 min  Charges:  $Gait Training: 8-22 mins                    Arlyce Dice, DPT Acute Rehabilitation Services Pager: [phone] Office: [phone]   Trena Platt 08/04/2019, 12:33 PM

## 2019-08-11 DIAGNOSIS — N3941 Urge incontinence: Secondary | ICD-10-CM | POA: Diagnosis not present

## 2019-08-11 DIAGNOSIS — R32 Unspecified urinary incontinence: Secondary | ICD-10-CM | POA: Diagnosis not present

## 2019-08-11 DIAGNOSIS — R350 Frequency of micturition: Secondary | ICD-10-CM | POA: Diagnosis not present

## 2019-11-29 ENCOUNTER — Other Ambulatory Visit: Payer: Self-pay

## 2019-11-29 ENCOUNTER — Emergency Department (HOSPITAL_COMMUNITY): Payer: Medicare PPO

## 2019-11-29 ENCOUNTER — Encounter (HOSPITAL_COMMUNITY): Payer: Self-pay

## 2019-11-29 ENCOUNTER — Inpatient Hospital Stay (HOSPITAL_COMMUNITY)
Admission: EM | Admit: 2019-11-29 | Discharge: 2019-12-01 | DRG: 193 | Disposition: A | Discharge status: Skilled Nursing Facility | Payer: Medicare PPO | Source: Skilled Nursing Facility | Attending: Internal Medicine | Admitting: Internal Medicine

## 2019-11-29 DIAGNOSIS — I129 Hypertensive chronic kidney disease with stage 1 through stage 4 chronic kidney disease, or unspecified chronic kidney disease: Secondary | ICD-10-CM | POA: Diagnosis not present

## 2019-11-29 DIAGNOSIS — R079 Chest pain, unspecified: Secondary | ICD-10-CM | POA: Diagnosis not present

## 2019-11-29 DIAGNOSIS — K219 Gastro-esophageal reflux disease without esophagitis: Secondary | ICD-10-CM | POA: Diagnosis present

## 2019-11-29 DIAGNOSIS — R0902 Hypoxemia: Secondary | ICD-10-CM | POA: Diagnosis not present

## 2019-11-29 DIAGNOSIS — G4700 Insomnia, unspecified: Secondary | ICD-10-CM | POA: Diagnosis present

## 2019-11-29 DIAGNOSIS — J189 Pneumonia, unspecified organism: Secondary | ICD-10-CM | POA: Diagnosis not present

## 2019-11-29 DIAGNOSIS — F419 Anxiety disorder, unspecified: Secondary | ICD-10-CM | POA: Diagnosis present

## 2019-11-29 DIAGNOSIS — Z79890 Hormone replacement therapy (postmenopausal): Secondary | ICD-10-CM | POA: Diagnosis not present

## 2019-11-29 DIAGNOSIS — G40909 Epilepsy, unspecified, not intractable, without status epilepticus: Secondary | ICD-10-CM | POA: Diagnosis not present

## 2019-11-29 DIAGNOSIS — Z7982 Long term (current) use of aspirin: Secondary | ICD-10-CM

## 2019-11-29 DIAGNOSIS — E876 Hypokalemia: Secondary | ICD-10-CM | POA: Diagnosis not present

## 2019-11-29 DIAGNOSIS — J9811 Atelectasis: Secondary | ICD-10-CM | POA: Diagnosis not present

## 2019-11-29 DIAGNOSIS — I10 Essential (primary) hypertension: Secondary | ICD-10-CM | POA: Diagnosis not present

## 2019-11-29 DIAGNOSIS — Z87891 Personal history of nicotine dependence: Secondary | ICD-10-CM

## 2019-11-29 DIAGNOSIS — F0390 Unspecified dementia without behavioral disturbance: Secondary | ICD-10-CM | POA: Diagnosis present

## 2019-11-29 DIAGNOSIS — Z79899 Other long term (current) drug therapy: Secondary | ICD-10-CM | POA: Diagnosis not present

## 2019-11-29 DIAGNOSIS — R0602 Shortness of breath: Secondary | ICD-10-CM | POA: Diagnosis not present

## 2019-11-29 DIAGNOSIS — J9601 Acute respiratory failure with hypoxia: Secondary | ICD-10-CM | POA: Diagnosis not present

## 2019-11-29 DIAGNOSIS — Z823 Family history of stroke: Secondary | ICD-10-CM

## 2019-11-29 DIAGNOSIS — Z8249 Family history of ischemic heart disease and other diseases of the circulatory system: Secondary | ICD-10-CM | POA: Diagnosis not present

## 2019-11-29 DIAGNOSIS — Z20822 Contact with and (suspected) exposure to covid-19: Secondary | ICD-10-CM | POA: Diagnosis not present

## 2019-11-29 DIAGNOSIS — I700 Atherosclerosis of aorta: Secondary | ICD-10-CM | POA: Diagnosis not present

## 2019-11-29 DIAGNOSIS — Z7401 Bed confinement status: Secondary | ICD-10-CM | POA: Diagnosis not present

## 2019-11-29 DIAGNOSIS — I2510 Atherosclerotic heart disease of native coronary artery without angina pectoris: Secondary | ICD-10-CM | POA: Diagnosis not present

## 2019-11-29 DIAGNOSIS — M2550 Pain in unspecified joint: Secondary | ICD-10-CM | POA: Diagnosis not present

## 2019-11-29 DIAGNOSIS — N1832 Chronic kidney disease, stage 3b: Secondary | ICD-10-CM | POA: Diagnosis present

## 2019-11-29 DIAGNOSIS — M19011 Primary osteoarthritis, right shoulder: Secondary | ICD-10-CM | POA: Diagnosis not present

## 2019-11-29 DIAGNOSIS — R069 Unspecified abnormalities of breathing: Secondary | ICD-10-CM | POA: Diagnosis not present

## 2019-11-29 HISTORY — DX: Essential (primary) hypertension: I10

## 2019-11-29 LAB — CBC WITH DIFFERENTIAL/PLATELET
Abs Immature Granulocytes: 0.04 10*3/uL (ref 0.00–0.07)
Basophils Absolute: 0.1 10*3/uL (ref 0.0–0.1)
Basophils Relative: 1 %
Eosinophils Absolute: 0.4 10*3/uL (ref 0.0–0.5)
Eosinophils Relative: 4 %
HCT: 52.2 % — ABNORMAL HIGH (ref 36.0–46.0)
Hemoglobin: 16.4 g/dL — ABNORMAL HIGH (ref 12.0–15.0)
Immature Granulocytes: 0 %
Lymphocytes Relative: 20 %
Lymphs Abs: 1.8 10*3/uL (ref 0.7–4.0)
MCH: 29.4 pg (ref 26.0–34.0)
MCHC: 31.4 g/dL (ref 30.0–36.0)
MCV: 93.7 fL (ref 80.0–100.0)
Monocytes Absolute: 1 10*3/uL (ref 0.1–1.0)
Monocytes Relative: 10 %
Neutro Abs: 5.9 10*3/uL (ref 1.7–7.7)
Neutrophils Relative %: 65 %
Platelets: 283 10*3/uL (ref 150–400)
RBC: 5.57 MIL/uL — ABNORMAL HIGH (ref 3.87–5.11)
RDW: 15 % (ref 11.5–15.5)
WBC: 9.2 10*3/uL (ref 4.0–10.5)
nRBC: 0 % (ref 0.0–0.2)

## 2019-11-29 LAB — CBC
HCT: 49.8 % — ABNORMAL HIGH (ref 36.0–46.0)
Hemoglobin: 15.9 g/dL — ABNORMAL HIGH (ref 12.0–15.0)
MCH: 29.6 pg (ref 26.0–34.0)
MCHC: 31.9 g/dL (ref 30.0–36.0)
MCV: 92.7 fL (ref 80.0–100.0)
Platelets: 277 10*3/uL (ref 150–400)
RBC: 5.37 MIL/uL — ABNORMAL HIGH (ref 3.87–5.11)
RDW: 14.8 % (ref 11.5–15.5)
WBC: 9.8 10*3/uL (ref 4.0–10.5)
nRBC: 0 % (ref 0.0–0.2)

## 2019-11-29 LAB — BLOOD GAS, ARTERIAL
Acid-base deficit: 0.6 mmol/L (ref 0.0–2.0)
Bicarbonate: 23.9 mmol/L (ref 20.0–28.0)
Drawn by: 560031
FIO2: 40
O2 Content: 5 L/min
O2 Saturation: 96.8 %
Patient temperature: 97.7
pCO2 arterial: 39.9 mmHg (ref 32.0–48.0)
pH, Arterial: 7.393 (ref 7.350–7.450)
pO2, Arterial: 80.6 mmHg — ABNORMAL LOW (ref 83.0–108.0)

## 2019-11-29 LAB — COMPREHENSIVE METABOLIC PANEL
ALT: 10 U/L (ref 0–44)
AST: 15 U/L (ref 15–41)
Albumin: 3.5 g/dL (ref 3.5–5.0)
Alkaline Phosphatase: 55 U/L (ref 38–126)
Anion gap: 9 (ref 5–15)
BUN: 17 mg/dL (ref 8–23)
CO2: 24 mmol/L (ref 22–32)
Calcium: 8.8 mg/dL — ABNORMAL LOW (ref 8.9–10.3)
Chloride: 109 mmol/L (ref 98–111)
Creatinine, Ser: 1.29 mg/dL — ABNORMAL HIGH (ref 0.44–1.00)
GFR, Estimated: 40 mL/min — ABNORMAL LOW (ref >60)
Glucose, Bld: 93 mg/dL (ref 70–99)
Potassium: 3.6 mmol/L (ref 3.5–5.1)
Sodium: 142 mmol/L (ref 135–145)
Total Bilirubin: 0.3 mg/dL (ref 0.3–1.2)
Total Protein: 6.3 g/dL — ABNORMAL LOW (ref 6.5–8.1)

## 2019-11-29 LAB — LACTIC ACID, PLASMA
Lactic Acid, Venous: 2 mmol/L (ref 0.5–1.9)
Lactic Acid, Venous: 0.9 mmol/L (ref 0.5–1.9)

## 2019-11-29 LAB — LACTATE DEHYDROGENASE: LDH: 123 U/L (ref 98–192)

## 2019-11-29 LAB — CREATININE, SERUM
Creatinine, Ser: 1.28 mg/dL — ABNORMAL HIGH (ref 0.44–1.00)
GFR, Estimated: 41 mL/min — ABNORMAL LOW (ref >60)

## 2019-11-29 LAB — PROCALCITONIN: Procalcitonin: <0.1 ng/mL

## 2019-11-29 LAB — RESPIRATORY PANEL BY RT PCR (FLU A&B, COVID)
Influenza A by PCR: NEGATIVE
Influenza B by PCR: NEGATIVE
SARS Coronavirus 2 by RT PCR: NEGATIVE

## 2019-11-29 LAB — FERRITIN: Ferritin: 231 ng/mL (ref 11–307)

## 2019-11-29 LAB — BRAIN NATRIURETIC PEPTIDE: B Natriuretic Peptide: 55.1 pg/mL (ref 0.0–100.0)

## 2019-11-29 LAB — TROPONIN I (HIGH SENSITIVITY)
Troponin I (High Sensitivity): 3 ng/L (ref <18)
Troponin I (High Sensitivity): 3 ng/L (ref <18)

## 2019-11-29 LAB — C-REACTIVE PROTEIN: CRP: <0.5 mg/dL (ref <1.0)

## 2019-11-29 LAB — FIBRINOGEN: Fibrinogen: 352 mg/dL (ref 210–475)

## 2019-11-29 LAB — TRIGLYCERIDES: Triglycerides: 183 mg/dL — ABNORMAL HIGH (ref <150)

## 2019-11-29 LAB — LIPASE, BLOOD: Lipase: 39 U/L (ref 11–51)

## 2019-11-29 MED ORDER — RISPERIDONE 1 MG PO TABS
1.0000 mg | ORAL_TABLET | Freq: Every day | ORAL | Status: DC
  Administered 2019-11-29 – 2019-11-30 (×2): 1 mg via ORAL
  Filled 2019-11-29 (×2): qty 1

## 2019-11-29 MED ORDER — PANTOPRAZOLE SODIUM 40 MG PO TBEC
40.0000 mg | DELAYED_RELEASE_TABLET | Freq: Two times a day (BID) | ORAL | Status: DC
  Administered 2019-11-29 – 2019-12-01 (×4): 40 mg via ORAL
  Filled 2019-11-29 (×4): qty 1

## 2019-11-29 MED ORDER — IPRATROPIUM-ALBUTEROL 0.5-2.5 (3) MG/3ML IN SOLN
3.0000 mL | Freq: Four times a day (QID) | RESPIRATORY_TRACT | Status: DC
  Administered 2019-11-29: 3 mL via RESPIRATORY_TRACT
  Filled 2019-11-29: qty 3

## 2019-11-29 MED ORDER — ESCITALOPRAM OXALATE 20 MG PO TABS
20.0000 mg | ORAL_TABLET | Freq: Every day | ORAL | Status: DC
  Administered 2019-11-30 – 2019-12-01 (×2): 20 mg via ORAL
  Filled 2019-11-29 (×2): qty 1

## 2019-11-29 MED ORDER — ACETAMINOPHEN 325 MG PO TABS
650.0000 mg | ORAL_TABLET | Freq: Four times a day (QID) | ORAL | Status: DC | PRN
  Administered 2019-11-29 – 2019-12-01 (×7): 650 mg via ORAL
  Filled 2019-11-29 (×7): qty 2

## 2019-11-29 MED ORDER — ASPIRIN EC 81 MG PO TBEC
81.0000 mg | DELAYED_RELEASE_TABLET | Freq: Every day | ORAL | Status: DC
  Administered 2019-11-30 – 2019-12-01 (×2): 81 mg via ORAL
  Filled 2019-11-29 (×2): qty 1

## 2019-11-29 MED ORDER — DOCUSATE SODIUM 100 MG PO CAPS
100.0000 mg | ORAL_CAPSULE | Freq: Every day | ORAL | Status: DC
  Administered 2019-11-30 – 2019-12-01 (×2): 100 mg via ORAL
  Filled 2019-11-29 (×2): qty 1

## 2019-11-29 MED ORDER — VERAPAMIL HCL 120 MG PO TABS
120.0000 mg | ORAL_TABLET | Freq: Every day | ORAL | Status: DC
  Administered 2019-11-30 – 2019-12-01 (×2): 120 mg via ORAL
  Filled 2019-11-29 (×2): qty 1

## 2019-11-29 MED ORDER — MEMANTINE HCL 10 MG PO TABS
10.0000 mg | ORAL_TABLET | Freq: Two times a day (BID) | ORAL | Status: DC
  Administered 2019-11-29 – 2019-12-01 (×4): 10 mg via ORAL
  Filled 2019-11-29: qty 1
  Filled 2019-11-29: qty 2
  Filled 2019-11-29 (×2): qty 1

## 2019-11-29 MED ORDER — ONDANSETRON HCL 4 MG PO TABS: 4.0000 mg | ORAL_TABLET | Freq: Four times a day (QID) | ORAL | Status: DC | PRN

## 2019-11-29 MED ORDER — SODIUM CHLORIDE 0.9 % IV SOLN
500.0000 mg | INTRAVENOUS | Status: DC
  Administered 2019-11-29 – 2019-11-30 (×2): 500 mg via INTRAVENOUS
  Filled 2019-11-29 (×3): qty 500

## 2019-11-29 MED ORDER — ZONISAMIDE 100 MG PO CAPS
200.0000 mg | ORAL_CAPSULE | Freq: Every day | ORAL | Status: DC
  Administered 2019-11-29 – 2019-11-30 (×2): 200 mg via ORAL
  Filled 2019-11-29 (×3): qty 2

## 2019-11-29 MED ORDER — IOHEXOL 300 MG/ML  SOLN: 80.0000 mL | Freq: Once | INTRAMUSCULAR | Status: DC | PRN

## 2019-11-29 MED ORDER — ALBUTEROL SULFATE HFA 108 (90 BASE) MCG/ACT IN AERS
2.0000 | INHALATION_SPRAY | Freq: Four times a day (QID) | RESPIRATORY_TRACT | Status: DC | PRN
  Administered 2019-12-01: 2 via RESPIRATORY_TRACT
  Filled 2019-11-29: qty 6.7

## 2019-11-29 MED ORDER — POLYETHYLENE GLYCOL 3350 17 G PO PACK
17.0000 g | PACK | Freq: Every day | ORAL | Status: DC
  Administered 2019-11-30 – 2019-12-01 (×2): 17 g via ORAL
  Filled 2019-11-29 (×2): qty 1

## 2019-11-29 MED ORDER — MELATONIN 5 MG PO TABS
10.0000 mg | ORAL_TABLET | Freq: Every day | ORAL | Status: DC
  Administered 2019-11-29 – 2019-11-30 (×2): 10 mg via ORAL
  Filled 2019-11-29 (×2): qty 2

## 2019-11-29 MED ORDER — HEPARIN SODIUM (PORCINE) 5000 UNIT/ML IJ SOLN
5000.0000 [IU] | Freq: Three times a day (TID) | INTRAMUSCULAR | Status: DC
  Administered 2019-11-29 – 2019-12-01 (×6): 5000 [IU] via SUBCUTANEOUS
  Filled 2019-11-29 (×6): qty 1

## 2019-11-29 MED ORDER — DONEPEZIL HCL 10 MG PO TABS
10.0000 mg | ORAL_TABLET | Freq: Every day | ORAL | Status: DC
  Administered 2019-11-29 – 2019-11-30 (×2): 10 mg via ORAL
  Filled 2019-11-29: qty 1
  Filled 2019-11-29: qty 2

## 2019-11-29 MED ORDER — ONDANSETRON HCL 4 MG/2ML IJ SOLN: 4.0000 mg | Freq: Four times a day (QID) | INTRAMUSCULAR | Status: DC | PRN

## 2019-11-29 MED ORDER — SODIUM CHLORIDE (PF) 0.9 % IJ SOLN
INTRAMUSCULAR | Status: AC
  Filled 2019-11-29: qty 50

## 2019-11-29 MED ORDER — CALCIUM CARBONATE ANTACID 500 MG PO CHEW: 2.0000 | CHEWABLE_TABLET | Freq: Two times a day (BID) | ORAL | Status: DC | PRN

## 2019-11-29 MED ORDER — FERROUS SULFATE 325 (65 FE) MG PO TABS
325.0000 mg | ORAL_TABLET | Freq: Two times a day (BID) | ORAL | Status: DC
  Administered 2019-11-30 – 2019-12-01 (×4): 325 mg via ORAL
  Filled 2019-11-29 (×4): qty 1

## 2019-11-29 MED ORDER — IOHEXOL 350 MG/ML SOLN
80.0000 mL | Freq: Once | INTRAVENOUS | Status: AC | PRN
  Administered 2019-11-29: 80 mL via INTRAVENOUS

## 2019-11-29 MED ORDER — MIRTAZAPINE 15 MG PO TABS
7.5000 mg | ORAL_TABLET | Freq: Every day | ORAL | Status: DC
  Administered 2019-11-29 – 2019-11-30 (×2): 7.5 mg via ORAL
  Filled 2019-11-29 (×2): qty 1

## 2019-11-29 MED ORDER — TRAZODONE HCL 50 MG PO TABS
50.0000 mg | ORAL_TABLET | Freq: Every day | ORAL | Status: DC
  Administered 2019-11-29 – 2019-11-30 (×2): 50 mg via ORAL
  Filled 2019-11-29 (×2): qty 1

## 2019-11-29 MED ORDER — ACETAMINOPHEN 650 MG RE SUPP: 650.0000 mg | Freq: Four times a day (QID) | RECTAL | Status: DC | PRN

## 2019-11-29 MED ORDER — FLUTICASONE PROPIONATE 50 MCG/ACT NA SUSP
1.0000 | Freq: Every day | NASAL | Status: DC
  Administered 2019-12-01: 1 via NASAL
  Filled 2019-11-29: qty 16

## 2019-11-29 MED ORDER — SODIUM CHLORIDE 0.9 % IV SOLN
2.0000 g | INTRAVENOUS | Status: DC
  Administered 2019-11-29 – 2019-11-30 (×2): 2 g via INTRAVENOUS
  Filled 2019-11-29: qty 20
  Filled 2019-11-29: qty 2
  Filled 2019-11-29: qty 20

## 2019-11-29 NOTE — H&P (Signed)
History and Physical    Jamie George ERD:[id number] DOB: 1945/06/17 DOA: 11/29/2019  PCP: Kristen Loader, FNP  Patient coming from: [facility]  Chief Complaint: Dyspnea  HPI: Jamie George is a 74 y.o. female with medical history significant of dementia. Presenting w/ c/o dyspnea. History is from ALF staff and patient. Per ALF staff, patient woke this morning with c/o shortness of breath. They report that she regularly does this as a way to get tylenol. She usually settles down after they tell her no tylenol and give her an inhaler. However, this morning, she did not calm down. Per the patient, she had persistent shortness of breath throughout the night without relief from inhaler use. She denies any chest pain, palpitations, or cough. She became concerned when the inhalers did not work. The facility and the patient agreed an ED visit was necessary.   ED Course: BNP and troponins were negative. Initial COVID screen was negative. CTA PE showed possible bibasilar consolidation. On re-examination, patient dropped her O2 sat to the 80's while on RA. TRH was called for admission.   Review of Systems:  Review of systems is otherwise negative for all not mentioned in HPI.   PMHx Past Medical History:  Diagnosis Date  . Cancer ([place])    skin  . Complication of anesthesia    " difficult to wake me up "  . Depression   . Duodenitis   . GERD (gastroesophageal reflux disease)   . Hypertension   . Insomnia   . Migraines   . Mild dementia ([place])     PSHx Past Surgical History:  Procedure Laterality Date  . ABDOMINAL HYSTERECTOMY    . BACK SURGERY    . BIOPSY  03/04/2018   Procedure: BIOPSY;  Surgeon: Otis Brace, MD;  Location: [facility];  Service: Gastroenterology;;  . BRAIN SURGERY  2013   meningioma  . ENTEROSCOPY N/A 03/04/2018   Procedure: PUSH ENTEROSCOPY;  Surgeon: Otis Brace, MD;  Location: [facility];  Service: Gastroenterology;  Laterality: N/A;   . SHOULDER SURGERY      SocHx  reports that she has quit smoking. She has quit using smokeless tobacco. She reports previous alcohol use. She reports that she does not use drugs.  No Known Allergies  FamHx Family History  Problem Relation Age of Onset  . Cancer Mother   . Stroke Father   . Heart disease Father     Prior to Admission medications   Medication Sig Start Date End Date Taking? Authorizing Provider  acetaminophen (TYLENOL) 325 MG tablet Take 650 mg by mouth in the morning and at bedtime.     [provider]  aspirin EC 81 MG EC tablet Take 1 tablet (81 mg total) by mouth daily. With food 08/15/17   Roxan Hockey, MD  atorvastatin (LIPITOR) 40 MG tablet Take 40 mg by mouth daily.     [provider]  docusate sodium (COLACE) 100 MG capsule Take 100 mg by mouth daily.    [provider]  donepezil (ARICEPT) 10 MG tablet Take 10 mg by mouth at bedtime.    [provider]  escitalopram (LEXAPRO) 20 MG tablet Take 20 mg by mouth daily.  05/03/17   [provider]  ferrous sulfate 325 (65 FE) MG tablet Take 325 mg by mouth 2 (two) times daily with a meal.    [provider]  fluticasone (FLONASE) 50 MCG/ACT nasal spray Place 1 spray into both nostrils daily.  [provider]  melatonin 5 MG TABS Take 10 mg by mouth at bedtime.    [provider]  memantine (NAMENDA) 10 MG tablet Take 1 tablet (10 mg total) by mouth 2 (two) times daily. 12/17/17   Penumalli, Earlean Polka, MD  mirtazapine (REMERON) 7.5 MG tablet Take 7.5 mg by mouth at bedtime.    [provider]  pantoprazole (PROTONIX) 40 MG tablet Take 40 mg by mouth daily.     [provider]  polyethylene glycol (MIRALAX / GLYCOLAX) packet Take 17 g by mouth daily.    [provider]  risperiDONE (RISPERDAL) 0.5 MG tablet Take 1 mg by mouth at bedtime.     [provider]  traZODone (DESYREL) 50 MG tablet Take 50 mg by  mouth at bedtime.  05/03/17   [provider]  verapamil (CALAN-SR) 120 MG CR tablet Take 120 mg by mouth daily.    [provider]  zonisamide (ZONEGRAN) 100 MG capsule Take 200 mg by mouth at bedtime.     [provider]    Physical Exam: Vitals:   11/29/19 1103 11/29/19 1245 11/29/19 1315 11/29/19 1321  BP: 137/72 126/71    Pulse: 60 (!) 57 64   Resp: 15 14 15    Temp: 97.7 F (36.5 C)     TempSrc: Oral     SpO2: 100% 98% 98% 98%    General: 74 y.o. female resting in bed in NAD Eyes: PERRL, normal sclera ENMT: Nares patent w/o discharge, orophaynx clear, dentition normal, ears w/o discharge/lesions/ulcers Neck: Supple, trachea midline Cardiovascular: RRR, +S1, S2, no m/g/r, equal pulses throughout Respiratory: Decreased at bases, soft expiratory wheeze on right anteriorly, normal WOB on 5L [place] GI: BS+, NDNT, no masses noted, no organomegaly noted MSK: No e/c/c Skin: No rashes, bruises, ulcerations noted Neuro: A&O x 3, no focal deficits Psyc: Appropriate interaction but flat affect, calm/cooperative  Labs on Admission: I have personally reviewed following labs and imaging studies  CBC: Recent Labs  Lab 11/29/19 1204  WBC 9.2  NEUTROABS 5.9  HGB 16.4*  HCT 52.2*  MCV 93.7  PLT 098   Basic Metabolic Panel: Recent Labs  Lab 11/29/19 1204  NA 142  K 3.6  CL 109  CO2 24  GLUCOSE 93  BUN 17  CREATININE 1.29*  CALCIUM 8.8*   GFR: CrCl cannot be calculated (Unknown ideal weight.). Liver Function Tests: Recent Labs  Lab 11/29/19 1204  AST 15  ALT 10  ALKPHOS 55  BILITOT 0.3  PROT 6.3*  ALBUMIN 3.5   Recent Labs  Lab 11/29/19 1204  LIPASE 39   No results for input(s): AMMONIA in the last 168 hours. Coagulation Profile: No results for input(s): INR, PROTIME in the last 168 hours. Cardiac Enzymes: No results for input(s): CKTOTAL, CKMB, CKMBINDEX, TROPONINI in the last 168 hours. BNP (last 3 results) No results for  input(s): PROBNP in the last 8760 hours. HbA1C: No results for input(s): HGBA1C in the last 72 hours. CBG: No results for input(s): GLUCAP in the last 168 hours. Lipid Profile: Recent Labs    11/29/19 1518  TRIG 183*   Thyroid Function Tests: No results for input(s): TSH, T4TOTAL, FREET4, T3FREE, THYROIDAB in the last 72 hours. Anemia Panel: No results for input(s): VITAMINB12, FOLATE, FERRITIN, TIBC, IRON, RETICCTPCT in the last 72 hours. Urine analysis:    Component Value Date/Time   COLORURINE YELLOW 07/26/2019 1331   APPEARANCEUR CLEAR 07/26/2019 1331   LABSPEC 1.031 (H) 07/26/2019  [place] 5.0 07/26/2019 1331   GLUCOSEU NEGATIVE 07/26/2019 1331   HGBUR NEGATIVE 07/26/2019 1331   BILIRUBINUR MODERATE (A) 07/26/2019 1331   KETONESUR 5 (A) 07/26/2019 1331   PROTEINUR 30 (A) 07/26/2019 1331   NITRITE NEGATIVE 07/26/2019 1331   LEUKOCYTESUR NEGATIVE 07/26/2019 1331    Radiological Exams on Admission: CT Angio Chest PE W and/or Wo Contrast  Result Date: 11/29/2019 CLINICAL DATA:  Chest pain, shortness of breath, hemoptysis, high prob PE EXAM: CT ANGIOGRAPHY CHEST WITH CONTRAST TECHNIQUE: Multidetector CT imaging of the chest was performed using the standard protocol during bolus administration of intravenous contrast. Multiplanar CT image reconstructions and MIPs were obtained to evaluate the vascular anatomy. CONTRAST:  32mL OMNIPAQUE IOHEXOL 350 MG/ML SOLN COMPARISON:  07/26/2019 FINDINGS: Cardiovascular: Heart size upper limits normal. No pericardial effusion. Satisfactory opacification of pulmonary arteries noted, and there is no evidence of pulmonary emboli. Adequate contrast opacification of the thoracic aorta with no evidence of dissection, aneurysm, or stenosis. There is classic 3-vessel brachiocephalic arch anatomy without proximal stenosis. Moderate calcified plaque in the arch and descending thoracic segment. Mediastinum/Nodes: No mass or adenopathy. Lungs/Pleura: No  pleural effusion. No pneumothorax. Some worsening of atelectasis/consolidation posteriorly at both lung bases, and in the dependent aspect of the lingula and lower lobes. Upper Abdomen: No acute findings Musculoskeletal: Bilateral shoulder DJD. Negative for fracture or worrisome bone lesion. Review of the MIP images confirms the above findings. IMPRESSION: 1. Negative for acute PE or thoracic aortic dissection. 2. Some worsening of atelectasis/consolidation posteriorly at both lung bases, and in the dependent aspect of the lingula and lower lobes. Aortic Atherosclerosis (ICD10-I70.0). Electronically Signed   By: Lucrezia Europe M.D.   On: 11/29/2019 14:03    EKG: Independently reviewed. Sinus, no ST changes  Assessment/Plan Dyspnea/Hypoxia Bibasilar pneumonia     - admit to inpatient, med-surg     - CT scan this time shows possible bibasilar PNA and her sats dropped into the 80's during conversation/examination with EDP     - will start CAP coverage     - ABG is ok     - CTA w/o clot, EKG sinus, no ST changes; she does not complain of CP     - BNP is ok, last echo 6/21 w/ EF 55 - 60%     - add duonebs, IS  Dementia     - lives at a memory care facility     - on risperdal, aricept, namenda, remeron, lexapro  HTN     - on verapamil; continue  CKD 3b     - baseline Scr is about 1.1 - 1.3. She is at 1.29 at admission     - watch nephrotoxins  GERD     - protonix  DVT prophylaxis: heparin  Code Status: FULL  Family Communication: None at bedside  Consults called: None  Admission status: Inpatient  Status is: Inpatient  Remains inpatient appropriate because:severity of illness and ongoing elevated O2 need.    Dispo: The patient is from: ALF              Anticipated d/c is to: ALF              Anticipated d/c date is: 2 days              Patient currently is not medically stable to d/c.  Jonnie Finner DO [facility]  If 7PM-7AM, please contact  night-coverage [email or web address]  11/29/2019, 5:43 PM

## 2019-11-29 NOTE — ED Provider Notes (Signed)
[place] DEPT Provider Note   CSN: [id number] Arrival date & time: 11/29/19  1038     History Chief Complaint  Patient presents with  . Shortness of Breath    Jamie George is a 74 y.o. female.  The history is provided by the patient and medical records. No language interpreter was used.  Shortness of Breath Severity:  Moderate Onset quality:  Gradual Duration:  1 week Timing:  Intermittent Progression:  Waxing and waning Chronicity:  Recurrent Relieved by:  Nothing Worsened by:  Deep breathing Ineffective treatments:  None tried Associated symptoms: chest pain and cough   Associated symptoms: no abdominal pain, no diaphoresis, no fever, no headaches, no neck pain, no sputum production, no vomiting and no wheezing        Past Medical History:  Diagnosis Date  . Cancer ([facility])    skin  . Complication of anesthesia    " difficult to wake me up "  . Depression   . Duodenitis   . GERD (gastroesophageal reflux disease)   . Insomnia   . Migraines   . Mild dementia [facility])     Patient Active Problem List   Diagnosis Date Noted  . Dyspnea 07/30/2019  . Dementia ([place]) 01/02/2018  . Aggression 01/02/2018  . Dissection of abdominal aorta ([place]) 08/13/2017  . Chest pain 08/13/2017  . Hypertension 08/13/2017  . CKD (chronic kidney disease) 08/13/2017  . Anemia 08/13/2017    Past Surgical History:  Procedure Laterality Date  . ABDOMINAL HYSTERECTOMY    . BACK SURGERY    . BIOPSY  03/04/2018   Procedure: BIOPSY;  Surgeon: Otis Brace, MD;  Location: [facility];  Service: Gastroenterology;;  . BRAIN SURGERY  2013   meningioma  . ENTEROSCOPY N/A 03/04/2018   Procedure: PUSH ENTEROSCOPY;  Surgeon: Otis Brace, MD;  Location: [facility];  Service: Gastroenterology;  Laterality: N/A;  . SHOULDER SURGERY       OB History   No obstetric history on file.     Family History  Problem Relation Age of Onset  . Cancer  Mother   . Stroke Father   . Heart disease Father     Social History   Tobacco Use  . Smoking status: Former Research scientist (life sciences)  . Smokeless tobacco: Former Network engineer  . Vaping Use: Never used  Substance Use Topics  . Alcohol use: Not Currently  . Drug use: Never    Home Medications Prior to Admission medications   Medication Sig Start Date End Date Taking? Authorizing Provider  acetaminophen (TYLENOL) 325 MG tablet Take 650 mg by mouth in the morning and at bedtime.     [provider]  aspirin EC 81 MG EC tablet Take 1 tablet (81 mg total) by mouth daily. With food 08/15/17   Roxan Hockey, MD  atorvastatin (LIPITOR) 40 MG tablet Take 40 mg by mouth daily.     [provider]  docusate sodium (COLACE) 100 MG capsule Take 100 mg by mouth daily.    [provider]  donepezil (ARICEPT) 10 MG tablet Take 10 mg by mouth at bedtime.    [provider]  escitalopram (LEXAPRO) 20 MG tablet Take 20 mg by mouth daily.  05/03/17   [provider]  ferrous sulfate 325 (65 FE) MG tablet Take 325 mg by mouth 2 (two) times daily with a meal.    [provider]  fluticasone (FLONASE) 50 MCG/ACT nasal spray Place 1 spray into  both nostrils daily.    [provider]  melatonin 5 MG TABS Take 10 mg by mouth at bedtime.    [provider]  memantine (NAMENDA) 10 MG tablet Take 1 tablet (10 mg total) by mouth 2 (two) times daily. 12/17/17   Penumalli, Earlean Polka, MD  mirtazapine (REMERON) 7.5 MG tablet Take 7.5 mg by mouth at bedtime.    [provider]  pantoprazole (PROTONIX) 40 MG tablet Take 40 mg by mouth daily.     [provider]  polyethylene glycol (MIRALAX / GLYCOLAX) packet Take 17 g by mouth daily.    [provider]  risperiDONE (RISPERDAL) 0.5 MG tablet Take 1 mg by mouth at bedtime.     [provider]  traZODone (DESYREL) 50 MG tablet Take 50 mg by mouth at bedtime.  05/03/17    [provider]  verapamil (CALAN-SR) 120 MG CR tablet Take 120 mg by mouth daily.    [provider]  zonisamide (ZONEGRAN) 100 MG capsule Take 200 mg by mouth at bedtime.     [provider]    Allergies    Patient has no known allergies.  Review of Systems   Review of Systems  Constitutional: Negative for chills, diaphoresis, fatigue and fever.  HENT: Negative for congestion.   Respiratory: Positive for cough, chest tightness and shortness of breath. Negative for sputum production, wheezing and stridor.   Cardiovascular: Positive for chest pain. Negative for palpitations and leg swelling.  Gastrointestinal: Negative for abdominal pain, constipation, diarrhea, nausea and vomiting.  Genitourinary: Negative for dysuria and flank pain.  Musculoskeletal: Negative for back pain, neck pain and neck stiffness.  Neurological: Negative for weakness, light-headedness, numbness and headaches.  Psychiatric/Behavioral: Negative for agitation and confusion.  All other systems reviewed and are negative.   Physical Exam Updated Vital Signs BP 137/72 (BP Location: Right Arm)   Pulse 60   Temp 97.7 F (36.5 C) (Oral)   Resp 15   SpO2 100%   Physical Exam Vitals and nursing note reviewed.  Constitutional:      General: She is not in acute distress.    Appearance: She is well-developed. She is not ill-appearing, toxic-appearing or diaphoretic.  HENT:     Head: Normocephalic and atraumatic.     Mouth/Throat:     Mouth: Mucous membranes are moist.     Pharynx: No oropharyngeal exudate.  Eyes:     Conjunctiva/sclera: Conjunctivae normal.     Pupils: Pupils are equal, round, and reactive to light.  Cardiovascular:     Rate and Rhythm: Normal rate and regular rhythm.     Heart sounds: No murmur heard.   Pulmonary:     Effort: Pulmonary effort is normal. Tachypnea present. No respiratory distress.     Breath sounds: Rhonchi present. No decreased breath sounds,  wheezing or rales.  Chest:     Chest wall: No tenderness.  Abdominal:     Palpations: Abdomen is soft.     Tenderness: There is no abdominal tenderness.  Musculoskeletal:     Cervical back: Neck supple.     Right lower leg: No tenderness. No edema.     Left lower leg: No tenderness. No edema.  Skin:    General: Skin is warm and dry.     Capillary Refill: Capillary refill takes less than 2 seconds.     Coloration: Skin is not pale.     Findings: No erythema.  Neurological:     General: No  focal deficit present.     Mental Status: She is alert.  Psychiatric:        Mood and Affect: Mood normal.     ED Results / Procedures / Treatments   Labs (all labs ordered are listed, but only abnormal results are displayed) Labs Reviewed  CBC WITH DIFFERENTIAL/PLATELET - Abnormal; Notable for the following components:      Result Value   RBC 5.57 (*)    Hemoglobin 16.4 (*)    HCT 52.2 (*)    All other components within normal limits  COMPREHENSIVE METABOLIC PANEL - Abnormal; Notable for the following components:   Creatinine, Ser 1.29 (*)    Calcium 8.8 (*)    Total Protein 6.3 (*)    GFR, Estimated 40 (*)    All other components within normal limits  LACTIC ACID, PLASMA - Abnormal; Notable for the following components:   Lactic Acid, Venous 2.0 (*)    All other components within normal limits  TRIGLYCERIDES - Abnormal; Notable for the following components:   Triglycerides 183 (*)    All other components within normal limits  BLOOD GAS, ARTERIAL - Abnormal; Notable for the following components:   pO2, Arterial 80.6 (*)    All other components within normal limits  RESPIRATORY PANEL BY RT PCR (FLU A&B, COVID)  CULTURE, BLOOD (ROUTINE X 2)  CULTURE, BLOOD (ROUTINE X 2)  LACTIC ACID, PLASMA  LIPASE, BLOOD  BRAIN NATRIURETIC PEPTIDE  PROCALCITONIN  LACTATE DEHYDROGENASE  FERRITIN  FIBRINOGEN  C-REACTIVE PROTEIN  CBC  CREATININE, SERUM  LEGIONELLA PNEUMOPHILA SEROGP 1 UR AG   STREP PNEUMONIAE URINARY ANTIGEN  COMPREHENSIVE METABOLIC PANEL  CBC  TROPONIN I (HIGH SENSITIVITY)  TROPONIN I (HIGH SENSITIVITY)    EKG EKG Interpretation  Date/Time:  Saturday November 29 2019 11:16:08 EDT Ventricular Rate:  66 PR Interval:    QRS Duration: 73 QT Interval:  423 QTC Calculation: 444 R Axis:   80 Text Interpretation: Sinus rhythm Borderline low voltage, extremity leads When compared to prior, similar apperance. No STEMI Confirmed by Antony Blackbird [phone]) on 11/29/2019 11:17:23 AM   Radiology CT Angio Chest PE W and/or Wo Contrast  Result Date: 11/29/2019 CLINICAL DATA:  Chest pain, shortness of breath, hemoptysis, high prob PE EXAM: CT ANGIOGRAPHY CHEST WITH CONTRAST TECHNIQUE: Multidetector CT imaging of the chest was performed using the standard protocol during bolus administration of intravenous contrast. Multiplanar CT image reconstructions and MIPs were obtained to evaluate the vascular anatomy. CONTRAST:  19mL OMNIPAQUE IOHEXOL 350 MG/ML SOLN COMPARISON:  07/26/2019 FINDINGS: Cardiovascular: Heart size upper limits normal. No pericardial effusion. Satisfactory opacification of pulmonary arteries noted, and there is no evidence of pulmonary emboli. Adequate contrast opacification of the thoracic aorta with no evidence of dissection, aneurysm, or stenosis. There is classic 3-vessel brachiocephalic arch anatomy without proximal stenosis. Moderate calcified plaque in the arch and descending thoracic segment. Mediastinum/Nodes: No mass or adenopathy. Lungs/Pleura: No pleural effusion. No pneumothorax. Some worsening of atelectasis/consolidation posteriorly at both lung bases, and in the dependent aspect of the lingula and lower lobes. Upper Abdomen: No acute findings Musculoskeletal: Bilateral shoulder DJD. Negative for fracture or worrisome bone lesion. Review of the MIP images confirms the above findings. IMPRESSION: 1. Negative for acute PE or thoracic aortic dissection.  2. Some worsening of atelectasis/consolidation posteriorly at both lung bases, and in the dependent aspect of the lingula and lower lobes. Aortic Atherosclerosis (ICD10-I70.0). Electronically Signed   By: Lucrezia Europe M.D.   On: 11/29/2019  14:03    Procedures Procedures (including critical care time)  Medications Ordered in ED Medications  aspirin EC tablet 81 mg (0 mg Oral Hold 11/29/19 2019)  verapamil (CALAN) tablet 120 mg (has no administration in time range)  donepezil (ARICEPT) tablet 10 mg (has no administration in time range)  escitalopram (LEXAPRO) tablet 20 mg (has no administration in time range)  memantine (NAMENDA) tablet 10 mg (has no administration in time range)  mirtazapine (REMERON) tablet 7.5 mg (has no administration in time range)  risperiDONE (RISPERDAL) tablet 1 mg (has no administration in time range)  traZODone (DESYREL) tablet 50 mg (has no administration in time range)  calcium carbonate (TUMS - dosed in mg elemental calcium) chewable tablet 400 mg of elemental calcium (has no administration in time range)  docusate sodium (COLACE) capsule 100 mg (has no administration in time range)  pantoprazole (PROTONIX) EC tablet 40 mg (has no administration in time range)  polyethylene glycol (MIRALAX / GLYCOLAX) packet 17 g (has no administration in time range)  ferrous sulfate tablet 325 mg (has no administration in time range)  melatonin tablet 10 mg (has no administration in time range)  zonisamide (ZONEGRAN) capsule 200 mg (has no administration in time range)  albuterol (VENTOLIN HFA) 108 (90 Base) MCG/ACT inhaler 2 puff (has no administration in time range)  fluticasone (FLONASE) 50 MCG/ACT nasal spray 1 spray (has no administration in time range)  heparin injection 5,000 Units (has no administration in time range)  cefTRIAXone (ROCEPHIN) 2 g in sodium chloride 0.9 % 100 mL IVPB (2 g Intravenous New Bag/Given 11/29/19 2009)  azithromycin (ZITHROMAX) 500 mg in sodium  chloride 0.9 % 250 mL IVPB (has no administration in time range)  acetaminophen (TYLENOL) tablet 650 mg (650 mg Oral Given 11/29/19 2010)    Or  acetaminophen (TYLENOL) suppository 650 mg ( Rectal See Alternative 11/29/19 2010)  ondansetron (ZOFRAN) tablet 4 mg (has no administration in time range)    Or  ondansetron (ZOFRAN) injection 4 mg (has no administration in time range)  ipratropium-albuterol (DUONEB) 0.5-2.5 (3) MG/3ML nebulizer solution 3 mL (3 mLs Nebulization Given 11/29/19 2012)  sodium chloride (PF) 0.9 % injection (  Given by Other 11/29/19 1320)  iohexol (OMNIPAQUE) 350 MG/ML injection 80 mL (80 mLs Intravenous Contrast Given 11/29/19 1300)    ED Course  I have reviewed the triage vital signs and the nursing notes.  Pertinent labs & imaging results that were available during my care of the patient were reviewed by me and considered in my medical decision making (see chart for details).    MDM Rules/Calculators/A&P                          DELYLA SANDEEN is a 74 y.o. female with a past medical history significant for hypertension, GERD, dementia, prior cancer, and prior aortic dissection who presents with chest pain, shortness of breath, hemoptysis, and cough.  Patient reports that she started having chest pain yesterday and it feels like a pressure in her central chest.  She reports she had recent hemoptysis.  She reports no significant nausea, vomiting, constipation, or diarrhea.  She denies any leg pain or leg swelling.  She reports this does not feel like when she had aortic problems in the past.  She reports it is not sharp and not going to her back.  She is alert and oriented.  She is primarily concerned about shortness of breath that does not  seem to be getting better despite the albuterol before.  She denies wheezing.  On exam, chest is nontender.  Lungs had some mild rhonchi with no wheezing.  Abdomen is nontender.  She was placed on 2 L prior to my evaluation for the  shortness of breath and reported tachypnea and dyspnea.  Legs are nontender nonedematous.  Abdomen nontender.  Normal bowel sounds.  EKG showed no STEMI.  Given her significant shortness of breath sensation as well as the chest discomfort with hemoptysis, I do feel need to rule out pulmonary embolism.  We will get CT PE study.  Lower suspicion for dissection given her lack of sharp pain going to her back, elevated blood pressures, and does not feel like when she had pain in the past.   Work-up began to return.  Covid test and flu test negative.  Lactic acid is slightly elevated, she is getting some fluids.  Creatinine is similar to prior and metabolic panel otherwise similar to prior.  No leukocytosis or anemia.  BNP not elevated. Trop negative.   If PE study is reassuring, anticipate discontinue oxygen supplementation and try to ambulate the patient to see if she gets hypoxic.  If she does not have hypoxia and we do not find any concerning findings, anticipate she will likely be discharged home.  2:51 PM Patient's PE study does not show pulmonary ballismus does show some consolidation.  She still has no fevers or chills.  Otherwise her work-up was improving a reassuring.  Her lactic acid initially was 2 but then improved to 0.9.  No leukocytosis.  I went to reassess the patient and she is still short of breath.  When I was speaking with her on the 3 L she is currently on, her oxygen saturations dropped to 84% and she was still short of breath.  Is unclear the exact etiology of her hypoxemia we ruled out PE, her cardiac work-up is reassuring, her Covid test is negative, and her BNP is not elevated however, as she is now on 5 L nasal cannula to maintain oxygen saturation in the low 90s, I do not feel she is safe for discharge home.  Patient will be admitted for hypoxia of unknown origin.  Will discuss with hospitalist if he feels antibiotics are appropriate given the possible consolidation versus  atelectasis on imaging.    Final Clinical Impression(s) / ED Diagnoses Final diagnoses:  SOB (shortness of breath)  Hypoxia  Chest pain, unspecified type     Clinical Impression: 1. SOB (shortness of breath)   2. Hypoxia   3. Chest pain, unspecified type     Disposition: Admit  This note was prepared with assistance of Dragon voice recognition software. Occasional wrong-word or sound-a-like substitutions may have occurred due to the inherent limitations of voice recognition software.     Sol Odor, Gwenyth Allegra, MD 11/29/19 2039

## 2019-11-29 NOTE — ED Triage Notes (Signed)
Pt arrived via EMS from [facility] memory care, per EMS, staff reported pt c/o Sob since last night, was given albuterol inhaler with no relief. Ran out of inhaler, pt still reporting SOB, sent to ED for eval. Pt c/o SOB and headache in triage.

## 2019-11-30 DIAGNOSIS — J189 Pneumonia, unspecified organism: Principal | ICD-10-CM

## 2019-11-30 LAB — CBC
HCT: 47.3 % — ABNORMAL HIGH (ref 36.0–46.0)
Hemoglobin: 14.7 g/dL (ref 12.0–15.0)
MCH: 29.4 pg (ref 26.0–34.0)
MCHC: 31.1 g/dL (ref 30.0–36.0)
MCV: 94.6 fL (ref 80.0–100.0)
Platelets: 258 10*3/uL (ref 150–400)
RBC: 5 MIL/uL (ref 3.87–5.11)
RDW: 14.7 % (ref 11.5–15.5)
WBC: 10.2 10*3/uL (ref 4.0–10.5)
nRBC: 0 % (ref 0.0–0.2)

## 2019-11-30 LAB — COMPREHENSIVE METABOLIC PANEL
ALT: 9 U/L (ref 0–44)
AST: 12 U/L — ABNORMAL LOW (ref 15–41)
Albumin: 3.1 g/dL — ABNORMAL LOW (ref 3.5–5.0)
Alkaline Phosphatase: 53 U/L (ref 38–126)
Anion gap: 11 (ref 5–15)
BUN: 15 mg/dL (ref 8–23)
CO2: 25 mmol/L (ref 22–32)
Calcium: 8.6 mg/dL — ABNORMAL LOW (ref 8.9–10.3)
Chloride: 106 mmol/L (ref 98–111)
Creatinine, Ser: 1.3 mg/dL — ABNORMAL HIGH (ref 0.44–1.00)
GFR, Estimated: 40 mL/min — ABNORMAL LOW (ref >60)
Glucose, Bld: 83 mg/dL (ref 70–99)
Potassium: 3.2 mmol/L — ABNORMAL LOW (ref 3.5–5.1)
Sodium: 142 mmol/L (ref 135–145)
Total Bilirubin: 0.5 mg/dL (ref 0.3–1.2)
Total Protein: 5.4 g/dL — ABNORMAL LOW (ref 6.5–8.1)

## 2019-11-30 LAB — STREP PNEUMONIAE URINARY ANTIGEN: Strep Pneumo Urinary Antigen: NEGATIVE

## 2019-11-30 MED ORDER — IPRATROPIUM-ALBUTEROL 0.5-2.5 (3) MG/3ML IN SOLN
3.0000 mL | Freq: Two times a day (BID) | RESPIRATORY_TRACT | Status: DC
  Administered 2019-11-30: 3 mL via RESPIRATORY_TRACT
  Filled 2019-11-30 (×2): qty 3

## 2019-11-30 MED ORDER — IPRATROPIUM-ALBUTEROL 0.5-2.5 (3) MG/3ML IN SOLN
3.0000 mL | Freq: Three times a day (TID) | RESPIRATORY_TRACT | Status: DC
  Administered 2019-11-30: 3 mL via RESPIRATORY_TRACT
  Filled 2019-11-30: qty 3

## 2019-11-30 NOTE — Progress Notes (Signed)
Patient arrived from ED via stretcher, able to ambulate with assistance to the bed, gait somewhat weak/unsteady, alert to self and situation, did not know date, vss, purewick in place and connected to suction, small open area on helix of right ear, rest of skin dry but intact, completed admission but patient unable to provide some information, bed locked and low, bed alarm on, floor mats ordered from portables, patient instructed on call bell use, will continue to monitor.

## 2019-11-30 NOTE — Plan of Care (Signed)

## 2019-11-30 NOTE — Progress Notes (Signed)
PROGRESS NOTE  Jamie George  DOB: 07-03-1945  PCP: Kristen Loader, FNP OZY:[id number]  DOA: 11/29/2019  LOS: 1 day   Chief Complaint  Patient presents with  . Shortness of Breath   Brief narrative: Jamie George is a 74 y.o. female with PMH of dementia, hypertension, GERD, migraine. Patient presented to the ED from an ALF on 11/29/2019 with complaint of progressively worsening dyspnea for several hours not responding to inhalers.  In the ED, patient was afebrile, oxygen saturation dropped to 80s while on room air and required up to 5 L by nasal cannula. Covid PCR negative. BNP and troponin negative. CT pulmonary angiogram ruled out pulmonary medicine but showed possible bibasilar consolidation.  Patient was admitted under hospitalist service for further evaluation management.  Subjective: Patient was seen and examined this morning. Elderly Caucasian female.  Propped up in bed.  She is on 2 L oxygen by nasal cannula. She constantly repeated that she was having difficulty breathing.  Denies chest pain or chest tightness, chest pressure or dizziness. Chart reviewed. No fever, heart rate in 50s and 60s, blood pressure better now 048 and 889 systolic,  Labs this morning with potassium low at 3.2, creatinine stable.  Assessment/Plan: Acute respiratory with hypoxia Bibasilar pneumonia -Presented with worsening shortness of breath, not responding to inhalers. -Saturation dropped down to 80s on room air in ED. -CT scan of chest with possible bibasilar pneumonia. -Based on her symptoms, hypoxia and CT scan finding, see was started on IV antibiotics for pneumonia. -Continue IV Rocephin and IV azithromycin for now. -Oxygen requirement seems to be improving. Wean down as tolerated. -Continue bronchodilators. -Last echo 6/21 w/ EF 55 - 60%  Anxiety disorder Dementia -lives at a memory care facility -Home meds include risperdal, aricept, namenda, remeron,  lexapro. -Patient however states that she has not been taking these medications because she does not have a drive to go to pharmacy.  ? History of seizure disorder -Patient is on zonisamide 200 mg at bedtime. Continue the same  HTN -Continue to monitor blood pressure on verapamil.  CKD 3b -Remains at baseline.  GERD -Continue protonix  Mobility: Encourage ambulation Code Status:   Code Status: Full Code  Nutritional status: Body mass index is 25.21 kg/m.     Diet Order            Diet regular Room service appropriate? Yes; Fluid consistency: Thin  Diet effective now                 DVT prophylaxis: heparin injection 5,000 Units Start: 11/29/19 2200   Antimicrobials:  IV Rocephin, IV azithromycin Fluid: None Consultants: None Family Communication:  Called and updated patient's daughter Ms. Shannon this afternoon.  Status is: Inpatient  Remains inpatient appropriate because:Ongoing diagnostic testing needed not appropriate for outpatient work up and IV treatments appropriate due to intensity of illness or inability to take PO   Dispo: The patient is from: ALF              Anticipated d/c is to: ALF              Anticipated d/c date is: 2 days              Patient currently is not medically stable to d/c.       Infusions:  . azithromycin 500 mg (11/29/19 2151)  . cefTRIAXone (ROCEPHIN)  IV Stopped (11/29/19 2151)    Scheduled Meds: . aspirin EC  81 mg Oral  Daily  . docusate sodium  100 mg Oral Daily  . donepezil  10 mg Oral QHS  . escitalopram  20 mg Oral Daily  . ferrous sulfate  325 mg Oral BID WC  . fluticasone  1 spray Each Nare Daily  . heparin  5,000 Units Subcutaneous Q8H  . ipratropium-albuterol  3 mL Nebulization BID  . melatonin  10 mg Oral QHS  . memantine  10 mg Oral BID  . mirtazapine  7.5 mg Oral QHS  . pantoprazole  40 mg Oral BID  . polyethylene glycol  17 g Oral Daily  . risperiDONE  1 mg Oral QHS  . traZODone  50 mg Oral QHS   . verapamil  120 mg Oral Daily  . zonisamide  200 mg Oral QHS    Antimicrobials: Anti-infectives (From admission, onward)   Start     Dose/Rate Route Frequency Ordered Stop   11/29/19 1945  cefTRIAXone (ROCEPHIN) 2 g in sodium chloride 0.9 % 100 mL IVPB        2 g 200 mL/hr over 30 Minutes Intravenous Every 24 hours 11/29/19 1936 12/04/19 1944   11/29/19 1945  azithromycin (ZITHROMAX) 500 mg in sodium chloride 0.9 % 250 mL IVPB        500 mg 250 mL/hr over 60 Minutes Intravenous Every 24 hours 11/29/19 1936 12/04/19 1944      PRN meds: acetaminophen **OR** acetaminophen, albuterol, calcium carbonate, ondansetron **OR** ondansetron (ZOFRAN) IV   Objective: Vitals:   11/30/19 0614 11/30/19 0636  BP:  139/75  Pulse:  64  Resp:  16  Temp:  (!) 97.5 F (36.4 C)  SpO2: 90% 93%    Intake/Output Summary (Last 24 hours) at 11/30/2019 0756 Last data filed at 11/30/2019 0600 Gross per 24 hour  Intake 0 ml  Output 50 ml  Net -50 ml   Filed Weights   11/29/19 2304  Weight: 73 kg   Weight change:  Body mass index is 25.21 kg/m.   Physical Exam: General exam: Appears calm and comfortable.  Patient states she is short of breath but does not look to be in physical distress Skin: No rashes, lesions or ulcers. HEENT: Atraumatic, normocephalic, supple neck, no obvious bleeding Lungs: Clear to auscultation bilaterally CVS: Regular rate and rhythm, no murmur GI/Abd soft, nontender, nondistended, bowel sound present CNS: Alert, awake, oriented to place and person, Psychiatry: Looks depressed Extremities: No pedal edema, no calf tenderness  Data Review: I have personally reviewed the laboratory data and studies available.  Recent Labs  Lab 11/29/19 1204 11/29/19 1937 11/30/19 0320  WBC 9.2 9.8 10.2  NEUTROABS 5.9  --   --   HGB 16.4* 15.9* 14.7  HCT 52.2* 49.8* 47.3*  MCV 93.7 92.7 94.6  PLT 283 277 258   Recent Labs  Lab 11/29/19 1204 11/29/19 1937 11/30/19 0320   NA 142  --  142  K 3.6  --  3.2*  CL 109  --  106  CO2 24  --  25  GLUCOSE 93  --  83  BUN 17  --  15  CREATININE 1.29* 1.28* 1.30*  CALCIUM 8.8*  --  8.6*    F/u labs tomorrow.  Signed, Terrilee Croak, MD [facility] 11/30/2019

## 2019-12-01 LAB — BASIC METABOLIC PANEL
Anion gap: 10 (ref 5–15)
BUN: 18 mg/dL (ref 8–23)
CO2: 23 mmol/L (ref 22–32)
Calcium: 8.6 mg/dL — ABNORMAL LOW (ref 8.9–10.3)
Chloride: 108 mmol/L (ref 98–111)
Creatinine, Ser: 1.27 mg/dL — ABNORMAL HIGH (ref 0.44–1.00)
GFR, Estimated: 41 mL/min — ABNORMAL LOW (ref >60)
Glucose, Bld: 99 mg/dL (ref 70–99)
Potassium: 3.9 mmol/L (ref 3.5–5.1)
Sodium: 141 mmol/L (ref 135–145)

## 2019-12-01 LAB — RESPIRATORY PANEL BY RT PCR (FLU A&B, COVID)
Influenza A by PCR: NEGATIVE
Influenza B by PCR: NEGATIVE
SARS Coronavirus 2 by RT PCR: NEGATIVE

## 2019-12-01 MED ORDER — SACCHAROMYCES BOULARDII 250 MG PO CAPS: 250.0000 mg | ORAL_CAPSULE | Freq: Two times a day (BID) | ORAL | 0 refills | Status: AC

## 2019-12-01 MED ORDER — SACCHAROMYCES BOULARDII 250 MG PO CAPS: 250.0000 mg | ORAL_CAPSULE | Freq: Two times a day (BID) | ORAL | 0 refills | Status: DC

## 2019-12-01 MED ORDER — CEFDINIR 300 MG PO CAPS: 300.0000 mg | ORAL_CAPSULE | Freq: Two times a day (BID) | ORAL | 0 refills | Status: DC

## 2019-12-01 MED ORDER — CEFDINIR 300 MG PO CAPS: 300.0000 mg | ORAL_CAPSULE | Freq: Two times a day (BID) | ORAL | 0 refills | Status: AC

## 2019-12-01 MED ORDER — IPRATROPIUM-ALBUTEROL 0.5-2.5 (3) MG/3ML IN SOLN
3.0000 mL | RESPIRATORY_TRACT | Status: DC | PRN
  Administered 2019-12-01: 3 mL via RESPIRATORY_TRACT

## 2019-12-01 NOTE — Evaluation (Signed)
Physical Therapy Evaluation Patient Details Name: Jamie George MRN: [id number] DOB: [birth year] Today's Date: 12/01/2019   History of Present Illness  Pt admitted from memory care facility with dx of PNA.  Pt with hx of meningioma, dementia and back surgery  Clinical Impression  Pt admitted as above and presenting with functional mobility limitations 2* generalized weakness, decreased endurance, ambulatory balance deficits. C/o bil knee pain with WB and dementia related cognitive deficits.  Pt would best be served by return to previous residence in memory care.  This date pt able to ambulate 350' in hall on RA with O2 sats at 91% or higher and max HR 108.    Follow Up Recommendations Other (comment) (Back to memory care)    Equipment Recommendations  None recommended by PT    Recommendations for Other Services       Precautions / Restrictions Precautions Precautions: Fall Restrictions Weight Bearing Restrictions: No      Mobility  Bed Mobility Overal bed mobility: Modified Independent             General bed mobility comments: Pt unassisted  Transfers Overall transfer level: Needs assistance Equipment used: Rolling walker (2 wheeled) Transfers: Sit to/from Stand Sit to Stand: Min guard         General transfer comment: steady assist only  Ambulation/Gait Ambulation/Gait assistance: Min guard;Supervision Gait Distance (Feet): 350 Feet Assistive device: Rolling walker (2 wheeled) Gait Pattern/deviations: Step-through pattern;Decreased step length - right;Decreased step length - left;Shuffle;Trunk flexed Gait velocity: decr   General Gait Details: cues for posture and position from RW; one standing rest break  Stairs            Wheelchair Mobility    Modified Rankin (Stroke Patients Only)       Balance Overall balance assessment: Needs assistance Sitting-balance support: No upper extremity supported;Feet supported Sitting balance-Leahy  Scale: Good     Standing balance support: No upper extremity supported Standing balance-Leahy Scale: Fair                               Pertinent Vitals/Pain Pain Assessment: Faces Faces Pain Scale: Hurts a little bit Pain Location: bilat knees with WB    Home Living Family/patient expects to be discharged to:: Other (Comment)                 Additional Comments: To prior residence in memory care    Prior Function Level of Independence: Needs assistance   Gait / Transfers Assistance Needed: uses rollator     Comments: Pt lives at [place].  Reports independent with ADLs.  Goes to dining hall for meals.  Uses rollator to ambulate. Questionable historian due to hx of dementia.     Hand Dominance   Dominant Hand: Right    Extremity/Trunk Assessment   Upper Extremity Assessment Upper Extremity Assessment: Generalized weakness    Lower Extremity Assessment Lower Extremity Assessment: Generalized weakness    Cervical / Trunk Assessment Cervical / Trunk Assessment: Kyphotic  Communication   Communication: No difficulties  Cognition Arousal/Alertness: Awake/alert Behavior During Therapy: WFL for tasks assessed/performed Overall Cognitive Status: History of cognitive impairments - at baseline                                 General Comments: oriented to person, place and situation but repeats same questions and statements  repeatedly      General Comments      Exercises     Assessment/Plan    PT Assessment Patient needs continued PT services  PT Problem List Decreased strength;Decreased activity tolerance;Decreased balance;Decreased mobility;Decreased cognition;Decreased knowledge of use of DME;Pain       PT Treatment Interventions DME instruction;Gait training;Functional mobility training;Therapeutic activities;Therapeutic exercise;Patient/family education;Balance training    PT Goals (Current goals can be found  in the Care Plan section)  Acute Rehab PT Goals Patient Stated Goal: HOME PT Goal Formulation: With patient Time For Goal Achievement: 12/08/19 Potential to Achieve Goals: Good    Frequency Min 3X/week   Barriers to discharge        Co-evaluation               AM-PAC PT "6 Clicks" Mobility  Outcome Measure Help needed turning from your back to your side while in a flat bed without using bedrails?: None Help needed moving from lying on your back to sitting on the side of a flat bed without using bedrails?: None Help needed moving to and from a bed to a chair (including a wheelchair)?: A Little Help needed standing up from a chair using your arms (e.g., wheelchair or bedside chair)?: A Little Help needed to walk in hospital room?: A Little Help needed climbing 3-5 steps with a railing? : A Little 6 Click Score: 20    End of Session Equipment Utilized During Treatment: Gait belt Activity Tolerance: Patient tolerated treatment well Patient left: in chair;with call bell/phone within reach;with chair alarm set Nurse Communication: Mobility status PT Visit Diagnosis: Difficulty in walking, not elsewhere classified (R26.2);Muscle weakness (generalized) (M62.81)    Time: 3570-1779 PT Time Calculation (min) (ACUTE ONLY): 32 min   Charges:   PT Evaluation $PT Eval Low Complexity: 1 Low PT Treatments $Gait Training: 8-22 mins        [place] Pager [phone] Office [phone]   Icey Tello 12/01/2019, 1:12 PM

## 2019-12-01 NOTE — Plan of Care (Signed)

## 2019-12-01 NOTE — TOC Transition Note (Signed)
Transition of Care [facility]) - CM/SW Discharge Note   Patient Details  Name: Jamie George MRN: [id number] Date of Birth: 02/06/1946  [facility] CM/SW Contact:  Lia Hopping, [place] Phone Number: 12/01/2019, 3:15 PM   Clinical Narrative:    D/C Summary/ FL2 faxed via [facility].  Nurse gave report about new medications. Facility ready to accept the patient.  PTAR arranged to transport the patient.   Final next level of care: Assisted Living Barriers to Discharge: Barriers Resolved   Patient Goals and CMS Choice     Choice offered to / list presented to : NA  Discharge Placement                Patient to be transferred to facility by: [place] Name of family member notified: [name] Daughter   [phone] Patient and family notified of of transfer: 12/01/19  Discharge Plan and Services In-house Referral: Clinical Social Work Discharge Planning Services: CM Consult Post Acute Care Choice: Nursing Home                               Social Determinants of Health (SDOH) Interventions     Readmission Risk Interventions No flowsheet data found.

## 2019-12-01 NOTE — TOC Initial Note (Addendum)
Transition of Care [facility]) - Initial/Assessment Note    Patient Details  Name: Jamie George MRN: [id number] Date of Birth: 15-Oct-1945  Transition of Care [facility]) CM/SW Contact:    Lia Hopping, [place] Phone Number: 12/01/2019, 12:33 PM  Clinical Narrative:   Patient admitted for Dyspnea.  Per PT staff [place], the patient ambulated 358ft. Patient oxygen stats do not require nasal cannula.                 Patient medically stable to transition back to [facility]. CSW reached out to 2x [facility], left a voicemail.  CSW notified the patient daughter [name].  FL2 completed.    Expected Discharge Plan: Assisted Living Barriers to Discharge: Barriers Resolved   Patient Goals and CMS Choice     Choice offered to / list presented to : NA  Expected Discharge Plan and Services Expected Discharge Plan: Assisted Living In-house Referral: Clinical Social Work Discharge Planning Services: CM Consult Post Acute Care Choice: Nursing Home Living arrangements for the past 2 months: [place] Expected Discharge Date: 12/01/19                                    Prior Living Arrangements/Services Living arrangements for the past 2 months: [place] Lives with:: Facility Resident Patient language and need for interpreter reviewed:: No Do you feel safe going back to the place where you live?: Yes      Need for Family Participation in Patient Care: Yes (Comment) Care giver support system in place?: Yes (comment) Current home services: DME Criminal Activity/Legal Involvement Pertinent to Current Situation/Hospitalization: No - Comment as needed  Activities of Daily Living Home Assistive Devices/Equipment: Gilford Rile (specify type) ADL Screening (condition at time of admission) Patient's cognitive ability adequate to safely complete daily activities?: Yes Is the patient deaf or have difficulty hearing?: No Does the patient have  difficulty seeing, even when wearing glasses/contacts?: No Does the patient have difficulty concentrating, remembering, or making decisions?: Yes Patient able to express need for assistance with ADLs?: Yes Does the patient have difficulty dressing or bathing?: No Independently performs ADLs?: Yes (appropriate for developmental age) Does the patient have difficulty walking or climbing stairs?: Yes Weakness of Legs: Both Weakness of Arms/Hands: None  Permission Sought/Granted Permission sought to share information with : Facility Art therapist granted to share information with : Yes, Verbal Permission Granted  Share Information with NAME: [name]  Permission granted to share info w AGENCY: [place] granted to share info w Relationship: Daughter  Permission granted to share info w Contact Information: [phone]  Emotional Assessment Appearance:: Appears stated age Attitude/Demeanor/Rapport: Gracious Affect (typically observed): Accepting Orientation: : Oriented to Self, Oriented to Place, Oriented to Situation Alcohol / Substance Use: Not Applicable Psych Involvement: No (comment)  Admission diagnosis:  Pneumonia [J18.9] SOB (shortness of breath) [R06.02] Hypoxia [R09.02] Chest pain, unspecified type [R07.9] Patient Active Problem List   Diagnosis Date Noted  . Pneumonia 11/29/2019  . Dyspnea 07/30/2019  . Dementia ([place]) 01/02/2018  . Aggression 01/02/2018  . Dissection of abdominal aorta ([place]) 08/13/2017  . Chest pain 08/13/2017  . Hypertension 08/13/2017  . CKD (chronic kidney disease) 08/13/2017  . Anemia 08/13/2017   PCP:  Kristen Loader, FNP Pharmacy:   [facility], [place] [address] [place] [address] Phone: [phone]  Fax: [phone]     Social Determinants of Health (SDOH) Interventions    Readmission Risk Interventions No flowsheet data  found.

## 2019-12-01 NOTE — NC FL2 (Addendum)
[place] LEVEL OF CARE SCREENING TOOL     IDENTIFICATION  Patient Name: Jamie George Birthdate: [birth year] Sex: female Admission Date (Current Location): 11/29/2019  [facility] and [place] Number:  Herbalist and Address:  [facility],  [place] [address], [place]      Provider Number: [id number]  Attending Physician Name and Address:  Terrilee Croak, MD  Relative Name and Phone Number:  [name] Daughter   [phone]    Current Level of Care: Hospital Recommended Level of Care: [place] Prior Approval Number:    Date Approved/Denied:   PASRR Number:    Discharge Plan: Other (Comment)    Current Diagnoses: Patient Active Problem List   Diagnosis Date Noted  . Pneumonia 11/29/2019  . Dyspnea 07/30/2019  . Dementia ([place]) 01/02/2018  . Aggression 01/02/2018  . Dissection of abdominal aorta ([place]) 08/13/2017  . Chest pain 08/13/2017  . Hypertension 08/13/2017  . CKD (chronic kidney disease) 08/13/2017  . Anemia 08/13/2017    Orientation RESPIRATION BLADDER Height & Weight     Self, Situation, Place  Normal Continent Weight: 160 lb 15 oz (73 kg) Height:  5\' 7"  (170.2 cm)  BEHAVIORAL SYMPTOMS/MOOD NEUROLOGICAL BOWEL NUTRITION STATUS      Continent Diet (Regular Diet)  AMBULATORY STATUS COMMUNICATION OF NEEDS Skin   Limited Assist Verbally Normal                       Personal Care Assistance Level of Assistance  Bathing, Feeding, Dressing Bathing Assistance: Limited assistance Feeding assistance: Independent Dressing Assistance: Limited assistance     Functional Limitations Info  Sight, Hearing, Speech Sight Info: Adequate Hearing Info: Adequate Speech Info: Adequate    SPECIAL CARE FACTORS FREQUENCY  PT (By licensed PT), OT (By licensed OT)                    Contractures Contractures Info: Not present    Additional Factors Info  Code Status, Allergies, Psychotropic,  Insulin Sliding Scale Code Status Info: Fullcode Allergies Info: Allergies: No Known Allergies Psychotropic Info: Aricept, Lexapro, Namenda, Remeron, Desyrel         Current Medications (12/01/2019):  This is the current hospital active medication list  Discharge Medications: Please see discharge summary for a list of discharge medications.  Allergies as of 12/01/2019   No Known Allergies      Medication List     TAKE these medications    acetaminophen 325 MG tablet Commonly known as: TYLENOL Take 650 mg by mouth in the morning and at bedtime.   albuterol 108 (90 Base) MCG/ACT inhaler Commonly known as: VENTOLIN HFA Inhale 2 puffs into the lungs every 6 (six) hours as needed for wheezing or shortness of breath.   aspirin 81 MG EC tablet Take 1 tablet (81 mg total) by mouth daily. With food   atorvastatin 40 MG tablet Commonly known as: LIPITOR Take 40 mg by mouth as directed. Take on Thurs & Sun   cefdinir 300 MG capsule Commonly known as: OMNICEF Take 1 capsule (300 mg total) by mouth 2 (two) times daily for 3 days.   Chloraseptic 6-10 MG lozenge Generic drug: benzocaine-menthol Take 1 lozenge by mouth every 4 (four) hours as needed for sore throat.   docusate sodium 100 MG capsule Commonly known as: COLACE Take 100 mg by mouth daily.   donepezil 10 MG tablet Commonly known as: ARICEPT Take 10 mg  by mouth at bedtime.   escitalopram 20 MG tablet Commonly known as: LEXAPRO Take 20 mg by mouth daily.   ferrous sulfate 325 (65 FE) MG tablet Take 325 mg by mouth 2 (two) times daily with a meal.   fluticasone 50 MCG/ACT nasal spray Commonly known as: FLONASE Place 1 spray into both nostrils daily.   melatonin 5 MG Tabs Take 10 mg by mouth at bedtime.   memantine 10 MG tablet Commonly known as: NAMENDA Take 1 tablet (10 mg total) by mouth 2 (two) times daily.   mirtazapine 7.5 MG tablet Commonly known as: REMERON Take 7.5 mg by  mouth at bedtime.   pantoprazole 40 MG tablet Commonly known as: PROTONIX Take 40 mg by mouth 2 (two) times daily.   polyethylene glycol 17 g packet Commonly known as: MIRALAX / GLYCOLAX Take 17 g by mouth daily.   risperiDONE 0.5 MG tablet Commonly known as: RISPERDAL Take 1 mg by mouth at bedtime.   saccharomyces boulardii 250 MG capsule Commonly known as: FLORASTOR Take 1 capsule (250 mg total) by mouth 2 (two) times daily for 5 days.   traZODone 50 MG tablet Commonly known as: DESYREL Take 50 mg by mouth at bedtime.   Tums 500 MG chewable tablet Generic drug: calcium carbonate Chew 2 tablets by mouth every 12 (twelve) hours as needed for indigestion or heartburn.   verapamil 120 MG tablet Commonly known as: CALAN Take 120 mg by mouth daily.   zonisamide 100 MG capsule Commonly known as: ZONEGRAN Take 200 mg by mouth at bedtime.      Relevant Imaging Results:  Relevant Lab Results:   Additional Information [phone]  Lia Hopping, LCSW

## 2019-12-01 NOTE — Progress Notes (Deleted)
CSW following for patient discharge needs. SNF vs. Residential hospice.

## 2019-12-01 NOTE — Discharge Summary (Signed)
Physician Discharge Summary  Jamie George UXN:[id number] DOB: Jul 03, 1945 DOA: 11/29/2019  PCP: Kristen Loader, FNP  Admit date: 11/29/2019 Discharge date: 12/01/2019  Admitted From: Assisted living facility Discharge disposition: Back to assisted living facility   Code Status: Full Code  Diet Recommendation: Cardiac diet  Discharge Diagnosis:   Active Problems:   Pneumonia   History of Present Illness / Brief narrative:  Jamie George is a 74 y.o. female with PMH of dementia, hypertension, GERD, migraine. Patient presented to the ED from an ALF on 11/29/2019 with complaint of progressively worsening dyspnea for several hours not responding to inhalers.   In the ED, patient was afebrile, oxygen saturation dropped to 80s while on room air and required up to 5 L by nasal cannula. Covid PCR negative. BNP and troponin negative. CT pulmonary angiogram ruled out pulmonary medicine but showed possible bibasilar consolidation.   Patient was admitted under hospitalist service for further evaluation management.  Subjective:  Seen and examined this morning.  Lying on bed.  On low-flow oxygen.  Not in distress.  Complains of mild headache.  Hospital Course:  Acute respiratory with hypoxia Bibasilar pneumonia -Presented with worsening shortness of breath, not responding to inhalers. -Saturation dropped down to 80s on room air in ED. -CT scan of chest with bibasilar pneumonia. -Based on her symptoms, hypoxia and CT scan finding, see was started on IV antibiotics for pneumonia. -Clinically improving.  Patient ambulated with PT.  She did not require supplemental oxygen to maintain O2 sat more than 90%.   -Discharged on 3 more days of oral Omnicef with probiotics.   -Continue bronchodilators. -Last echo 6/21 w/ EF 55 - 60%   Anxiety disorder Dementia -lives at a memory care facility -Home meds include risperdal, aricept, namenda, remeron, lexapro. -There was a concern about  noncompliance to medications.  Patient's daughter stated to me that ALF staff make sure she gets her medications.  Continue oral pills.   ? History of seizure disorder -Patient is on zonisamide 200 mg at bedtime. Continue the same   HTN -Controlled blood pressure on verapamil.   CKD 3b -Remains at baseline.  Hypokalemia -Improved with replacement. Recent Labs  Lab 11/29/19 1204 11/30/19 0320 12/01/19 0854  K 3.6 3.2* 3.9    GERD -Continue protonix  Stable for discharge back to facility today.  Wound care:    Discharge Exam:   Vitals:   11/30/19 1304 11/30/19 1918 11/30/19 2204 12/01/19 0642  BP: 125/61  (!) 105/50 (!) 154/64  Pulse: 70  66 64  Resp: 20  18 18   Temp: 98.3 F (36.8 C)  97.8 F (36.6 C) 98.1 F (36.7 C)  TempSrc: Oral  Oral Oral  SpO2: 94% 95% 96% 97%  Weight:      Height:        Body mass index is 25.21 kg/m.  General exam: Appears calm and comfortable.  Not in physical distress Skin: No rashes, lesions or ulcers. HEENT: Atraumatic, normocephalic, supple neck, no obvious bleeding Lungs: Clear to auscultation bilaterally.  Shallow respiratory effort. CVS: Regular rate and rhythm, no murmur GI/Abd soft, nontender, nondistended, bowel sound present CNS: Alert, awake, knows he is in the hospital.  Mental status at baseline. Psychiatry: Depressed look Extremities: No pedal edema, no calf tenderness  Follow ups:   Discharge Instructions     Diet general   Complete by: As directed    Increase activity slowly   Complete by: As directed  Follow-up Information     Kristen Loader, FNP Follow up.   Specialty: Family Medicine Contact information: [place] [address] [phone]         Buford Dresser, MD .   Specialty: Cardiology Contact information: [address] [place] [address] [phone]                 Recommendations for Outpatient Follow-Up:   1. Follow-up  with PCP as an outpatient  Discharge Instructions:  Follow with Primary MD Kristen Loader, FNP in 7 days   Get CBC/BMP checked in next visit within 1 week by PCP or SNF MD ( we routinely change or add medications that can affect your baseline labs and fluid status, therefore we recommend that you get the mentioned basic workup next visit with your PCP, your PCP may decide not to get them or add new tests based on their clinical decision)  On your next visit with your PCP, please Get Medicines reviewed and adjusted.  Please request your PCP  to go over all Hospital Tests and Procedure/Radiological results at the follow up, please get all Hospital records sent to your Prim MD by signing hospital release before you go home.  Activity: As tolerated with Full fall precautions use walker/cane & assistance as needed  For Heart failure patients - Check your Weight same time everyday, if you gain over 2 pounds, or you develop in leg swelling, experience more shortness of breath or chest pain, call your Primary MD immediately. Follow Cardiac Low Salt Diet and 1.5 lit/day fluid restriction.  If you have smoked or chewed Tobacco in the last 2 yrs please stop smoking, stop any regular Alcohol  and or any Recreational drug use.  If you experience worsening of your admission symptoms, develop shortness of breath, life threatening emergency, suicidal or homicidal thoughts you must seek medical attention immediately by calling 911 or calling your MD immediately  if symptoms less severe.  You Must read complete instructions/literature along with all the possible adverse reactions/side effects for all the Medicines you take and that have been prescribed to you. Take any new Medicines after you have completely understood and accpet all the possible adverse reactions/side effects.   Do not drive, operate heavy machinery, perform activities at heights, swimming or participation in water activities or provide baby  sitting services if your were admitted for syncope or siezures until you have seen by Primary MD or a Neurologist and advised to do so again.  Do not drive when taking Pain medications.  Do not take more than prescribed Pain, Sleep and Anxiety Medications  Wear Seat belts while driving.   Please note You were cared for by a hospitalist during your hospital stay. If you have any questions about your discharge medications or the care you received while you were in the hospital after you are discharged, you can call the unit and asked to speak with the hospitalist on call if the hospitalist that took care of you is not available. Once you are discharged, your primary care physician will handle any further medical issues. Please note that NO REFILLS for any discharge medications will be authorized once you are discharged, as it is imperative that you return to your primary care physician (or establish a relationship with a primary care physician if you do not have one) for your aftercare needs so that they can reassess your need for medications and monitor your lab values.  Allergies as of 12/01/2019   No Known Allergies      Medication List     TAKE these medications    acetaminophen 325 MG tablet Commonly known as: TYLENOL Take 650 mg by mouth in the morning and at bedtime.   albuterol 108 (90 Base) MCG/ACT inhaler Commonly known as: VENTOLIN HFA Inhale 2 puffs into the lungs every 6 (six) hours as needed for wheezing or shortness of breath.   aspirin 81 MG EC tablet Take 1 tablet (81 mg total) by mouth daily. With food   atorvastatin 40 MG tablet Commonly known as: LIPITOR Take 40 mg by mouth as directed. Take on Thurs & Sun   cefdinir 300 MG capsule Commonly known as: OMNICEF Take 1 capsule (300 mg total) by mouth 2 (two) times daily for 3 days.   Chloraseptic 6-10 MG lozenge Generic drug: benzocaine-menthol Take 1 lozenge by mouth every 4 (four) hours as needed for sore  throat.   docusate sodium 100 MG capsule Commonly known as: COLACE Take 100 mg by mouth daily.   donepezil 10 MG tablet Commonly known as: ARICEPT Take 10 mg by mouth at bedtime.   escitalopram 20 MG tablet Commonly known as: LEXAPRO Take 20 mg by mouth daily.   ferrous sulfate 325 (65 FE) MG tablet Take 325 mg by mouth 2 (two) times daily with a meal.   fluticasone 50 MCG/ACT nasal spray Commonly known as: FLONASE Place 1 spray into both nostrils daily.   melatonin 5 MG Tabs Take 10 mg by mouth at bedtime.   memantine 10 MG tablet Commonly known as: NAMENDA Take 1 tablet (10 mg total) by mouth 2 (two) times daily.   mirtazapine 7.5 MG tablet Commonly known as: REMERON Take 7.5 mg by mouth at bedtime.   pantoprazole 40 MG tablet Commonly known as: PROTONIX Take 40 mg by mouth 2 (two) times daily.   polyethylene glycol 17 g packet Commonly known as: MIRALAX / GLYCOLAX Take 17 g by mouth daily.   risperiDONE 0.5 MG tablet Commonly known as: RISPERDAL Take 1 mg by mouth at bedtime.   saccharomyces boulardii 250 MG capsule Commonly known as: FLORASTOR Take 1 capsule (250 mg total) by mouth 2 (two) times daily for 5 days.   traZODone 50 MG tablet Commonly known as: DESYREL Take 50 mg by mouth at bedtime.   Tums 500 MG chewable tablet Generic drug: calcium carbonate Chew 2 tablets by mouth every 12 (twelve) hours as needed for indigestion or heartburn.   verapamil 120 MG tablet Commonly known as: CALAN Take 120 mg by mouth daily.   zonisamide 100 MG capsule Commonly known as: ZONEGRAN Take 200 mg by mouth at bedtime.        Time coordinating discharge: 35 minutes  The results of significant diagnostics from this hospitalization (including imaging, microbiology, ancillary and laboratory) are listed below for reference.    Procedures and Diagnostic Studies:   CT Angio Chest PE W and/or Wo Contrast  Result Date: 11/29/2019 CLINICAL DATA:  Chest pain,  shortness of breath, hemoptysis, high prob PE EXAM: CT ANGIOGRAPHY CHEST WITH CONTRAST TECHNIQUE: Multidetector CT imaging of the chest was performed using the standard protocol during bolus administration of intravenous contrast. Multiplanar CT image reconstructions and MIPs were obtained to evaluate the vascular anatomy. CONTRAST:  1mL OMNIPAQUE IOHEXOL 350 MG/ML SOLN COMPARISON:  07/26/2019 FINDINGS: Cardiovascular: Heart size upper limits normal. No pericardial effusion. Satisfactory opacification of pulmonary arteries noted, and there is no evidence of  pulmonary emboli. Adequate contrast opacification of the thoracic aorta with no evidence of dissection, aneurysm, or stenosis. There is classic 3-vessel brachiocephalic arch anatomy without proximal stenosis. Moderate calcified plaque in the arch and descending thoracic segment. Mediastinum/Nodes: No mass or adenopathy. Lungs/Pleura: No pleural effusion. No pneumothorax. Some worsening of atelectasis/consolidation posteriorly at both lung bases, and in the dependent aspect of the lingula and lower lobes. Upper Abdomen: No acute findings Musculoskeletal: Bilateral shoulder DJD. Negative for fracture or worrisome bone lesion. Review of the MIP images confirms the above findings. IMPRESSION: 1. Negative for acute PE or thoracic aortic dissection. 2. Some worsening of atelectasis/consolidation posteriorly at both lung bases, and in the dependent aspect of the lingula and lower lobes. Aortic Atherosclerosis (ICD10-I70.0). Electronically Signed   By: Lucrezia Europe M.D.   On: 11/29/2019 14:03     Labs:   Basic Metabolic Panel: Recent Labs  Lab 11/29/19 1204 11/29/19 1204 11/29/19 1937 11/30/19 0320 12/01/19 0854  NA 142  --   --  142 141  K 3.6   < >  --  3.2* 3.9  CL 109  --   --  106 108  CO2 24  --   --  25 23  GLUCOSE 93  --   --  83 99  BUN 17  --   --  15 18  CREATININE 1.29*  --  1.28* 1.30* 1.27*  CALCIUM 8.8*  --   --  8.6* 8.6*   < > =  values in this interval not displayed.   GFR Estimated Creatinine Clearance: 37.2 mL/min (A) (by C-G formula based on SCr of 1.27 mg/dL (H)). Liver Function Tests: Recent Labs  Lab 11/29/19 1204 11/30/19 0320  AST 15 12*  ALT 10 9  ALKPHOS 55 53  BILITOT 0.3 0.5  PROT 6.3* 5.4*  ALBUMIN 3.5 3.1*   Recent Labs  Lab 11/29/19 1204  LIPASE 39   No results for input(s): AMMONIA in the last 168 hours. Coagulation profile No results for input(s): INR, PROTIME in the last 168 hours.  CBC: Recent Labs  Lab 11/29/19 1204 11/29/19 1937 11/30/19 0320  WBC 9.2 9.8 10.2  NEUTROABS 5.9  --   --   HGB 16.4* 15.9* 14.7  HCT 52.2* 49.8* 47.3*  MCV 93.7 92.7 94.6  PLT 283 277 258   Cardiac Enzymes: No results for input(s): CKTOTAL, CKMB, CKMBINDEX, TROPONINI in the last 168 hours. BNP: Invalid input(s): POCBNP CBG: No results for input(s): GLUCAP in the last 168 hours. D-Dimer No results for input(s): DDIMER in the last 72 hours. Hgb A1c No results for input(s): HGBA1C in the last 72 hours. Lipid Profile Recent Labs    11/29/19 1518  TRIG 183*   Thyroid function studies No results for input(s): TSH, T4TOTAL, T3FREE, THYROIDAB in the last 72 hours.  Invalid input(s): FREET3 Anemia work up Recent Labs    11/29/19 1518  FERRITIN 231   Microbiology Recent Results (from the past 240 hour(s))  Respiratory Panel by RT PCR (Flu A&B, Covid) - Nasopharyngeal Swab     Status: None   Collection Time: 11/29/19 12:04 PM   Specimen: Nasopharyngeal Swab  Result Value Ref Range Status   SARS Coronavirus 2 by RT PCR NEGATIVE NEGATIVE Final    Comment: (NOTE) SARS-CoV-2 target nucleic acids are NOT DETECTED.  The SARS-CoV-2 RNA is generally detectable in upper respiratoy specimens during the acute phase of infection. The lowest concentration of SARS-CoV-2 viral copies this assay can detect is  131 copies/mL. A negative result does not preclude SARS-Cov-2 infection and should  not be used as the sole basis for treatment or other patient management decisions. A negative result may occur with  improper specimen collection/handling, submission of specimen other than nasopharyngeal swab, presence of viral mutation(s) within the areas targeted by this assay, and inadequate number of viral copies (<131 copies/mL). A negative result must be combined with clinical observations, patient history, and epidemiological information. The expected result is Negative.  Fact Sheet for Patients:  PinkCheek.be  Fact Sheet for Healthcare Providers:  GravelBags.it  This test is no t yet approved or cleared by the [place] FDA and  has been authorized for detection and/or diagnosis of SARS-CoV-2 by FDA under an Emergency Use Authorization (EUA). This EUA will remain  in effect (meaning this test can be used) for the duration of the COVID-19 declaration under Section 564(b)(1) of the Act, 21 U.S.C. section 360bbb-3(b)(1), unless the authorization is terminated or revoked sooner.     Influenza A by PCR NEGATIVE NEGATIVE Final   Influenza B by PCR NEGATIVE NEGATIVE Final    Comment: (NOTE) The Xpert Xpress SARS-CoV-2/FLU/RSV assay is intended as an aid in  the diagnosis of influenza from Nasopharyngeal swab specimens and  should not be used as a sole basis for treatment. Nasal washings and  aspirates are unacceptable for Xpert Xpress SARS-CoV-2/FLU/RSV  testing.  Fact Sheet for Patients: PinkCheek.be  Fact Sheet for Healthcare Providers: GravelBags.it  This test is not yet approved or cleared by the [place] FDA and  has been authorized for detection and/or diagnosis of SARS-CoV-2 by  FDA under an Emergency Use Authorization (EUA). This EUA will remain  in effect (meaning this test can be used) for the duration of the  Covid-19 declaration under  Section 564(b)(1) of the Act, 21  U.S.C. section 360bbb-3(b)(1), unless the authorization is  terminated or revoked. Performed at [facility], [place] [address] [place], [place] [address]   Blood Culture (routine x 2)     Status: None (Preliminary result)   Collection Time: 11/29/19  3:18 PM   Specimen: BLOOD  Result Value Ref Range Status   Specimen Description   Final    BLOOD LEFT ANTECUBITAL Performed at [facility] [address], [place], [place] [address]    Special Requests   Final    BOTTLES DRAWN AEROBIC AND ANAEROBIC Blood Culture adequate volume Performed at [facility] [address] [place], [place] [address]    Culture   Final    NO GROWTH 2 DAYS Performed at [facility] [address] [place], [place] [address]    Report Status PENDING  Incomplete  Blood Culture (routine x 2)     Status: None (Preliminary result)   Collection Time: 11/29/19  3:23 PM   Specimen: BLOOD  Result Value Ref Range Status   Specimen Description   Final    BLOOD RIGHT ANTECUBITAL Performed at [facility] [address] [place], [place] [address]    Special Requests   Final    BOTTLES DRAWN AEROBIC AND ANAEROBIC Blood Culture results may not be optimal due to an inadequate volume of blood received in culture bottles Performed at [facility] [address] [place], [place] [address]    Culture   Final    NO GROWTH 2 DAYS Performed at [facility] [address], [place], [place] [address]  Report Status PENDING  Incomplete     Signed: Estevon Fluke  [facility] 12/01/2019, 11:10 AM

## 2019-12-01 NOTE — Progress Notes (Signed)
Patient was given discharge instructions, and all questions were answered.  Patient was taken to [facility] by [facility].

## 2019-12-02 LAB — LEGIONELLA PNEUMOPHILA SEROGP 1 UR AG: L. pneumophila Serogp 1 Ur Ag: NEGATIVE

## 2019-12-04 LAB — CULTURE, BLOOD (ROUTINE X 2)
Culture: NO GROWTH
Culture: NO GROWTH
Special Requests: ADEQUATE

## 2020-01-27 DIAGNOSIS — R2681 Unsteadiness on feet: Secondary | ICD-10-CM | POA: Diagnosis not present

## 2020-01-27 DIAGNOSIS — R296 Repeated falls: Secondary | ICD-10-CM | POA: Diagnosis not present

## 2020-01-28 DIAGNOSIS — R489 Unspecified symbolic dysfunctions: Secondary | ICD-10-CM | POA: Diagnosis not present

## 2020-01-28 DIAGNOSIS — R296 Repeated falls: Secondary | ICD-10-CM | POA: Diagnosis not present

## 2020-01-28 DIAGNOSIS — R2681 Unsteadiness on feet: Secondary | ICD-10-CM | POA: Diagnosis not present

## 2020-01-28 DIAGNOSIS — R41841 Cognitive communication deficit: Secondary | ICD-10-CM | POA: Diagnosis not present

## 2020-01-28 DIAGNOSIS — M6281 Muscle weakness (generalized): Secondary | ICD-10-CM | POA: Diagnosis not present

## 2020-01-29 DIAGNOSIS — R489 Unspecified symbolic dysfunctions: Secondary | ICD-10-CM | POA: Diagnosis not present

## 2020-01-29 DIAGNOSIS — R41841 Cognitive communication deficit: Secondary | ICD-10-CM | POA: Diagnosis not present

## 2020-01-29 DIAGNOSIS — M6281 Muscle weakness (generalized): Secondary | ICD-10-CM | POA: Diagnosis not present

## 2020-01-29 DIAGNOSIS — R296 Repeated falls: Secondary | ICD-10-CM | POA: Diagnosis not present

## 2020-01-29 DIAGNOSIS — R2681 Unsteadiness on feet: Secondary | ICD-10-CM | POA: Diagnosis not present

## 2020-01-30 DIAGNOSIS — M6281 Muscle weakness (generalized): Secondary | ICD-10-CM | POA: Diagnosis not present

## 2020-01-30 DIAGNOSIS — R296 Repeated falls: Secondary | ICD-10-CM | POA: Diagnosis not present

## 2020-01-30 DIAGNOSIS — R489 Unspecified symbolic dysfunctions: Secondary | ICD-10-CM | POA: Diagnosis not present

## 2020-01-30 DIAGNOSIS — R41841 Cognitive communication deficit: Secondary | ICD-10-CM | POA: Diagnosis not present

## 2020-01-30 DIAGNOSIS — R2681 Unsteadiness on feet: Secondary | ICD-10-CM | POA: Diagnosis not present

## 2020-02-02 DIAGNOSIS — R489 Unspecified symbolic dysfunctions: Secondary | ICD-10-CM | POA: Diagnosis not present

## 2020-02-02 DIAGNOSIS — R2681 Unsteadiness on feet: Secondary | ICD-10-CM | POA: Diagnosis not present

## 2020-02-02 DIAGNOSIS — R296 Repeated falls: Secondary | ICD-10-CM | POA: Diagnosis not present

## 2020-02-02 DIAGNOSIS — R41841 Cognitive communication deficit: Secondary | ICD-10-CM | POA: Diagnosis not present

## 2020-02-02 DIAGNOSIS — M6281 Muscle weakness (generalized): Secondary | ICD-10-CM | POA: Diagnosis not present

## 2020-02-03 DIAGNOSIS — R296 Repeated falls: Secondary | ICD-10-CM | POA: Diagnosis not present

## 2020-02-03 DIAGNOSIS — R489 Unspecified symbolic dysfunctions: Secondary | ICD-10-CM | POA: Diagnosis not present

## 2020-02-03 DIAGNOSIS — R2681 Unsteadiness on feet: Secondary | ICD-10-CM | POA: Diagnosis not present

## 2020-02-03 DIAGNOSIS — M6281 Muscle weakness (generalized): Secondary | ICD-10-CM | POA: Diagnosis not present

## 2020-02-03 DIAGNOSIS — R41841 Cognitive communication deficit: Secondary | ICD-10-CM | POA: Diagnosis not present

## 2020-02-04 DIAGNOSIS — R41841 Cognitive communication deficit: Secondary | ICD-10-CM | POA: Diagnosis not present

## 2020-02-04 DIAGNOSIS — M6281 Muscle weakness (generalized): Secondary | ICD-10-CM | POA: Diagnosis not present

## 2020-02-04 DIAGNOSIS — R2681 Unsteadiness on feet: Secondary | ICD-10-CM | POA: Diagnosis not present

## 2020-02-04 DIAGNOSIS — R489 Unspecified symbolic dysfunctions: Secondary | ICD-10-CM | POA: Diagnosis not present

## 2020-02-04 DIAGNOSIS — R296 Repeated falls: Secondary | ICD-10-CM | POA: Diagnosis not present

## 2020-02-05 DIAGNOSIS — M6281 Muscle weakness (generalized): Secondary | ICD-10-CM | POA: Diagnosis not present

## 2020-02-05 DIAGNOSIS — R41841 Cognitive communication deficit: Secondary | ICD-10-CM | POA: Diagnosis not present

## 2020-02-05 DIAGNOSIS — R2681 Unsteadiness on feet: Secondary | ICD-10-CM | POA: Diagnosis not present

## 2020-02-05 DIAGNOSIS — R296 Repeated falls: Secondary | ICD-10-CM | POA: Diagnosis not present

## 2020-02-05 DIAGNOSIS — R489 Unspecified symbolic dysfunctions: Secondary | ICD-10-CM | POA: Diagnosis not present

## 2020-02-06 DIAGNOSIS — M6281 Muscle weakness (generalized): Secondary | ICD-10-CM | POA: Diagnosis not present

## 2020-02-06 DIAGNOSIS — R2681 Unsteadiness on feet: Secondary | ICD-10-CM | POA: Diagnosis not present

## 2020-02-06 DIAGNOSIS — R489 Unspecified symbolic dysfunctions: Secondary | ICD-10-CM | POA: Diagnosis not present

## 2020-02-06 DIAGNOSIS — R41841 Cognitive communication deficit: Secondary | ICD-10-CM | POA: Diagnosis not present

## 2020-02-06 DIAGNOSIS — R296 Repeated falls: Secondary | ICD-10-CM | POA: Diagnosis not present

## 2020-02-09 DIAGNOSIS — R489 Unspecified symbolic dysfunctions: Secondary | ICD-10-CM | POA: Diagnosis not present

## 2020-02-09 DIAGNOSIS — R41841 Cognitive communication deficit: Secondary | ICD-10-CM | POA: Diagnosis not present

## 2020-02-09 DIAGNOSIS — R296 Repeated falls: Secondary | ICD-10-CM | POA: Diagnosis not present

## 2020-02-09 DIAGNOSIS — M6281 Muscle weakness (generalized): Secondary | ICD-10-CM | POA: Diagnosis not present

## 2020-02-09 DIAGNOSIS — R2681 Unsteadiness on feet: Secondary | ICD-10-CM | POA: Diagnosis not present

## 2020-02-11 DIAGNOSIS — R41841 Cognitive communication deficit: Secondary | ICD-10-CM | POA: Diagnosis not present

## 2020-02-11 DIAGNOSIS — R489 Unspecified symbolic dysfunctions: Secondary | ICD-10-CM | POA: Diagnosis not present

## 2020-02-11 DIAGNOSIS — M6281 Muscle weakness (generalized): Secondary | ICD-10-CM | POA: Diagnosis not present

## 2020-02-11 DIAGNOSIS — R2681 Unsteadiness on feet: Secondary | ICD-10-CM | POA: Diagnosis not present

## 2020-02-11 DIAGNOSIS — R296 Repeated falls: Secondary | ICD-10-CM | POA: Diagnosis not present

## 2020-02-12 DIAGNOSIS — R41841 Cognitive communication deficit: Secondary | ICD-10-CM | POA: Diagnosis not present

## 2020-02-12 DIAGNOSIS — R296 Repeated falls: Secondary | ICD-10-CM | POA: Diagnosis not present

## 2020-02-12 DIAGNOSIS — M6281 Muscle weakness (generalized): Secondary | ICD-10-CM | POA: Diagnosis not present

## 2020-02-12 DIAGNOSIS — R489 Unspecified symbolic dysfunctions: Secondary | ICD-10-CM | POA: Diagnosis not present

## 2020-02-12 DIAGNOSIS — R2681 Unsteadiness on feet: Secondary | ICD-10-CM | POA: Diagnosis not present

## 2020-02-13 DIAGNOSIS — M6281 Muscle weakness (generalized): Secondary | ICD-10-CM | POA: Diagnosis not present

## 2020-02-13 DIAGNOSIS — R41841 Cognitive communication deficit: Secondary | ICD-10-CM | POA: Diagnosis not present

## 2020-02-13 DIAGNOSIS — R296 Repeated falls: Secondary | ICD-10-CM | POA: Diagnosis not present

## 2020-02-13 DIAGNOSIS — R2681 Unsteadiness on feet: Secondary | ICD-10-CM | POA: Diagnosis not present

## 2020-02-13 DIAGNOSIS — R489 Unspecified symbolic dysfunctions: Secondary | ICD-10-CM | POA: Diagnosis not present

## 2020-02-16 DIAGNOSIS — M6281 Muscle weakness (generalized): Secondary | ICD-10-CM | POA: Diagnosis not present

## 2020-02-16 DIAGNOSIS — R489 Unspecified symbolic dysfunctions: Secondary | ICD-10-CM | POA: Diagnosis not present

## 2020-02-16 DIAGNOSIS — R41841 Cognitive communication deficit: Secondary | ICD-10-CM | POA: Diagnosis not present

## 2020-02-16 DIAGNOSIS — R2681 Unsteadiness on feet: Secondary | ICD-10-CM | POA: Diagnosis not present

## 2020-02-16 DIAGNOSIS — R296 Repeated falls: Secondary | ICD-10-CM | POA: Diagnosis not present

## 2020-02-18 DIAGNOSIS — R489 Unspecified symbolic dysfunctions: Secondary | ICD-10-CM | POA: Diagnosis not present

## 2020-02-18 DIAGNOSIS — R2681 Unsteadiness on feet: Secondary | ICD-10-CM | POA: Diagnosis not present

## 2020-02-18 DIAGNOSIS — R296 Repeated falls: Secondary | ICD-10-CM | POA: Diagnosis not present

## 2020-02-18 DIAGNOSIS — R41841 Cognitive communication deficit: Secondary | ICD-10-CM | POA: Diagnosis not present

## 2020-02-18 DIAGNOSIS — M6281 Muscle weakness (generalized): Secondary | ICD-10-CM | POA: Diagnosis not present

## 2020-02-19 DIAGNOSIS — R2681 Unsteadiness on feet: Secondary | ICD-10-CM | POA: Diagnosis not present

## 2020-02-19 DIAGNOSIS — R41841 Cognitive communication deficit: Secondary | ICD-10-CM | POA: Diagnosis not present

## 2020-02-19 DIAGNOSIS — R489 Unspecified symbolic dysfunctions: Secondary | ICD-10-CM | POA: Diagnosis not present

## 2020-02-19 DIAGNOSIS — M6281 Muscle weakness (generalized): Secondary | ICD-10-CM | POA: Diagnosis not present

## 2020-02-19 DIAGNOSIS — R296 Repeated falls: Secondary | ICD-10-CM | POA: Diagnosis not present

## 2020-02-20 DIAGNOSIS — R2681 Unsteadiness on feet: Secondary | ICD-10-CM | POA: Diagnosis not present

## 2020-02-20 DIAGNOSIS — R489 Unspecified symbolic dysfunctions: Secondary | ICD-10-CM | POA: Diagnosis not present

## 2020-02-20 DIAGNOSIS — M6281 Muscle weakness (generalized): Secondary | ICD-10-CM | POA: Diagnosis not present

## 2020-02-20 DIAGNOSIS — R41841 Cognitive communication deficit: Secondary | ICD-10-CM | POA: Diagnosis not present

## 2020-02-20 DIAGNOSIS — R296 Repeated falls: Secondary | ICD-10-CM | POA: Diagnosis not present

## 2020-02-23 DIAGNOSIS — R41841 Cognitive communication deficit: Secondary | ICD-10-CM | POA: Diagnosis not present

## 2020-02-23 DIAGNOSIS — R2681 Unsteadiness on feet: Secondary | ICD-10-CM | POA: Diagnosis not present

## 2020-02-23 DIAGNOSIS — R489 Unspecified symbolic dysfunctions: Secondary | ICD-10-CM | POA: Diagnosis not present

## 2020-02-23 DIAGNOSIS — R296 Repeated falls: Secondary | ICD-10-CM | POA: Diagnosis not present

## 2020-02-23 DIAGNOSIS — M6281 Muscle weakness (generalized): Secondary | ICD-10-CM | POA: Diagnosis not present

## 2020-02-25 DIAGNOSIS — R41841 Cognitive communication deficit: Secondary | ICD-10-CM | POA: Diagnosis not present

## 2020-02-25 DIAGNOSIS — R2681 Unsteadiness on feet: Secondary | ICD-10-CM | POA: Diagnosis not present

## 2020-02-25 DIAGNOSIS — M6281 Muscle weakness (generalized): Secondary | ICD-10-CM | POA: Diagnosis not present

## 2020-02-25 DIAGNOSIS — R489 Unspecified symbolic dysfunctions: Secondary | ICD-10-CM | POA: Diagnosis not present

## 2020-02-25 DIAGNOSIS — R296 Repeated falls: Secondary | ICD-10-CM | POA: Diagnosis not present

## 2020-02-26 DIAGNOSIS — R296 Repeated falls: Secondary | ICD-10-CM | POA: Diagnosis not present

## 2020-02-26 DIAGNOSIS — R2681 Unsteadiness on feet: Secondary | ICD-10-CM | POA: Diagnosis not present

## 2020-02-26 DIAGNOSIS — R489 Unspecified symbolic dysfunctions: Secondary | ICD-10-CM | POA: Diagnosis not present

## 2020-02-26 DIAGNOSIS — M6281 Muscle weakness (generalized): Secondary | ICD-10-CM | POA: Diagnosis not present

## 2020-02-26 DIAGNOSIS — R41841 Cognitive communication deficit: Secondary | ICD-10-CM | POA: Diagnosis not present

## 2020-02-27 DIAGNOSIS — M6281 Muscle weakness (generalized): Secondary | ICD-10-CM | POA: Diagnosis not present

## 2020-02-27 DIAGNOSIS — R296 Repeated falls: Secondary | ICD-10-CM | POA: Diagnosis not present

## 2020-02-27 DIAGNOSIS — R489 Unspecified symbolic dysfunctions: Secondary | ICD-10-CM | POA: Diagnosis not present

## 2020-02-27 DIAGNOSIS — R2681 Unsteadiness on feet: Secondary | ICD-10-CM | POA: Diagnosis not present

## 2020-02-27 DIAGNOSIS — R41841 Cognitive communication deficit: Secondary | ICD-10-CM | POA: Diagnosis not present

## 2020-03-01 DIAGNOSIS — R41841 Cognitive communication deficit: Secondary | ICD-10-CM | POA: Diagnosis not present

## 2020-03-01 DIAGNOSIS — R2681 Unsteadiness on feet: Secondary | ICD-10-CM | POA: Diagnosis not present

## 2020-03-01 DIAGNOSIS — R296 Repeated falls: Secondary | ICD-10-CM | POA: Diagnosis not present

## 2020-03-01 DIAGNOSIS — M6281 Muscle weakness (generalized): Secondary | ICD-10-CM | POA: Diagnosis not present

## 2020-03-01 DIAGNOSIS — R489 Unspecified symbolic dysfunctions: Secondary | ICD-10-CM | POA: Diagnosis not present

## 2020-03-10 DIAGNOSIS — M6281 Muscle weakness (generalized): Secondary | ICD-10-CM | POA: Diagnosis not present

## 2020-03-10 DIAGNOSIS — R2681 Unsteadiness on feet: Secondary | ICD-10-CM | POA: Diagnosis not present

## 2020-03-10 DIAGNOSIS — R489 Unspecified symbolic dysfunctions: Secondary | ICD-10-CM | POA: Diagnosis not present

## 2020-03-10 DIAGNOSIS — R41841 Cognitive communication deficit: Secondary | ICD-10-CM | POA: Diagnosis not present

## 2020-03-10 DIAGNOSIS — R296 Repeated falls: Secondary | ICD-10-CM | POA: Diagnosis not present

## 2020-03-11 DIAGNOSIS — R489 Unspecified symbolic dysfunctions: Secondary | ICD-10-CM | POA: Diagnosis not present

## 2020-03-11 DIAGNOSIS — R296 Repeated falls: Secondary | ICD-10-CM | POA: Diagnosis not present

## 2020-03-11 DIAGNOSIS — R41841 Cognitive communication deficit: Secondary | ICD-10-CM | POA: Diagnosis not present

## 2020-03-11 DIAGNOSIS — R2681 Unsteadiness on feet: Secondary | ICD-10-CM | POA: Diagnosis not present

## 2020-03-11 DIAGNOSIS — M6281 Muscle weakness (generalized): Secondary | ICD-10-CM | POA: Diagnosis not present

## 2020-03-12 DIAGNOSIS — R489 Unspecified symbolic dysfunctions: Secondary | ICD-10-CM | POA: Diagnosis not present

## 2020-03-12 DIAGNOSIS — R41841 Cognitive communication deficit: Secondary | ICD-10-CM | POA: Diagnosis not present

## 2020-03-12 DIAGNOSIS — M6281 Muscle weakness (generalized): Secondary | ICD-10-CM | POA: Diagnosis not present

## 2020-03-12 DIAGNOSIS — R2681 Unsteadiness on feet: Secondary | ICD-10-CM | POA: Diagnosis not present

## 2020-03-12 DIAGNOSIS — R296 Repeated falls: Secondary | ICD-10-CM | POA: Diagnosis not present

## 2020-03-12 DIAGNOSIS — Z8616 Personal history of COVID-19: Secondary | ICD-10-CM | POA: Diagnosis not present

## 2020-03-13 DIAGNOSIS — R2681 Unsteadiness on feet: Secondary | ICD-10-CM | POA: Diagnosis not present

## 2020-03-13 DIAGNOSIS — R296 Repeated falls: Secondary | ICD-10-CM | POA: Diagnosis not present

## 2020-03-13 DIAGNOSIS — R41841 Cognitive communication deficit: Secondary | ICD-10-CM | POA: Diagnosis not present

## 2020-03-13 DIAGNOSIS — M6281 Muscle weakness (generalized): Secondary | ICD-10-CM | POA: Diagnosis not present

## 2020-03-13 DIAGNOSIS — R489 Unspecified symbolic dysfunctions: Secondary | ICD-10-CM | POA: Diagnosis not present

## 2020-03-15 DIAGNOSIS — R41841 Cognitive communication deficit: Secondary | ICD-10-CM | POA: Diagnosis not present

## 2020-03-15 DIAGNOSIS — R296 Repeated falls: Secondary | ICD-10-CM | POA: Diagnosis not present

## 2020-03-15 DIAGNOSIS — R489 Unspecified symbolic dysfunctions: Secondary | ICD-10-CM | POA: Diagnosis not present

## 2020-03-15 DIAGNOSIS — M6281 Muscle weakness (generalized): Secondary | ICD-10-CM | POA: Diagnosis not present

## 2020-03-15 DIAGNOSIS — R2681 Unsteadiness on feet: Secondary | ICD-10-CM | POA: Diagnosis not present

## 2020-03-16 DIAGNOSIS — R41841 Cognitive communication deficit: Secondary | ICD-10-CM | POA: Diagnosis not present

## 2020-03-16 DIAGNOSIS — M6281 Muscle weakness (generalized): Secondary | ICD-10-CM | POA: Diagnosis not present

## 2020-03-16 DIAGNOSIS — R489 Unspecified symbolic dysfunctions: Secondary | ICD-10-CM | POA: Diagnosis not present

## 2020-03-16 DIAGNOSIS — R296 Repeated falls: Secondary | ICD-10-CM | POA: Diagnosis not present

## 2020-03-16 DIAGNOSIS — R2681 Unsteadiness on feet: Secondary | ICD-10-CM | POA: Diagnosis not present

## 2020-03-17 DIAGNOSIS — R2681 Unsteadiness on feet: Secondary | ICD-10-CM | POA: Diagnosis not present

## 2020-03-17 DIAGNOSIS — R489 Unspecified symbolic dysfunctions: Secondary | ICD-10-CM | POA: Diagnosis not present

## 2020-03-17 DIAGNOSIS — R41841 Cognitive communication deficit: Secondary | ICD-10-CM | POA: Diagnosis not present

## 2020-03-17 DIAGNOSIS — R296 Repeated falls: Secondary | ICD-10-CM | POA: Diagnosis not present

## 2020-03-17 DIAGNOSIS — M6281 Muscle weakness (generalized): Secondary | ICD-10-CM | POA: Diagnosis not present

## 2020-03-18 DIAGNOSIS — R296 Repeated falls: Secondary | ICD-10-CM | POA: Diagnosis not present

## 2020-03-18 DIAGNOSIS — R41841 Cognitive communication deficit: Secondary | ICD-10-CM | POA: Diagnosis not present

## 2020-03-18 DIAGNOSIS — R2681 Unsteadiness on feet: Secondary | ICD-10-CM | POA: Diagnosis not present

## 2020-03-18 DIAGNOSIS — R489 Unspecified symbolic dysfunctions: Secondary | ICD-10-CM | POA: Diagnosis not present

## 2020-03-18 DIAGNOSIS — M6281 Muscle weakness (generalized): Secondary | ICD-10-CM | POA: Diagnosis not present

## 2020-03-19 DIAGNOSIS — R296 Repeated falls: Secondary | ICD-10-CM | POA: Diagnosis not present

## 2020-03-19 DIAGNOSIS — M6281 Muscle weakness (generalized): Secondary | ICD-10-CM | POA: Diagnosis not present

## 2020-03-19 DIAGNOSIS — R41841 Cognitive communication deficit: Secondary | ICD-10-CM | POA: Diagnosis not present

## 2020-03-19 DIAGNOSIS — R2681 Unsteadiness on feet: Secondary | ICD-10-CM | POA: Diagnosis not present

## 2020-03-19 DIAGNOSIS — R489 Unspecified symbolic dysfunctions: Secondary | ICD-10-CM | POA: Diagnosis not present

## 2020-03-21 DIAGNOSIS — R2681 Unsteadiness on feet: Secondary | ICD-10-CM | POA: Diagnosis not present

## 2020-03-21 DIAGNOSIS — M6281 Muscle weakness (generalized): Secondary | ICD-10-CM | POA: Diagnosis not present

## 2020-03-21 DIAGNOSIS — R41841 Cognitive communication deficit: Secondary | ICD-10-CM | POA: Diagnosis not present

## 2020-03-21 DIAGNOSIS — R296 Repeated falls: Secondary | ICD-10-CM | POA: Diagnosis not present

## 2020-03-21 DIAGNOSIS — R489 Unspecified symbolic dysfunctions: Secondary | ICD-10-CM | POA: Diagnosis not present

## 2020-03-22 DIAGNOSIS — R296 Repeated falls: Secondary | ICD-10-CM | POA: Diagnosis not present

## 2020-03-22 DIAGNOSIS — R2681 Unsteadiness on feet: Secondary | ICD-10-CM | POA: Diagnosis not present

## 2020-03-22 DIAGNOSIS — R489 Unspecified symbolic dysfunctions: Secondary | ICD-10-CM | POA: Diagnosis not present

## 2020-03-22 DIAGNOSIS — R41841 Cognitive communication deficit: Secondary | ICD-10-CM | POA: Diagnosis not present

## 2020-03-22 DIAGNOSIS — M6281 Muscle weakness (generalized): Secondary | ICD-10-CM | POA: Diagnosis not present

## 2020-03-23 DIAGNOSIS — R2681 Unsteadiness on feet: Secondary | ICD-10-CM | POA: Diagnosis not present

## 2020-03-23 DIAGNOSIS — R296 Repeated falls: Secondary | ICD-10-CM | POA: Diagnosis not present

## 2020-03-23 DIAGNOSIS — R489 Unspecified symbolic dysfunctions: Secondary | ICD-10-CM | POA: Diagnosis not present

## 2020-03-23 DIAGNOSIS — M6281 Muscle weakness (generalized): Secondary | ICD-10-CM | POA: Diagnosis not present

## 2020-03-23 DIAGNOSIS — R41841 Cognitive communication deficit: Secondary | ICD-10-CM | POA: Diagnosis not present

## 2020-03-24 DIAGNOSIS — R296 Repeated falls: Secondary | ICD-10-CM | POA: Diagnosis not present

## 2020-03-24 DIAGNOSIS — M6281 Muscle weakness (generalized): Secondary | ICD-10-CM | POA: Diagnosis not present

## 2020-03-24 DIAGNOSIS — R41841 Cognitive communication deficit: Secondary | ICD-10-CM | POA: Diagnosis not present

## 2020-03-24 DIAGNOSIS — R2681 Unsteadiness on feet: Secondary | ICD-10-CM | POA: Diagnosis not present

## 2020-03-24 DIAGNOSIS — R489 Unspecified symbolic dysfunctions: Secondary | ICD-10-CM | POA: Diagnosis not present

## 2020-03-25 DIAGNOSIS — R489 Unspecified symbolic dysfunctions: Secondary | ICD-10-CM | POA: Diagnosis not present

## 2020-03-25 DIAGNOSIS — R296 Repeated falls: Secondary | ICD-10-CM | POA: Diagnosis not present

## 2020-03-25 DIAGNOSIS — M6281 Muscle weakness (generalized): Secondary | ICD-10-CM | POA: Diagnosis not present

## 2020-03-25 DIAGNOSIS — R41841 Cognitive communication deficit: Secondary | ICD-10-CM | POA: Diagnosis not present

## 2020-03-25 DIAGNOSIS — R2681 Unsteadiness on feet: Secondary | ICD-10-CM | POA: Diagnosis not present

## 2020-03-26 DIAGNOSIS — R296 Repeated falls: Secondary | ICD-10-CM | POA: Diagnosis not present

## 2020-03-26 DIAGNOSIS — R2681 Unsteadiness on feet: Secondary | ICD-10-CM | POA: Diagnosis not present

## 2020-03-26 DIAGNOSIS — M6281 Muscle weakness (generalized): Secondary | ICD-10-CM | POA: Diagnosis not present

## 2020-03-26 DIAGNOSIS — R41841 Cognitive communication deficit: Secondary | ICD-10-CM | POA: Diagnosis not present

## 2020-03-26 DIAGNOSIS — R489 Unspecified symbolic dysfunctions: Secondary | ICD-10-CM | POA: Diagnosis not present

## 2020-03-29 DIAGNOSIS — R296 Repeated falls: Secondary | ICD-10-CM | POA: Diagnosis not present

## 2020-03-29 DIAGNOSIS — R2681 Unsteadiness on feet: Secondary | ICD-10-CM | POA: Diagnosis not present

## 2020-03-29 DIAGNOSIS — R41841 Cognitive communication deficit: Secondary | ICD-10-CM | POA: Diagnosis not present

## 2020-03-29 DIAGNOSIS — R489 Unspecified symbolic dysfunctions: Secondary | ICD-10-CM | POA: Diagnosis not present

## 2020-03-29 DIAGNOSIS — M6281 Muscle weakness (generalized): Secondary | ICD-10-CM | POA: Diagnosis not present

## 2020-03-30 DIAGNOSIS — M6281 Muscle weakness (generalized): Secondary | ICD-10-CM | POA: Diagnosis not present

## 2020-03-30 DIAGNOSIS — R41841 Cognitive communication deficit: Secondary | ICD-10-CM | POA: Diagnosis not present

## 2020-03-30 DIAGNOSIS — R489 Unspecified symbolic dysfunctions: Secondary | ICD-10-CM | POA: Diagnosis not present

## 2020-03-30 DIAGNOSIS — R2681 Unsteadiness on feet: Secondary | ICD-10-CM | POA: Diagnosis not present

## 2020-03-30 DIAGNOSIS — R296 Repeated falls: Secondary | ICD-10-CM | POA: Diagnosis not present

## 2020-03-31 DIAGNOSIS — M6281 Muscle weakness (generalized): Secondary | ICD-10-CM | POA: Diagnosis not present

## 2020-03-31 DIAGNOSIS — R296 Repeated falls: Secondary | ICD-10-CM | POA: Diagnosis not present

## 2020-03-31 DIAGNOSIS — R489 Unspecified symbolic dysfunctions: Secondary | ICD-10-CM | POA: Diagnosis not present

## 2020-03-31 DIAGNOSIS — R41841 Cognitive communication deficit: Secondary | ICD-10-CM | POA: Diagnosis not present

## 2020-03-31 DIAGNOSIS — R2681 Unsteadiness on feet: Secondary | ICD-10-CM | POA: Diagnosis not present

## 2020-04-02 DIAGNOSIS — M6281 Muscle weakness (generalized): Secondary | ICD-10-CM | POA: Diagnosis not present

## 2020-04-02 DIAGNOSIS — R296 Repeated falls: Secondary | ICD-10-CM | POA: Diagnosis not present

## 2020-04-02 DIAGNOSIS — R41841 Cognitive communication deficit: Secondary | ICD-10-CM | POA: Diagnosis not present

## 2020-04-02 DIAGNOSIS — R2681 Unsteadiness on feet: Secondary | ICD-10-CM | POA: Diagnosis not present

## 2020-04-02 DIAGNOSIS — R489 Unspecified symbolic dysfunctions: Secondary | ICD-10-CM | POA: Diagnosis not present

## 2020-04-05 DIAGNOSIS — R41841 Cognitive communication deficit: Secondary | ICD-10-CM | POA: Diagnosis not present

## 2020-04-05 DIAGNOSIS — R296 Repeated falls: Secondary | ICD-10-CM | POA: Diagnosis not present

## 2020-04-05 DIAGNOSIS — R2681 Unsteadiness on feet: Secondary | ICD-10-CM | POA: Diagnosis not present

## 2020-04-05 DIAGNOSIS — R489 Unspecified symbolic dysfunctions: Secondary | ICD-10-CM | POA: Diagnosis not present

## 2020-04-05 DIAGNOSIS — M6281 Muscle weakness (generalized): Secondary | ICD-10-CM | POA: Diagnosis not present

## 2020-04-06 DIAGNOSIS — R2681 Unsteadiness on feet: Secondary | ICD-10-CM | POA: Diagnosis not present

## 2020-04-06 DIAGNOSIS — R41841 Cognitive communication deficit: Secondary | ICD-10-CM | POA: Diagnosis not present

## 2020-04-06 DIAGNOSIS — R296 Repeated falls: Secondary | ICD-10-CM | POA: Diagnosis not present

## 2020-04-06 DIAGNOSIS — M6281 Muscle weakness (generalized): Secondary | ICD-10-CM | POA: Diagnosis not present

## 2020-04-06 DIAGNOSIS — R489 Unspecified symbolic dysfunctions: Secondary | ICD-10-CM | POA: Diagnosis not present

## 2020-04-07 DIAGNOSIS — R489 Unspecified symbolic dysfunctions: Secondary | ICD-10-CM | POA: Diagnosis not present

## 2020-04-07 DIAGNOSIS — R41841 Cognitive communication deficit: Secondary | ICD-10-CM | POA: Diagnosis not present

## 2020-04-07 DIAGNOSIS — M6281 Muscle weakness (generalized): Secondary | ICD-10-CM | POA: Diagnosis not present

## 2020-04-07 DIAGNOSIS — R2681 Unsteadiness on feet: Secondary | ICD-10-CM | POA: Diagnosis not present

## 2020-04-07 DIAGNOSIS — R296 Repeated falls: Secondary | ICD-10-CM | POA: Diagnosis not present

## 2020-04-08 DIAGNOSIS — R296 Repeated falls: Secondary | ICD-10-CM | POA: Diagnosis not present

## 2020-04-08 DIAGNOSIS — M6281 Muscle weakness (generalized): Secondary | ICD-10-CM | POA: Diagnosis not present

## 2020-04-08 DIAGNOSIS — R2681 Unsteadiness on feet: Secondary | ICD-10-CM | POA: Diagnosis not present

## 2020-04-08 DIAGNOSIS — R489 Unspecified symbolic dysfunctions: Secondary | ICD-10-CM | POA: Diagnosis not present

## 2020-04-08 DIAGNOSIS — R41841 Cognitive communication deficit: Secondary | ICD-10-CM | POA: Diagnosis not present

## 2020-04-12 DIAGNOSIS — R2681 Unsteadiness on feet: Secondary | ICD-10-CM | POA: Diagnosis not present

## 2020-04-12 DIAGNOSIS — M6281 Muscle weakness (generalized): Secondary | ICD-10-CM | POA: Diagnosis not present

## 2020-04-12 DIAGNOSIS — R489 Unspecified symbolic dysfunctions: Secondary | ICD-10-CM | POA: Diagnosis not present

## 2020-04-12 DIAGNOSIS — R41841 Cognitive communication deficit: Secondary | ICD-10-CM | POA: Diagnosis not present

## 2020-04-12 DIAGNOSIS — R296 Repeated falls: Secondary | ICD-10-CM | POA: Diagnosis not present

## 2020-04-13 DIAGNOSIS — R41841 Cognitive communication deficit: Secondary | ICD-10-CM | POA: Diagnosis not present

## 2020-04-13 DIAGNOSIS — M6281 Muscle weakness (generalized): Secondary | ICD-10-CM | POA: Diagnosis not present

## 2020-04-13 DIAGNOSIS — R489 Unspecified symbolic dysfunctions: Secondary | ICD-10-CM | POA: Diagnosis not present

## 2020-04-13 DIAGNOSIS — R2681 Unsteadiness on feet: Secondary | ICD-10-CM | POA: Diagnosis not present

## 2020-04-13 DIAGNOSIS — R296 Repeated falls: Secondary | ICD-10-CM | POA: Diagnosis not present

## 2020-04-15 DIAGNOSIS — R296 Repeated falls: Secondary | ICD-10-CM | POA: Diagnosis not present

## 2020-04-15 DIAGNOSIS — M6281 Muscle weakness (generalized): Secondary | ICD-10-CM | POA: Diagnosis not present

## 2020-04-15 DIAGNOSIS — R2681 Unsteadiness on feet: Secondary | ICD-10-CM | POA: Diagnosis not present

## 2020-04-15 DIAGNOSIS — R41841 Cognitive communication deficit: Secondary | ICD-10-CM | POA: Diagnosis not present

## 2020-04-15 DIAGNOSIS — R489 Unspecified symbolic dysfunctions: Secondary | ICD-10-CM | POA: Diagnosis not present

## 2020-04-16 DIAGNOSIS — R296 Repeated falls: Secondary | ICD-10-CM | POA: Diagnosis not present

## 2020-04-16 DIAGNOSIS — R489 Unspecified symbolic dysfunctions: Secondary | ICD-10-CM | POA: Diagnosis not present

## 2020-04-16 DIAGNOSIS — R2681 Unsteadiness on feet: Secondary | ICD-10-CM | POA: Diagnosis not present

## 2020-04-16 DIAGNOSIS — R41841 Cognitive communication deficit: Secondary | ICD-10-CM | POA: Diagnosis not present

## 2020-04-16 DIAGNOSIS — M6281 Muscle weakness (generalized): Secondary | ICD-10-CM | POA: Diagnosis not present

## 2020-04-19 DIAGNOSIS — R296 Repeated falls: Secondary | ICD-10-CM | POA: Diagnosis not present

## 2020-04-19 DIAGNOSIS — M6281 Muscle weakness (generalized): Secondary | ICD-10-CM | POA: Diagnosis not present

## 2020-04-19 DIAGNOSIS — R2681 Unsteadiness on feet: Secondary | ICD-10-CM | POA: Diagnosis not present

## 2020-04-19 DIAGNOSIS — R489 Unspecified symbolic dysfunctions: Secondary | ICD-10-CM | POA: Diagnosis not present

## 2020-04-19 DIAGNOSIS — R41841 Cognitive communication deficit: Secondary | ICD-10-CM | POA: Diagnosis not present

## 2020-04-21 DIAGNOSIS — R41841 Cognitive communication deficit: Secondary | ICD-10-CM | POA: Diagnosis not present

## 2020-04-21 DIAGNOSIS — R489 Unspecified symbolic dysfunctions: Secondary | ICD-10-CM | POA: Diagnosis not present

## 2020-04-21 DIAGNOSIS — R296 Repeated falls: Secondary | ICD-10-CM | POA: Diagnosis not present

## 2020-04-21 DIAGNOSIS — R2681 Unsteadiness on feet: Secondary | ICD-10-CM | POA: Diagnosis not present

## 2020-04-21 DIAGNOSIS — M6281 Muscle weakness (generalized): Secondary | ICD-10-CM | POA: Diagnosis not present

## 2020-04-23 DIAGNOSIS — R296 Repeated falls: Secondary | ICD-10-CM | POA: Diagnosis not present

## 2020-04-23 DIAGNOSIS — R489 Unspecified symbolic dysfunctions: Secondary | ICD-10-CM | POA: Diagnosis not present

## 2020-04-23 DIAGNOSIS — M6281 Muscle weakness (generalized): Secondary | ICD-10-CM | POA: Diagnosis not present

## 2020-04-23 DIAGNOSIS — R2681 Unsteadiness on feet: Secondary | ICD-10-CM | POA: Diagnosis not present

## 2020-04-23 DIAGNOSIS — R41841 Cognitive communication deficit: Secondary | ICD-10-CM | POA: Diagnosis not present

## 2020-04-24 DIAGNOSIS — R489 Unspecified symbolic dysfunctions: Secondary | ICD-10-CM | POA: Diagnosis not present

## 2020-04-24 DIAGNOSIS — R2681 Unsteadiness on feet: Secondary | ICD-10-CM | POA: Diagnosis not present

## 2020-04-24 DIAGNOSIS — R41841 Cognitive communication deficit: Secondary | ICD-10-CM | POA: Diagnosis not present

## 2020-04-24 DIAGNOSIS — R296 Repeated falls: Secondary | ICD-10-CM | POA: Diagnosis not present

## 2020-04-24 DIAGNOSIS — M6281 Muscle weakness (generalized): Secondary | ICD-10-CM | POA: Diagnosis not present

## 2020-04-26 DIAGNOSIS — M6281 Muscle weakness (generalized): Secondary | ICD-10-CM | POA: Diagnosis not present

## 2020-04-26 DIAGNOSIS — R489 Unspecified symbolic dysfunctions: Secondary | ICD-10-CM | POA: Diagnosis not present

## 2020-04-26 DIAGNOSIS — R296 Repeated falls: Secondary | ICD-10-CM | POA: Diagnosis not present

## 2020-04-26 DIAGNOSIS — R41841 Cognitive communication deficit: Secondary | ICD-10-CM | POA: Diagnosis not present

## 2020-04-26 DIAGNOSIS — R2681 Unsteadiness on feet: Secondary | ICD-10-CM | POA: Diagnosis not present

## 2020-04-28 DIAGNOSIS — R2681 Unsteadiness on feet: Secondary | ICD-10-CM | POA: Diagnosis not present

## 2020-04-28 DIAGNOSIS — R296 Repeated falls: Secondary | ICD-10-CM | POA: Diagnosis not present

## 2020-04-28 DIAGNOSIS — M6281 Muscle weakness (generalized): Secondary | ICD-10-CM | POA: Diagnosis not present

## 2020-04-28 DIAGNOSIS — R41841 Cognitive communication deficit: Secondary | ICD-10-CM | POA: Diagnosis not present

## 2020-04-28 DIAGNOSIS — R489 Unspecified symbolic dysfunctions: Secondary | ICD-10-CM | POA: Diagnosis not present

## 2020-04-29 DIAGNOSIS — R2681 Unsteadiness on feet: Secondary | ICD-10-CM | POA: Diagnosis not present

## 2020-04-29 DIAGNOSIS — R41841 Cognitive communication deficit: Secondary | ICD-10-CM | POA: Diagnosis not present

## 2020-04-29 DIAGNOSIS — M6281 Muscle weakness (generalized): Secondary | ICD-10-CM | POA: Diagnosis not present

## 2020-04-29 DIAGNOSIS — R489 Unspecified symbolic dysfunctions: Secondary | ICD-10-CM | POA: Diagnosis not present

## 2020-04-29 DIAGNOSIS — R296 Repeated falls: Secondary | ICD-10-CM | POA: Diagnosis not present

## 2020-04-30 DIAGNOSIS — F3341 Major depressive disorder, recurrent, in partial remission: Secondary | ICD-10-CM | POA: Diagnosis not present

## 2020-04-30 DIAGNOSIS — R41841 Cognitive communication deficit: Secondary | ICD-10-CM | POA: Diagnosis not present

## 2020-04-30 DIAGNOSIS — R2681 Unsteadiness on feet: Secondary | ICD-10-CM | POA: Diagnosis not present

## 2020-04-30 DIAGNOSIS — M545 Low back pain, unspecified: Secondary | ICD-10-CM | POA: Diagnosis not present

## 2020-04-30 DIAGNOSIS — M6281 Muscle weakness (generalized): Secondary | ICD-10-CM | POA: Diagnosis not present

## 2020-04-30 DIAGNOSIS — G8929 Other chronic pain: Secondary | ICD-10-CM | POA: Diagnosis not present

## 2020-04-30 DIAGNOSIS — R296 Repeated falls: Secondary | ICD-10-CM | POA: Diagnosis not present

## 2020-04-30 DIAGNOSIS — R489 Unspecified symbolic dysfunctions: Secondary | ICD-10-CM | POA: Diagnosis not present

## 2020-05-03 DIAGNOSIS — R41841 Cognitive communication deficit: Secondary | ICD-10-CM | POA: Diagnosis not present

## 2020-05-03 DIAGNOSIS — R296 Repeated falls: Secondary | ICD-10-CM | POA: Diagnosis not present

## 2020-05-03 DIAGNOSIS — R2681 Unsteadiness on feet: Secondary | ICD-10-CM | POA: Diagnosis not present

## 2020-05-03 DIAGNOSIS — R489 Unspecified symbolic dysfunctions: Secondary | ICD-10-CM | POA: Diagnosis not present

## 2020-05-03 DIAGNOSIS — M6281 Muscle weakness (generalized): Secondary | ICD-10-CM | POA: Diagnosis not present

## 2020-05-04 DIAGNOSIS — R489 Unspecified symbolic dysfunctions: Secondary | ICD-10-CM | POA: Diagnosis not present

## 2020-05-04 DIAGNOSIS — R41841 Cognitive communication deficit: Secondary | ICD-10-CM | POA: Diagnosis not present

## 2020-05-04 DIAGNOSIS — M6281 Muscle weakness (generalized): Secondary | ICD-10-CM | POA: Diagnosis not present

## 2020-05-04 DIAGNOSIS — R296 Repeated falls: Secondary | ICD-10-CM | POA: Diagnosis not present

## 2020-05-04 DIAGNOSIS — R2681 Unsteadiness on feet: Secondary | ICD-10-CM | POA: Diagnosis not present

## 2020-05-05 DIAGNOSIS — M6281 Muscle weakness (generalized): Secondary | ICD-10-CM | POA: Diagnosis not present

## 2020-05-05 DIAGNOSIS — R2681 Unsteadiness on feet: Secondary | ICD-10-CM | POA: Diagnosis not present

## 2020-05-05 DIAGNOSIS — R296 Repeated falls: Secondary | ICD-10-CM | POA: Diagnosis not present

## 2020-05-05 DIAGNOSIS — R41841 Cognitive communication deficit: Secondary | ICD-10-CM | POA: Diagnosis not present

## 2020-05-05 DIAGNOSIS — R489 Unspecified symbolic dysfunctions: Secondary | ICD-10-CM | POA: Diagnosis not present

## 2020-05-06 DIAGNOSIS — R2681 Unsteadiness on feet: Secondary | ICD-10-CM | POA: Diagnosis not present

## 2020-05-06 DIAGNOSIS — R489 Unspecified symbolic dysfunctions: Secondary | ICD-10-CM | POA: Diagnosis not present

## 2020-05-06 DIAGNOSIS — R296 Repeated falls: Secondary | ICD-10-CM | POA: Diagnosis not present

## 2020-05-06 DIAGNOSIS — M6281 Muscle weakness (generalized): Secondary | ICD-10-CM | POA: Diagnosis not present

## 2020-05-06 DIAGNOSIS — R41841 Cognitive communication deficit: Secondary | ICD-10-CM | POA: Diagnosis not present

## 2020-05-07 DIAGNOSIS — R2681 Unsteadiness on feet: Secondary | ICD-10-CM | POA: Diagnosis not present

## 2020-05-07 DIAGNOSIS — M6281 Muscle weakness (generalized): Secondary | ICD-10-CM | POA: Diagnosis not present

## 2020-05-07 DIAGNOSIS — R41841 Cognitive communication deficit: Secondary | ICD-10-CM | POA: Diagnosis not present

## 2020-05-07 DIAGNOSIS — R489 Unspecified symbolic dysfunctions: Secondary | ICD-10-CM | POA: Diagnosis not present

## 2020-05-07 DIAGNOSIS — R296 Repeated falls: Secondary | ICD-10-CM | POA: Diagnosis not present

## 2020-05-10 DIAGNOSIS — M6281 Muscle weakness (generalized): Secondary | ICD-10-CM | POA: Diagnosis not present

## 2020-05-10 DIAGNOSIS — R489 Unspecified symbolic dysfunctions: Secondary | ICD-10-CM | POA: Diagnosis not present

## 2020-05-10 DIAGNOSIS — R41841 Cognitive communication deficit: Secondary | ICD-10-CM | POA: Diagnosis not present

## 2020-05-10 DIAGNOSIS — R2681 Unsteadiness on feet: Secondary | ICD-10-CM | POA: Diagnosis not present

## 2020-05-10 DIAGNOSIS — R296 Repeated falls: Secondary | ICD-10-CM | POA: Diagnosis not present

## 2020-05-11 DIAGNOSIS — R2681 Unsteadiness on feet: Secondary | ICD-10-CM | POA: Diagnosis not present

## 2020-05-11 DIAGNOSIS — R296 Repeated falls: Secondary | ICD-10-CM | POA: Diagnosis not present

## 2020-05-11 DIAGNOSIS — R489 Unspecified symbolic dysfunctions: Secondary | ICD-10-CM | POA: Diagnosis not present

## 2020-05-11 DIAGNOSIS — M6281 Muscle weakness (generalized): Secondary | ICD-10-CM | POA: Diagnosis not present

## 2020-05-11 DIAGNOSIS — R41841 Cognitive communication deficit: Secondary | ICD-10-CM | POA: Diagnosis not present

## 2020-05-12 DIAGNOSIS — R2681 Unsteadiness on feet: Secondary | ICD-10-CM | POA: Diagnosis not present

## 2020-05-12 DIAGNOSIS — R296 Repeated falls: Secondary | ICD-10-CM | POA: Diagnosis not present

## 2020-05-12 DIAGNOSIS — R41841 Cognitive communication deficit: Secondary | ICD-10-CM | POA: Diagnosis not present

## 2020-05-12 DIAGNOSIS — R489 Unspecified symbolic dysfunctions: Secondary | ICD-10-CM | POA: Diagnosis not present

## 2020-05-12 DIAGNOSIS — M6281 Muscle weakness (generalized): Secondary | ICD-10-CM | POA: Diagnosis not present

## 2020-05-16 IMAGING — CT CT ORBITS W/ CM
3 of 4 series · 13 of 47 positions shown, 15 images · IV contrast (Omni 300)
Comparison: MRI head August 05, 2017

CLINICAL DATA: Constant RIGHT eye pain for 5 weeks, blurry vision.
Headache. History of migraines, mild dementia and meningioma
resection.

EXAM:
CT HEAD WITHOUT CONTRAST
CT ORBITS WITH CONTRAST
TECHNIQUE: Multidetector CT imaging of the head was performed following the
standard protocol without intravenous contrast. Multidetector CT
imaging of the orbits were performed following the standard protocol
during bolus administration of intravenous contrast.
CONTRAST:  75mL OMNIPAQUE IOHEXOL 300 MG/ML  SOLN

[Series 3: facialbone 2.0 ax st · axial · 0.33mm/px · z∈[-125,-33]mm · 7 of 54 slices shown, 9 images]
[im 4/54  brain]
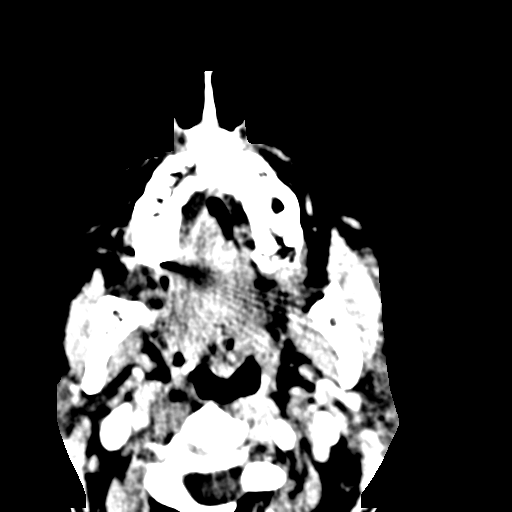
[im 4/54  bone]
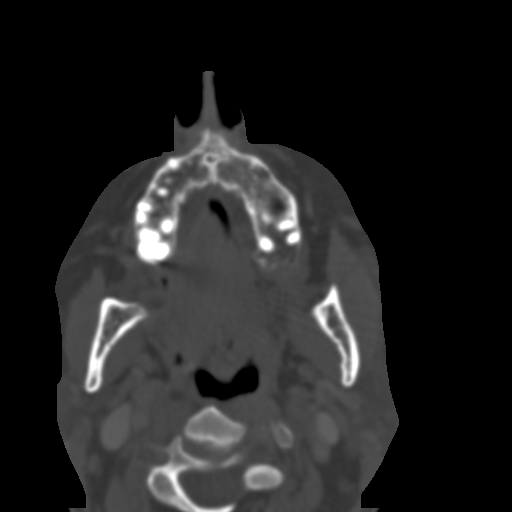
[im 12/54  bone]
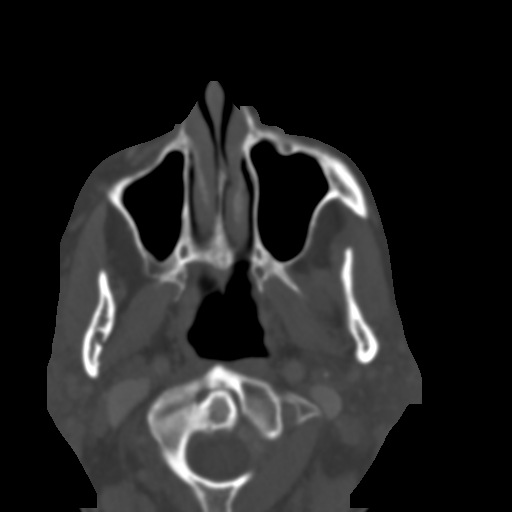
[im 19/54  bone]
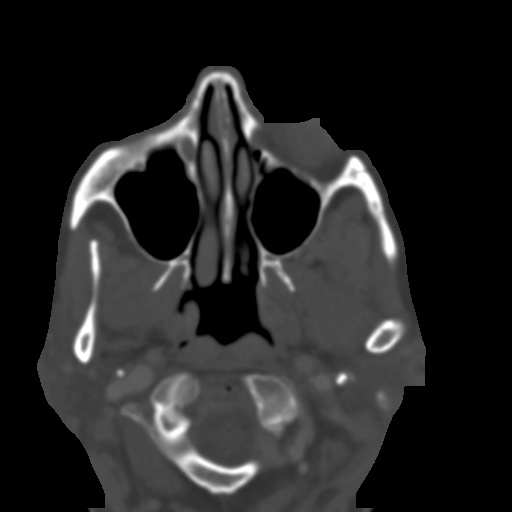
[im 27/54  bone]
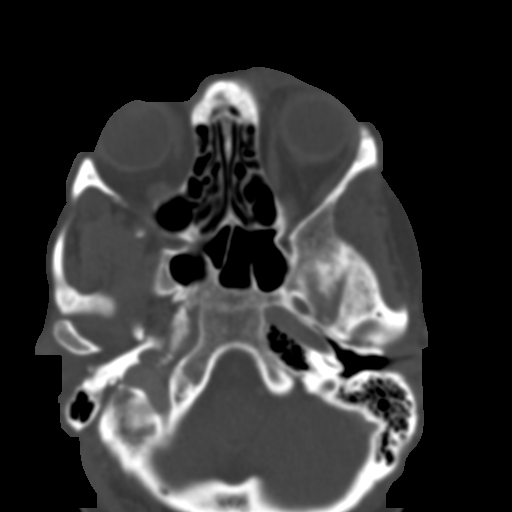
[im 35/54  brain]
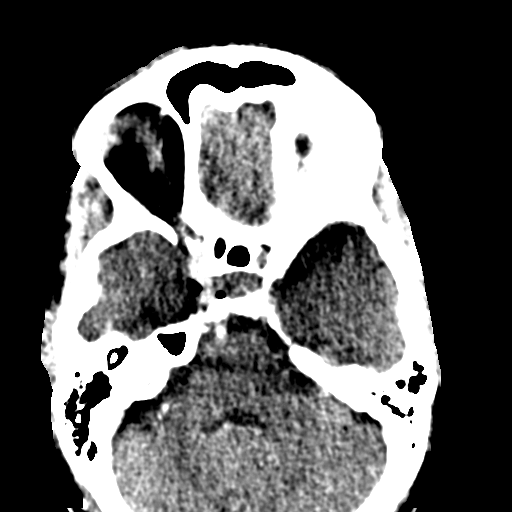
[im 35/54  bone]
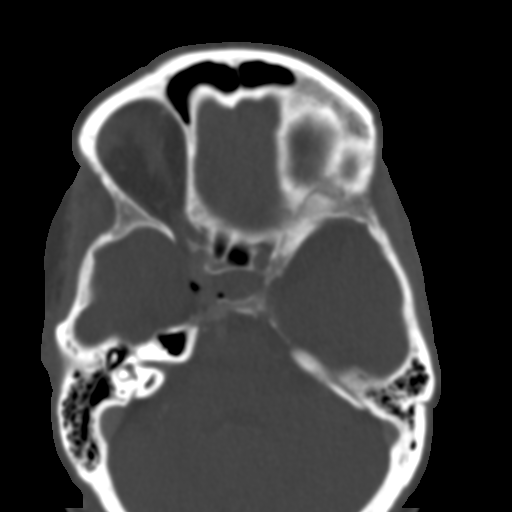
[im 42/54  bone]
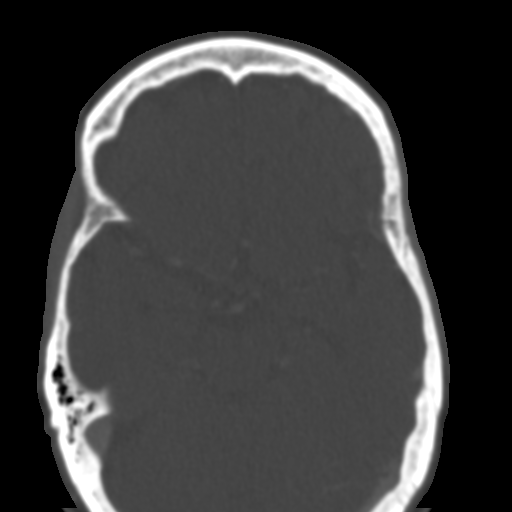
[im 50/54  bone]
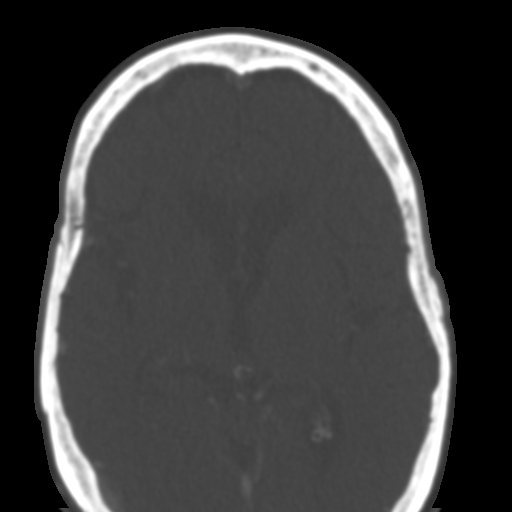

[Series 7: facialbone 2.0 cor st · coronal · 0.21mm/px · 3 of 97 slices shown]
[im 33/97  bone]
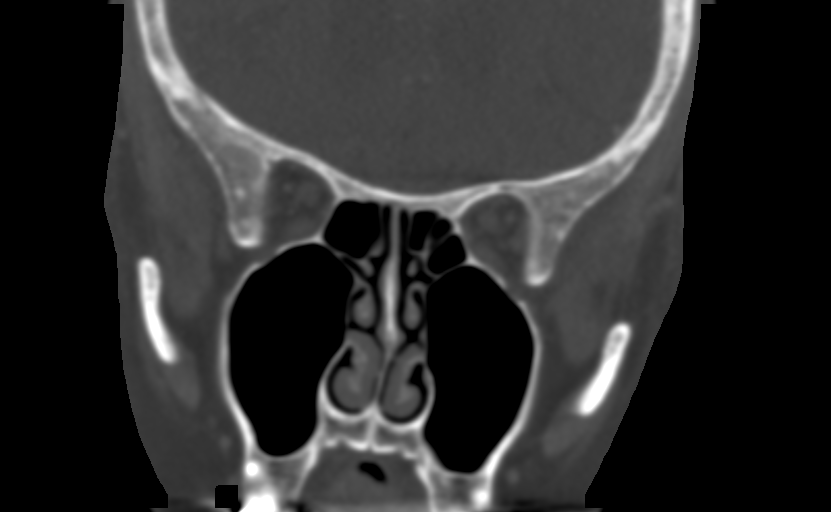
[im 43/97  bone]
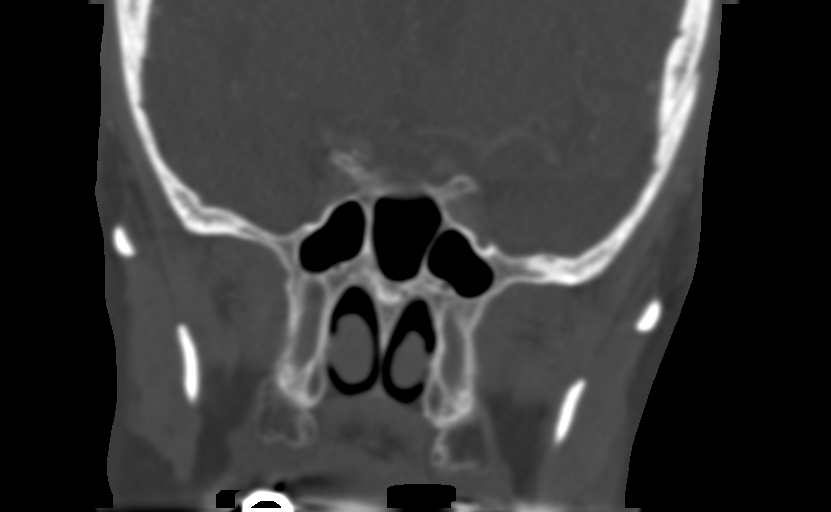
[im 54/97  bone]
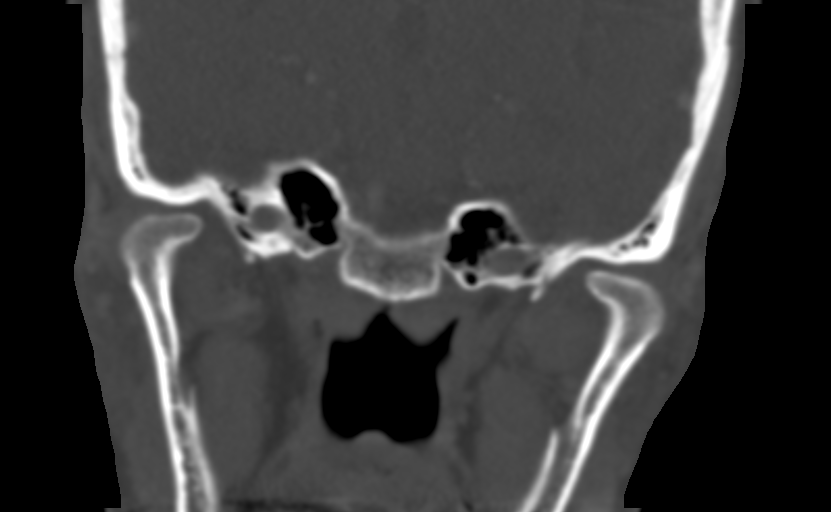

[Series 8: facialbone 2.0 sag st · sagittal · 0.21mm/px · 3 of 76 slices shown]
[im 26/76  bone]
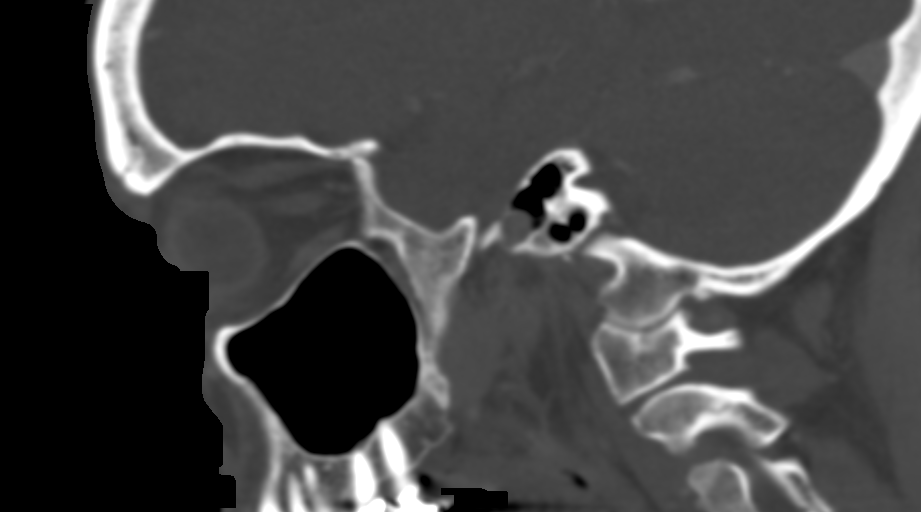
[im 38/76  bone]
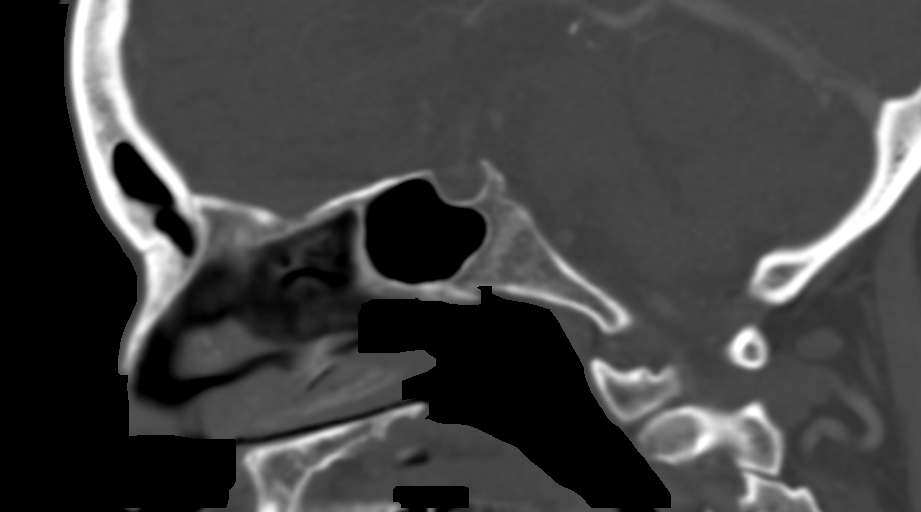
[im 51/76  bone]
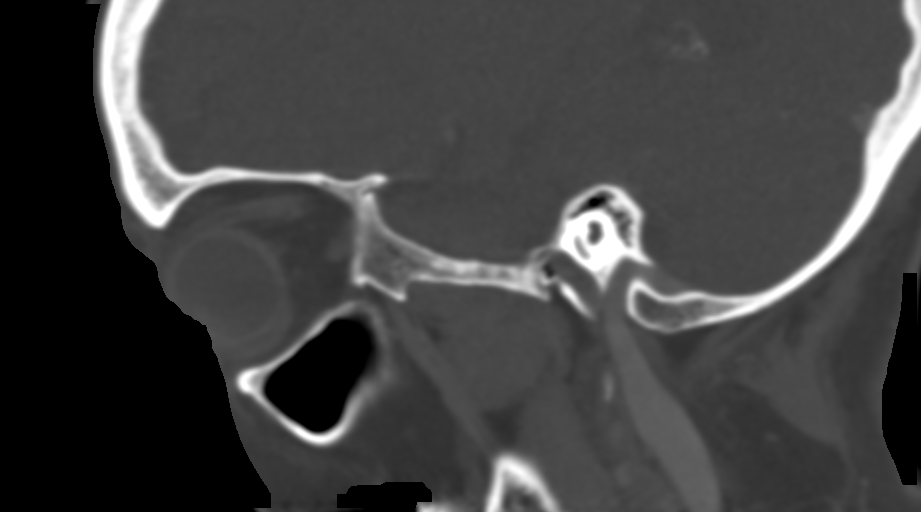

[13 of 47 positions shown; findings below may reference images not displayed]

FINDINGS: CT HEAD FINDINGS

BRAIN: No intraparenchymal hemorrhage, mass effect nor midline
shift. The ventricles and sulci are normal for age. Patchy
supratentorial white matter hypodensities within normal range for
patient's age, though non-specific are most compatible with chronic
small vessel ischemic disease. Small area LEFT frontal
encephalomalacia stable from prior imaging. Old RIGHT basal ganglia
cystic infarct. Old tiny LEFT basal ganglia lacunar infarct. Old
small RIGHT cerebellar infarcts. No acute large vascular territory
infarcts. No abnormal extra-axial fluid collections. Basal cisterns
are patent.

VASCULAR: Trace calcific atherosclerosis of the carotid siphons.

SKULL: No skull fracture. Status post LEFT frontal craniotomy. No
significant scalp soft tissue swelling.

OTHER: None.

CT ORBITS FINDINGS

ORBITS: Intact ocular globes. Lenses are located. Normal appearance
of the optic nerve sheath complexes. Preservation of the orbital
fat. Normal appearance of the extraocular muscles which are well
located. Superior ophthalmic veins are not enlarged.

VISUALIZED SINUSES: Well aerated paranasal sinus and mastoid air
cells.

SOFT TISSUES/BONES: No significant soft tissue swelling, no
subcutaneous gas or radiopaque foreign bodies. No destructive bony
lesions.
IMPRESSION: CT HEAD:

1. No acute intracranial process.
2. Status post LEFT frontal craniotomy for meningioma resection with
similar LEFT frontal encephalomalacia.
3. Old basal ganglia and RIGHT cerebellar infarcts. Mild chronic
small vessel ischemic changes.

CT ORBITS:

1. Normal contrast enhanced CT orbits.

## 2020-05-17 DIAGNOSIS — R296 Repeated falls: Secondary | ICD-10-CM | POA: Diagnosis not present

## 2020-05-17 DIAGNOSIS — R489 Unspecified symbolic dysfunctions: Secondary | ICD-10-CM | POA: Diagnosis not present

## 2020-05-17 DIAGNOSIS — R41841 Cognitive communication deficit: Secondary | ICD-10-CM | POA: Diagnosis not present

## 2020-05-17 DIAGNOSIS — M6281 Muscle weakness (generalized): Secondary | ICD-10-CM | POA: Diagnosis not present

## 2020-05-17 DIAGNOSIS — R2681 Unsteadiness on feet: Secondary | ICD-10-CM | POA: Diagnosis not present

## 2020-05-19 DIAGNOSIS — M6281 Muscle weakness (generalized): Secondary | ICD-10-CM | POA: Diagnosis not present

## 2020-05-19 DIAGNOSIS — R296 Repeated falls: Secondary | ICD-10-CM | POA: Diagnosis not present

## 2020-05-19 DIAGNOSIS — R489 Unspecified symbolic dysfunctions: Secondary | ICD-10-CM | POA: Diagnosis not present

## 2020-05-19 DIAGNOSIS — R41841 Cognitive communication deficit: Secondary | ICD-10-CM | POA: Diagnosis not present

## 2020-05-19 DIAGNOSIS — R2681 Unsteadiness on feet: Secondary | ICD-10-CM | POA: Diagnosis not present

## 2020-05-20 DIAGNOSIS — R296 Repeated falls: Secondary | ICD-10-CM | POA: Diagnosis not present

## 2020-05-20 DIAGNOSIS — R2681 Unsteadiness on feet: Secondary | ICD-10-CM | POA: Diagnosis not present

## 2020-05-21 DIAGNOSIS — R296 Repeated falls: Secondary | ICD-10-CM | POA: Diagnosis not present

## 2020-05-21 DIAGNOSIS — R2681 Unsteadiness on feet: Secondary | ICD-10-CM | POA: Diagnosis not present

## 2020-05-27 DIAGNOSIS — R296 Repeated falls: Secondary | ICD-10-CM | POA: Diagnosis not present

## 2020-05-27 DIAGNOSIS — R2681 Unsteadiness on feet: Secondary | ICD-10-CM | POA: Diagnosis not present

## 2020-05-28 DIAGNOSIS — R2681 Unsteadiness on feet: Secondary | ICD-10-CM | POA: Diagnosis not present

## 2020-05-28 DIAGNOSIS — R296 Repeated falls: Secondary | ICD-10-CM | POA: Diagnosis not present

## 2020-06-03 DIAGNOSIS — R2681 Unsteadiness on feet: Secondary | ICD-10-CM | POA: Diagnosis not present

## 2020-06-03 DIAGNOSIS — R296 Repeated falls: Secondary | ICD-10-CM | POA: Diagnosis not present

## 2020-06-04 DIAGNOSIS — R296 Repeated falls: Secondary | ICD-10-CM | POA: Diagnosis not present

## 2020-06-04 DIAGNOSIS — R2681 Unsteadiness on feet: Secondary | ICD-10-CM | POA: Diagnosis not present

## 2020-06-07 DIAGNOSIS — R2681 Unsteadiness on feet: Secondary | ICD-10-CM | POA: Diagnosis not present

## 2020-06-07 DIAGNOSIS — R296 Repeated falls: Secondary | ICD-10-CM | POA: Diagnosis not present

## 2020-06-19 ENCOUNTER — Encounter (HOSPITAL_COMMUNITY): Payer: Self-pay

## 2020-06-19 ENCOUNTER — Emergency Department (HOSPITAL_COMMUNITY)
Admission: EM | Admit: 2020-06-19 | Discharge: 2020-06-19 | Disposition: A | Discharge status: Home / Self Care | Payer: Medicare PPO | Attending: Emergency Medicine | Admitting: Emergency Medicine

## 2020-06-19 ENCOUNTER — Other Ambulatory Visit: Payer: Self-pay

## 2020-06-19 DIAGNOSIS — Z85828 Personal history of other malignant neoplasm of skin: Secondary | ICD-10-CM | POA: Diagnosis not present

## 2020-06-19 DIAGNOSIS — G4489 Other headache syndrome: Secondary | ICD-10-CM | POA: Diagnosis not present

## 2020-06-19 DIAGNOSIS — I129 Hypertensive chronic kidney disease with stage 1 through stage 4 chronic kidney disease, or unspecified chronic kidney disease: Secondary | ICD-10-CM | POA: Insufficient documentation

## 2020-06-19 DIAGNOSIS — R519 Headache, unspecified: Secondary | ICD-10-CM | POA: Diagnosis not present

## 2020-06-19 DIAGNOSIS — K219 Gastro-esophageal reflux disease without esophagitis: Secondary | ICD-10-CM | POA: Diagnosis not present

## 2020-06-19 DIAGNOSIS — Z87891 Personal history of nicotine dependence: Secondary | ICD-10-CM | POA: Diagnosis not present

## 2020-06-19 DIAGNOSIS — F0390 Unspecified dementia without behavioral disturbance: Secondary | ICD-10-CM | POA: Insufficient documentation

## 2020-06-19 DIAGNOSIS — K5732 Diverticulitis of large intestine without perforation or abscess without bleeding: Secondary | ICD-10-CM | POA: Diagnosis not present

## 2020-06-19 DIAGNOSIS — Z7982 Long term (current) use of aspirin: Secondary | ICD-10-CM | POA: Insufficient documentation

## 2020-06-19 DIAGNOSIS — N189 Chronic kidney disease, unspecified: Secondary | ICD-10-CM | POA: Insufficient documentation

## 2020-06-19 DIAGNOSIS — R1084 Generalized abdominal pain: Secondary | ICD-10-CM | POA: Diagnosis not present

## 2020-06-19 DIAGNOSIS — R1032 Left lower quadrant pain: Secondary | ICD-10-CM | POA: Diagnosis not present

## 2020-06-19 DIAGNOSIS — K5792 Diverticulitis of intestine, part unspecified, without perforation or abscess without bleeding: Secondary | ICD-10-CM

## 2020-06-19 DIAGNOSIS — Z79899 Other long term (current) drug therapy: Secondary | ICD-10-CM | POA: Diagnosis not present

## 2020-06-19 LAB — URINALYSIS, ROUTINE W REFLEX MICROSCOPIC
Bilirubin Urine: NEGATIVE
Glucose, UA: NEGATIVE mg/dL
Ketones, ur: NEGATIVE mg/dL
Nitrite: NEGATIVE
Protein, ur: NEGATIVE mg/dL
Specific Gravity, Urine: 1.006 (ref 1.005–1.030)
pH: 9 — ABNORMAL HIGH (ref 5.0–8.0)

## 2020-06-19 LAB — CBC
HCT: 47.6 % — ABNORMAL HIGH (ref 36.0–46.0)
Hemoglobin: 15.4 g/dL — ABNORMAL HIGH (ref 12.0–15.0)
MCH: 29.7 pg (ref 26.0–34.0)
MCHC: 32.4 g/dL (ref 30.0–36.0)
MCV: 91.9 fL (ref 80.0–100.0)
Platelets: 253 10*3/uL (ref 150–400)
RBC: 5.18 MIL/uL — ABNORMAL HIGH (ref 3.87–5.11)
RDW: 15.4 % (ref 11.5–15.5)
WBC: 10.5 10*3/uL (ref 4.0–10.5)
nRBC: 0 % (ref 0.0–0.2)

## 2020-06-19 LAB — COMPREHENSIVE METABOLIC PANEL
ALT: 9 U/L (ref 0–44)
AST: 15 U/L (ref 15–41)
Albumin: 3.1 g/dL — ABNORMAL LOW (ref 3.5–5.0)
Alkaline Phosphatase: 54 U/L (ref 38–126)
Anion gap: 8 (ref 5–15)
BUN: 13 mg/dL (ref 8–23)
CO2: 22 mmol/L (ref 22–32)
Calcium: 8.5 mg/dL — ABNORMAL LOW (ref 8.9–10.3)
Chloride: 109 mmol/L (ref 98–111)
Creatinine, Ser: 1.28 mg/dL — ABNORMAL HIGH (ref 0.44–1.00)
GFR, Estimated: 43 mL/min — ABNORMAL LOW (ref >60)
Glucose, Bld: 93 mg/dL (ref 70–99)
Potassium: 3.8 mmol/L (ref 3.5–5.1)
Sodium: 139 mmol/L (ref 135–145)
Total Bilirubin: 0.3 mg/dL (ref 0.3–1.2)
Total Protein: 5.5 g/dL — ABNORMAL LOW (ref 6.5–8.1)

## 2020-06-19 LAB — LIPASE, BLOOD: Lipase: 36 U/L (ref 11–51)

## 2020-06-19 MED ORDER — FENTANYL CITRATE (PF) 100 MCG/2ML IJ SOLN
50.0000 ug | Freq: Once | INTRAMUSCULAR | Status: AC
  Administered 2020-06-19: 50 ug via INTRAVENOUS
  Filled 2020-06-19: qty 2

## 2020-06-19 MED ORDER — ACETAMINOPHEN 325 MG PO TABS
650.0000 mg | ORAL_TABLET | Freq: Once | ORAL | Status: AC
  Administered 2020-06-19: 650 mg via ORAL
  Filled 2020-06-19: qty 2

## 2020-06-19 MED ORDER — AMOXICILLIN-POT CLAVULANATE 500-125 MG PO TABS: 1.0000 | ORAL_TABLET | Freq: Three times a day (TID) | ORAL | 0 refills | Status: DC

## 2020-06-19 MED ORDER — ACETAMINOPHEN 500 MG PO TABS
500.0000 mg | ORAL_TABLET | Freq: Once | ORAL | Status: AC
  Administered 2020-06-19: 500 mg via ORAL
  Filled 2020-06-19: qty 1

## 2020-06-19 MED ORDER — SODIUM CHLORIDE 0.9 % IV SOLN
3.0000 g | Freq: Once | INTRAVENOUS | Status: AC
  Administered 2020-06-19: 3 g via INTRAVENOUS
  Filled 2020-06-19: qty 8

## 2020-06-19 MED ORDER — METOCLOPRAMIDE HCL 5 MG/ML IJ SOLN
10.0000 mg | Freq: Once | INTRAMUSCULAR | Status: AC
  Administered 2020-06-19: 10 mg via INTRAVENOUS
  Filled 2020-06-19: qty 2

## 2020-06-19 NOTE — ED Notes (Signed)
[facility] called for patient 

## 2020-06-19 NOTE — ED Notes (Signed)
Attempted to call [facility] x2, phone rang until it continued to beep nonstop. Was trying to inform facility on patients discharge and to give the nurse report. Will call them again when PTAR arrives.

## 2020-06-19 NOTE — Discharge Instructions (Addendum)
You have left-sided abdominal pain that we suspect is because of diverticulitis.  All your blood work is reassuring.  Start taking the antibiotics that are prescribed.  Return to the ER if you start having worsening abdominal pain, bloody stools, fevers, chills.

## 2020-06-19 NOTE — ED Provider Notes (Signed)
[place] DEPT Provider Note   CSN: [id number] Arrival date & time: 06/19/20  1811     History Chief Complaint  Patient presents with  . Abdominal Pain    Jamie George is a 75 y.o. female.  HPI     75 year old female comes in with chief complaint of abdominal pain. Patient resides at [facility].  She reports having some abdominal discomfort that started either yesterday or today.  Pain is located on the left lower quadrant.  She has no associated nausea, vomiting, fevers, chills, diarrhea.  She does not think she has bloody stools.  No history of known diverticulosis but she does have history of gastritis.  P.o. intake has been normal.  Review of system was also positive for headache.  Patient states that she has chronic headaches and usually takes Tylenol for it.  Past Medical History:  Diagnosis Date  . Cancer ([place])    skin  . Complication of anesthesia    " difficult to wake me up "  . Depression   . Duodenitis   . GERD (gastroesophageal reflux disease)   . Hypertension   . Insomnia   . Migraines   . Mild dementia [facility])     Patient Active Problem List   Diagnosis Date Noted  . Pneumonia 11/29/2019  . Dyspnea 07/30/2019  . Dementia ([place]) 01/02/2018  . Aggression 01/02/2018  . Dissection of abdominal aorta ([place]) 08/13/2017  . Chest pain 08/13/2017  . Hypertension 08/13/2017  . CKD (chronic kidney disease) 08/13/2017  . Anemia 08/13/2017    Past Surgical History:  Procedure Laterality Date  . ABDOMINAL HYSTERECTOMY    . BACK SURGERY    . BIOPSY  03/04/2018   Procedure: BIOPSY;  Surgeon: Otis Brace, MD;  Location: [facility];  Service: Gastroenterology;;  . BRAIN SURGERY  2013   meningioma  . ENTEROSCOPY N/A 03/04/2018   Procedure: PUSH ENTEROSCOPY;  Surgeon: Otis Brace, MD;  Location: [facility];  Service: Gastroenterology;  Laterality: N/A;  . SHOULDER SURGERY       OB History   No obstetric  history on file.     Family History  Problem Relation Age of Onset  . Cancer Mother   . Stroke Father   . Heart disease Father     Social History   Tobacco Use  . Smoking status: Former Research scientist (life sciences)  . Smokeless tobacco: Former Network engineer  . Vaping Use: Never used  Substance Use Topics  . Alcohol use: Not Currently  . Drug use: Never    Home Medications Prior to Admission medications   Medication Sig Start Date End Date Taking? Authorizing Provider  amoxicillin-clavulanate (AUGMENTIN) 500-125 MG tablet Take 1 tablet (500 mg total) by mouth every 8 (eight) hours. 06/19/20  Yes Varney Biles, MD  acetaminophen (TYLENOL) 325 MG tablet Take 650 mg by mouth in the morning and at bedtime.     [provider]  albuterol (VENTOLIN HFA) 108 (90 Base) MCG/ACT inhaler Inhale 2 puffs into the lungs every 6 (six) hours as needed for wheezing or shortness of breath.    [provider]  aspirin EC 81 MG EC tablet Take 1 tablet (81 mg total) by mouth daily. With food 08/15/17   Roxan Hockey, MD  atorvastatin (LIPITOR) 40 MG tablet Take 40 mg by mouth as directed. Take on Thurs & Sun    [provider]  benzocaine-menthol (CHLORASEPTIC) 6-10 MG lozenge Take 1 lozenge by mouth every 4 (  four) hours as needed for sore throat.    [provider]  calcium carbonate (TUMS) 500 MG chewable tablet Chew 2 tablets by mouth every 12 (twelve) hours as needed for indigestion or heartburn.    [provider]  docusate sodium (COLACE) 100 MG capsule Take 100 mg by mouth daily.    [provider]  donepezil (ARICEPT) 10 MG tablet Take 10 mg by mouth at bedtime.    [provider]  escitalopram (LEXAPRO) 20 MG tablet Take 20 mg by mouth daily.  05/03/17   [provider]  ferrous sulfate 325 (65 FE) MG tablet Take 325 mg by mouth 2 (two) times daily with a meal.    [provider]  fluticasone (FLONASE) 50 MCG/ACT nasal spray  Place 1 spray into both nostrils daily.    [provider]  melatonin 5 MG TABS Take 10 mg by mouth at bedtime.    [provider]  memantine (NAMENDA) 10 MG tablet Take 1 tablet (10 mg total) by mouth 2 (two) times daily. 12/17/17   Penumalli, Earlean Polka, MD  mirtazapine (REMERON) 7.5 MG tablet Take 7.5 mg by mouth at bedtime.    [provider]  pantoprazole (PROTONIX) 40 MG tablet Take 40 mg by mouth 2 (two) times daily.     [provider]  polyethylene glycol (MIRALAX / GLYCOLAX) packet Take 17 g by mouth daily.    [provider]  risperiDONE (RISPERDAL) 0.5 MG tablet Take 1 mg by mouth at bedtime.     [provider]  traZODone (DESYREL) 50 MG tablet Take 50 mg by mouth at bedtime.  05/03/17   [provider]  verapamil (CALAN) 120 MG tablet Take 120 mg by mouth daily.  11/09/19   [provider]  zonisamide (ZONEGRAN) 100 MG capsule Take 200 mg by mouth at bedtime.     [provider]    Allergies    Patient has no known allergies.  Review of Systems   Review of Systems  Constitutional: Positive for activity change.  Respiratory: Negative for shortness of breath.   Cardiovascular: Negative for chest pain.  Gastrointestinal: Positive for abdominal pain. Negative for blood in stool, nausea and vomiting.  Skin: Negative for rash.  Allergic/Immunologic: Negative for immunocompromised state.  Neurological: Positive for headaches. Negative for dizziness, facial asymmetry, speech difficulty, weakness and numbness.  All other systems reviewed and are negative.   Physical Exam Updated Vital Signs BP 139/81   Pulse (!) 58   Temp 98.3 F (36.8 C) (Oral)   Resp 15   SpO2 96%   Physical Exam Vitals and nursing note reviewed.  Constitutional:      Appearance: She is well-developed.  HENT:     Head: Normocephalic and atraumatic.  Eyes:     Pupils: Pupils are equal, round, and reactive to light.   Cardiovascular:     Rate and Rhythm: Normal rate and regular rhythm.     Heart sounds: Normal heart sounds. No murmur heard.   Pulmonary:     Effort: Pulmonary effort is normal. No respiratory distress.  Abdominal:     General: There is no distension.     Palpations: Abdomen is soft.     Tenderness: There is abdominal tenderness in the left lower quadrant. There is no guarding or rebound. Negative signs include Murphy's sign, Rovsing's sign and McBurney's sign.  Musculoskeletal:     Cervical back: Neck supple.  Skin:    General: Skin  is warm and dry.  Neurological:     General: No focal deficit present.     Mental Status: She is alert and oriented to person, place, and time.     Cranial Nerves: No cranial nerve deficit.     Motor: No weakness.     ED Results / Procedures / Treatments   Labs (all labs ordered are listed, but only abnormal results are displayed) Labs Reviewed  COMPREHENSIVE METABOLIC PANEL - Abnormal; Notable for the following components:      Result Value   Creatinine, Ser 1.28 (*)    Calcium 8.5 (*)    Total Protein 5.5 (*)    Albumin 3.1 (*)    GFR, Estimated 43 (*)    All other components within normal limits  CBC - Abnormal; Notable for the following components:   RBC 5.18 (*)    Hemoglobin 15.4 (*)    HCT 47.6 (*)    All other components within normal limits  URINALYSIS, ROUTINE W REFLEX MICROSCOPIC - Abnormal; Notable for the following components:   Color, Urine STRAW (*)    pH 9.0 (*)    Hgb urine dipstick MODERATE (*)    Leukocytes,Ua MODERATE (*)    Bacteria, UA RARE (*)    All other components within normal limits  LIPASE, BLOOD    EKG None  Radiology No results found.  Procedures Procedures   Medications Ordered in ED Medications  acetaminophen (TYLENOL) tablet 500 mg (has no administration in time range)  fentaNYL (SUBLIMAZE) injection 50 mcg (50 mcg Intravenous Given 06/19/20 1944)  metoCLOPramide (REGLAN) injection 10 mg  (10 mg Intravenous Given 06/19/20 1938)  acetaminophen (TYLENOL) tablet 650 mg (650 mg Oral Given 06/19/20 1937)  Ampicillin-Sulbactam (UNASYN) 3 g in sodium chloride 0.9 % 100 mL IVPB (0 g Intravenous Stopped 06/19/20 2032)    ED Course  I have reviewed the triage vital signs and the nursing notes.  Pertinent labs & imaging results that were available during my care of the patient were reviewed by me and considered in my medical decision making (see chart for details).    MDM Rules/Calculators/A&P                          75 year old female comes in with chief complaint of abdominal pain.  With her primary complaint, she does not have any systemic constitutional's are positive.  Her exam was notable for left lower quadrant tenderness without any guarding or rebound.  She denies any history of diverticulosis, but we reviewed prior imaging and patient did have evidence of diverticulosis.  Symptoms are consistent with diverticulitis and we will clinically treat for it.  Hemodynamically she stable, exam is reassuring, white count is normal, patient is nontoxic -we feel comfortable with the clinical diagnosis of diverticulitis and patient will receive Unasyn here followed by Augmentin at home.  Strict ER return precautions discussed.  Headaches appear to be chronic.  No focal neurodeficits.  Tylenol given.  Final Clinical Impression(s) / ED Diagnoses Final diagnoses:  Diverticulitis    Rx / DC Orders ED Discharge Orders         Ordered    amoxicillin-clavulanate (AUGMENTIN) 500-125 MG tablet  Every 8 hours        06/19/20 2232           Varney Biles, MD 06/22/20 [phone]

## 2020-06-19 NOTE — ED Notes (Signed)
Patient asking for tylenol/pain medication. RN messaging Dr. Kathrynn Humble.

## 2020-06-19 NOTE — ED Notes (Signed)
RN attempted to call [facility] again. No answer.

## 2020-06-19 NOTE — ED Triage Notes (Signed)
BIB from [facility], C/C abdominal pain. Patient states she has abdominal pain, when patient asked to show where her pain was she pointed to her head and then abdomen. Hx dementia and AMS.

## 2020-06-20 DIAGNOSIS — I10 Essential (primary) hypertension: Secondary | ICD-10-CM | POA: Diagnosis not present

## 2020-06-20 DIAGNOSIS — Z7401 Bed confinement status: Secondary | ICD-10-CM | POA: Diagnosis not present

## 2020-06-20 DIAGNOSIS — M2550 Pain in unspecified joint: Secondary | ICD-10-CM | POA: Diagnosis not present

## 2020-06-20 DIAGNOSIS — R0902 Hypoxemia: Secondary | ICD-10-CM | POA: Diagnosis not present

## 2020-07-10 ENCOUNTER — Emergency Department (HOSPITAL_COMMUNITY)
Admission: EM | Admit: 2020-07-10 | Discharge: 2020-07-11 | Disposition: A | Discharge status: Home / Self Care | Payer: Medicare PPO | Attending: Emergency Medicine | Admitting: Emergency Medicine

## 2020-07-10 ENCOUNTER — Other Ambulatory Visit: Payer: Self-pay

## 2020-07-10 ENCOUNTER — Emergency Department (HOSPITAL_COMMUNITY): Payer: Medicare PPO

## 2020-07-10 DIAGNOSIS — F0390 Unspecified dementia without behavioral disturbance: Secondary | ICD-10-CM | POA: Insufficient documentation

## 2020-07-10 DIAGNOSIS — W19XXXA Unspecified fall, initial encounter: Secondary | ICD-10-CM

## 2020-07-10 DIAGNOSIS — I129 Hypertensive chronic kidney disease with stage 1 through stage 4 chronic kidney disease, or unspecified chronic kidney disease: Secondary | ICD-10-CM | POA: Insufficient documentation

## 2020-07-10 DIAGNOSIS — Z79899 Other long term (current) drug therapy: Secondary | ICD-10-CM | POA: Diagnosis not present

## 2020-07-10 DIAGNOSIS — Z85828 Personal history of other malignant neoplasm of skin: Secondary | ICD-10-CM | POA: Diagnosis not present

## 2020-07-10 DIAGNOSIS — S161XXA Strain of muscle, fascia and tendon at neck level, initial encounter: Secondary | ICD-10-CM | POA: Diagnosis not present

## 2020-07-10 DIAGNOSIS — N189 Chronic kidney disease, unspecified: Secondary | ICD-10-CM | POA: Diagnosis not present

## 2020-07-10 DIAGNOSIS — S60221A Contusion of right hand, initial encounter: Secondary | ICD-10-CM | POA: Diagnosis not present

## 2020-07-10 DIAGNOSIS — W01198A Fall on same level from slipping, tripping and stumbling with subsequent striking against other object, initial encounter: Secondary | ICD-10-CM | POA: Diagnosis not present

## 2020-07-10 DIAGNOSIS — R0902 Hypoxemia: Secondary | ICD-10-CM | POA: Diagnosis not present

## 2020-07-10 DIAGNOSIS — S6991XA Unspecified injury of right wrist, hand and finger(s), initial encounter: Secondary | ICD-10-CM | POA: Diagnosis present

## 2020-07-10 DIAGNOSIS — Z7982 Long term (current) use of aspirin: Secondary | ICD-10-CM | POA: Diagnosis not present

## 2020-07-10 DIAGNOSIS — I10 Essential (primary) hypertension: Secondary | ICD-10-CM | POA: Diagnosis not present

## 2020-07-10 DIAGNOSIS — R519 Headache, unspecified: Secondary | ICD-10-CM | POA: Diagnosis not present

## 2020-07-10 DIAGNOSIS — Z043 Encounter for examination and observation following other accident: Secondary | ICD-10-CM | POA: Diagnosis not present

## 2020-07-10 DIAGNOSIS — M79641 Pain in right hand: Secondary | ICD-10-CM | POA: Diagnosis not present

## 2020-07-10 DIAGNOSIS — M47812 Spondylosis without myelopathy or radiculopathy, cervical region: Secondary | ICD-10-CM | POA: Diagnosis not present

## 2020-07-10 DIAGNOSIS — Z87891 Personal history of nicotine dependence: Secondary | ICD-10-CM | POA: Insufficient documentation

## 2020-07-10 DIAGNOSIS — R404 Transient alteration of awareness: Secondary | ICD-10-CM | POA: Diagnosis not present

## 2020-07-10 DIAGNOSIS — N926 Irregular menstruation, unspecified: Secondary | ICD-10-CM | POA: Diagnosis not present

## 2020-07-10 DIAGNOSIS — S0990XA Unspecified injury of head, initial encounter: Secondary | ICD-10-CM | POA: Insufficient documentation

## 2020-07-10 DIAGNOSIS — N939 Abnormal uterine and vaginal bleeding, unspecified: Secondary | ICD-10-CM | POA: Diagnosis not present

## 2020-07-10 DIAGNOSIS — R609 Edema, unspecified: Secondary | ICD-10-CM | POA: Diagnosis not present

## 2020-07-10 DIAGNOSIS — R072 Precordial pain: Secondary | ICD-10-CM | POA: Diagnosis not present

## 2020-07-10 DIAGNOSIS — M79643 Pain in unspecified hand: Secondary | ICD-10-CM | POA: Diagnosis not present

## 2020-07-10 MED ORDER — ACETAMINOPHEN 325 MG PO TABS
650.0000 mg | ORAL_TABLET | Freq: Once | ORAL | Status: AC
  Administered 2020-07-10: 650 mg via ORAL
  Filled 2020-07-10: qty 2

## 2020-07-10 NOTE — ED Triage Notes (Signed)
Pt came in via EMS from [facility] with c/o fall. Pt states that her head and neck hurt.She hit her head on the floor. She is also c/o R hand pain. R hand is visibly swollen and bruised. Pt has dementia but can recall fall. No LOC noted

## 2020-07-10 NOTE — ED Provider Notes (Signed)
[place] DEPT Provider Note   CSN: [id number] Arrival date & time: 07/10/20  1924     History Chief Complaint  Patient presents with  . Fall    Jamie George is a 75 y.o. female.  75 year old female brought in by EMS from facility for fall.  Patient has a history of dementia, on aspirin otherwise not on blood thinners.  Patient states that she tripped and fell and hit her hand on the floor.  Reports pain in her head as well as her neck, states that she did hit the floor with her head but denies loss of consciousness.  Reports pain and swelling in her right hand, states that the swelling has improved since the initial injury.  Patient has been ambulatory since the fall without difficulty, no other injuries.        Past Medical History:  Diagnosis Date  . Cancer ([place])    skin  . Complication of anesthesia    " difficult to wake me up "  . Depression   . Duodenitis   . GERD (gastroesophageal reflux disease)   . Hypertension   . Insomnia   . Migraines   . Mild dementia [facility])     Patient Active Problem List   Diagnosis Date Noted  . Pneumonia 11/29/2019  . Dyspnea 07/30/2019  . Dementia ([place]) 01/02/2018  . Aggression 01/02/2018  . Dissection of abdominal aorta ([place]) 08/13/2017  . Chest pain 08/13/2017  . Hypertension 08/13/2017  . CKD (chronic kidney disease) 08/13/2017  . Anemia 08/13/2017    Past Surgical History:  Procedure Laterality Date  . ABDOMINAL HYSTERECTOMY    . BACK SURGERY    . BIOPSY  03/04/2018   Procedure: BIOPSY;  Surgeon: Otis Brace, MD;  Location: [facility];  Service: Gastroenterology;;  . BRAIN SURGERY  2013   meningioma  . ENTEROSCOPY N/A 03/04/2018   Procedure: PUSH ENTEROSCOPY;  Surgeon: Otis Brace, MD;  [facility]: [facility];  Service: Gastroenterology;  Laterality: N/A;  . SHOULDER SURGERY       OB History   No obstetric history on file.     Family History  Problem Relation  Age of Onset  . Cancer Mother   . Stroke Father   . Heart disease Father     Social History   Tobacco Use  . Smoking status: Former Research scientist (life sciences)  . Smokeless tobacco: Former Network engineer  . Vaping Use: Never used  Substance Use Topics  . Alcohol use: Not Currently  . Drug use: Never    Home Medications Prior to Admission medications   Medication Sig Start Date End Date Taking? Authorizing Provider  acetaminophen (TYLENOL) 325 MG tablet Take 650 mg by mouth in the morning and at bedtime.     [provider]  albuterol (VENTOLIN HFA) 108 (90 Base) MCG/ACT inhaler Inhale 2 puffs into the lungs every 6 (six) hours as needed for wheezing or shortness of breath.    [provider]  amoxicillin-clavulanate (AUGMENTIN) 500-125 MG tablet Take 1 tablet (500 mg total) by mouth every 8 (eight) hours. 06/19/20   Varney Biles, MD  aspirin EC 81 MG EC tablet Take 1 tablet (81 mg total) by mouth daily. With food 08/15/17   Roxan Hockey, MD  atorvastatin (LIPITOR) 40 MG tablet Take 40 mg by mouth as directed. Take on Thurs & Sun    [provider]  benzocaine-menthol (CHLORASEPTIC) 6-10 MG lozenge Take 1 lozenge by mouth every 4 (four)  hours as needed for sore throat.    [provider]  calcium carbonate (TUMS) 500 MG chewable tablet Chew 2 tablets by mouth every 12 (twelve) hours as needed for indigestion or heartburn.    [provider]  docusate sodium (COLACE) 100 MG capsule Take 100 mg by mouth daily.    [provider]  donepezil (ARICEPT) 10 MG tablet Take 10 mg by mouth at bedtime.    [provider]  escitalopram (LEXAPRO) 20 MG tablet Take 20 mg by mouth daily.  05/03/17   [provider]  ferrous sulfate 325 (65 FE) MG tablet Take 325 mg by mouth 2 (two) times daily with a meal.    [provider]  fluticasone (FLONASE) 50 MCG/ACT nasal spray Place 1 spray into both nostrils daily.    [provider]  melatonin 5 MG TABS Take 10 mg by mouth at bedtime.    [provider]  memantine (NAMENDA) 10 MG tablet Take 1 tablet (10 mg total) by mouth 2 (two) times daily. 12/17/17   Penumalli, Earlean Polka, MD  mirtazapine (REMERON) 7.5 MG tablet Take 7.5 mg by mouth at bedtime.    [provider]  pantoprazole (PROTONIX) 40 MG tablet Take 40 mg by mouth 2 (two) times daily.     [provider]  polyethylene glycol (MIRALAX / GLYCOLAX) packet Take 17 g by mouth daily.    [provider]  risperiDONE (RISPERDAL) 0.5 MG tablet Take 1 mg by mouth at bedtime.     [provider]  traZODone (DESYREL) 50 MG tablet Take 50 mg by mouth at bedtime.  05/03/17   [provider]  verapamil (CALAN) 120 MG tablet Take 120 mg by mouth daily.  11/09/19   [provider]  zonisamide (ZONEGRAN) 100 MG capsule Take 200 mg by mouth at bedtime.     [provider]    Allergies    Patient has no known allergies.  Review of Systems   Review of Systems  Unable to perform ROS: Dementia  Musculoskeletal: Positive for arthralgias, joint swelling and neck pain. Negative for back pain and gait problem.  Neurological: Positive for headaches.    Physical Exam Updated Vital Signs BP (!) 166/96   Pulse (!) 104   Temp 97.9 F (36.6 C) (Oral)   Resp 18   Ht 5' 8.5" (1.74 m)   Wt 70.8 kg   SpO2 92%   BMI 23.37 kg/m   Physical Exam Vitals and nursing note reviewed.  Constitutional:      General: She is not in acute distress.    Appearance: Normal appearance. She is not diaphoretic.  HENT:     Head: Normocephalic and atraumatic.     Nose: Nose normal.     Mouth/Throat:     Mouth: Mucous membranes are moist.  Eyes:     Extraocular Movements: Extraocular movements intact.     Pupils: Pupils are equal, round, and reactive to light.  Cardiovascular:     Rate and Rhythm: Normal rate and regular rhythm.     Heart sounds: Normal heart sounds.   Pulmonary:     Effort: Pulmonary effort is normal.     Breath sounds: Normal breath sounds.  Musculoskeletal:        General: Swelling, tenderness and signs of injury present.     Cervical back: Tenderness and bony tenderness present.     Thoracic back: No tenderness or bony tenderness.  Lumbar back: No tenderness or bony tenderness.       Back:     Right lower leg: No edema.     Left lower leg: No edema.     Comments: Tenderness to diffuse posterior neck, no crepitus or step-offs No pain with range of motion of lower extremities, no contusions or abrasions noted. Swelling and ecchymosis to right hand with tenderness palpation. Left hand unremarkable, no wrist tenderness.  Skin:    General: Skin is warm and dry.     Findings: Bruising present. No erythema.  Neurological:     General: No focal deficit present.     Mental Status: She is alert.     Sensory: No sensory deficit.     Motor: No weakness.     ED Results / Procedures / Treatments   Labs (all labs ordered are listed, but only abnormal results are displayed) Labs Reviewed - No data to display  EKG None  Radiology CT Head Wo Contrast  Result Date: 07/10/2020 CLINICAL DATA:  Posttraumatic headache after fall yesterday. EXAM: CT HEAD WITHOUT CONTRAST CT CERVICAL SPINE WITHOUT CONTRAST TECHNIQUE: Multidetector CT imaging of the head and cervical spine was performed following the standard protocol without intravenous contrast. Multiplanar CT image reconstructions of the cervical spine were also generated. COMPARISON:  July 30, 2019. FINDINGS: CT HEAD FINDINGS Brain: No evidence of acute infarction, hemorrhage, hydrocephalus, extra-axial collection or mass lesion/mass effect. Vascular: No hyperdense vessel or unexpected calcification. Skull: Status post left frontal craniotomy. No acute abnormality is noted. Sinuses/Orbits: No acute finding. Other: None. CT CERVICAL SPINE FINDINGS Alignment: Normal. Skull base and vertebrae:  No acute fracture. No primary bone lesion or focal pathologic process. Soft tissues and spinal canal: No prevertebral fluid or swelling. No visible canal hematoma. Disc levels: Fusion of C3 and C4 vertebra are noted which may be congenital. Moderate degenerative disc disease is noted at C4-5, C5-6 and C6-7. Upper chest: Negative. Other: None. IMPRESSION: No acute intracranial abnormality seen. Multilevel degenerative disease. No acute abnormality seen cervical spine. Electronically Signed   By: Marijo Conception M.D.   On: 07/10/2020 20:11   CT Cervical Spine Wo Contrast  Result Date: 07/10/2020 CLINICAL DATA:  Posttraumatic headache after fall yesterday. EXAM: CT HEAD WITHOUT CONTRAST CT CERVICAL SPINE WITHOUT CONTRAST TECHNIQUE: Multidetector CT imaging of the head and cervical spine was performed following the standard protocol without intravenous contrast. Multiplanar CT image reconstructions of the cervical spine were also generated. COMPARISON:  July 30, 2019. FINDINGS: CT HEAD FINDINGS Brain: No evidence of acute infarction, hemorrhage, hydrocephalus, extra-axial collection or mass lesion/mass effect. Vascular: No hyperdense vessel or unexpected calcification. Skull: Status post left frontal craniotomy. No acute abnormality is noted. Sinuses/Orbits: No acute finding. Other: None. CT CERVICAL SPINE FINDINGS Alignment: Normal. Skull base and vertebrae: No acute fracture. No primary bone lesion or focal pathologic process. Soft tissues and spinal canal: No prevertebral fluid or swelling. No visible canal hematoma. Disc levels: Fusion of C3 and C4 vertebra are noted which may be congenital. Moderate degenerative disc disease is noted at C4-5, C5-6 and C6-7. Upper chest: Negative. Other: None. IMPRESSION: No acute intracranial abnormality seen. Multilevel degenerative disease. No acute abnormality seen cervical spine. Electronically Signed   By: Marijo Conception M.D.   On: 07/10/2020 20:11   DG Hand Complete  Right  Result Date: 07/10/2020 CLINICAL DATA:  Right hand pain after fall EXAM: RIGHT HAND - COMPLETE 3+ VIEW COMPARISON:  03/24/2019 FINDINGS: No acute fracture or  dislocation. Mild osteoarthritis of the hand and wrist, most pronounced at the first [facility] joint. Mild soft tissue swelling over the dorsum of the hand. IMPRESSION: No acute fracture or dislocation. Mild soft tissue swelling over the dorsum of the hand. Electronically Signed   By: Davina Poke D.O.   On: 07/10/2020 20:12    Procedures Procedures   Medications Ordered in ED Medications  acetaminophen (TYLENOL) tablet 650 mg (has no administration in time range)    ED Course  I have reviewed the triage vital signs and the nursing notes.  Pertinent labs & imaging results that were available during my care of the patient were reviewed by me and considered in my medical decision making (see chart for details).  Clinical Course as of 07/10/20 2018  Sat Jul 11, 8846  2751 75 year old female with history of dementia brought in by EMS from facility for fall today.  Patient reports mechanical fall, states that she tripped over her feet and fell landing on her hand and hitting her head on the ground without loss of consciousness.  Not on blood thinners.  Found to have contusion with swelling to the dorsum of her right hand, x-ray negative for fracture.  Found to have pain to the posterior neck with report of headache.  No obvious head trauma, CT of the head and C-spine are negative for acute injury.  Patient was given Tylenol for her pain, discharged to facility, recommend ice to hand for 20 minutes at a time and Tylenol as needed for pain.  Recommend recheck with PCP in 2 days, return to ED for new or worsening symptoms. [LM]    Clinical Course User Index [LM] Roque Lias   MDM Rules/Calculators/A&P                          Final Clinical Impression(s) / ED Diagnoses Final diagnoses:  Fall, initial encounter  Contusion of  right hand, initial encounter  Injury of head, initial encounter  Acute strain of neck muscle, initial encounter    Rx / DC Orders ED Discharge Orders    None       Roque Lias 07/10/20 2018    Charlesetta Shanks, MD 07/27/20 [phone]

## 2020-07-10 NOTE — Discharge Instructions (Addendum)
Apply ice to hand for 20 minutes at a time to help with pain and swelling. Give Tylenol as needed as directed for pain. Recommend recheck with your doctor in 2 days.  Return to emergency room for worsening or concerning symptoms.

## 2020-07-10 NOTE — ED Notes (Signed)
[facility] called for transport to facility. 

## 2020-07-10 NOTE — ED Notes (Signed)
[facility] present for pt transfer

## 2020-07-11 DIAGNOSIS — R0689 Other abnormalities of breathing: Secondary | ICD-10-CM | POA: Diagnosis not present

## 2020-07-11 DIAGNOSIS — W19XXXA Unspecified fall, initial encounter: Secondary | ICD-10-CM | POA: Diagnosis not present

## 2020-07-11 DIAGNOSIS — Z7401 Bed confinement status: Secondary | ICD-10-CM | POA: Diagnosis not present

## 2020-07-15 DIAGNOSIS — R296 Repeated falls: Secondary | ICD-10-CM | POA: Diagnosis not present

## 2020-07-15 DIAGNOSIS — R2681 Unsteadiness on feet: Secondary | ICD-10-CM | POA: Diagnosis not present

## 2020-07-22 DIAGNOSIS — F331 Major depressive disorder, recurrent, moderate: Secondary | ICD-10-CM | POA: Diagnosis not present

## 2020-07-22 DIAGNOSIS — F419 Anxiety disorder, unspecified: Secondary | ICD-10-CM | POA: Diagnosis not present

## 2020-07-22 DIAGNOSIS — F0391 Unspecified dementia with behavioral disturbance: Secondary | ICD-10-CM | POA: Diagnosis not present

## 2020-07-29 DIAGNOSIS — F330 Major depressive disorder, recurrent, mild: Secondary | ICD-10-CM | POA: Diagnosis not present

## 2020-08-02 DIAGNOSIS — Z79899 Other long term (current) drug therapy: Secondary | ICD-10-CM | POA: Diagnosis not present

## 2020-08-05 DIAGNOSIS — Z79899 Other long term (current) drug therapy: Secondary | ICD-10-CM | POA: Diagnosis not present

## 2020-08-26 DIAGNOSIS — F419 Anxiety disorder, unspecified: Secondary | ICD-10-CM | POA: Diagnosis not present

## 2020-08-26 DIAGNOSIS — F0391 Unspecified dementia with behavioral disturbance: Secondary | ICD-10-CM | POA: Diagnosis not present

## 2020-08-26 DIAGNOSIS — F331 Major depressive disorder, recurrent, moderate: Secondary | ICD-10-CM | POA: Diagnosis not present

## 2020-08-27 DIAGNOSIS — F330 Major depressive disorder, recurrent, mild: Secondary | ICD-10-CM | POA: Diagnosis not present

## 2020-09-03 DIAGNOSIS — F331 Major depressive disorder, recurrent, moderate: Secondary | ICD-10-CM | POA: Diagnosis not present

## 2020-09-03 DIAGNOSIS — F419 Anxiety disorder, unspecified: Secondary | ICD-10-CM | POA: Diagnosis not present

## 2020-09-03 DIAGNOSIS — F0391 Unspecified dementia with behavioral disturbance: Secondary | ICD-10-CM | POA: Diagnosis not present

## 2020-09-03 DIAGNOSIS — F59 Unspecified behavioral syndromes associated with physiological disturbances and physical factors: Secondary | ICD-10-CM | POA: Diagnosis not present

## 2020-09-03 DIAGNOSIS — F5105 Insomnia due to other mental disorder: Secondary | ICD-10-CM | POA: Diagnosis not present

## 2020-09-17 DIAGNOSIS — F330 Major depressive disorder, recurrent, mild: Secondary | ICD-10-CM | POA: Diagnosis not present

## 2020-10-20 DIAGNOSIS — F330 Major depressive disorder, recurrent, mild: Secondary | ICD-10-CM | POA: Diagnosis not present

## 2020-11-05 DIAGNOSIS — F419 Anxiety disorder, unspecified: Secondary | ICD-10-CM | POA: Diagnosis not present

## 2020-11-05 DIAGNOSIS — F0391 Unspecified dementia with behavioral disturbance: Secondary | ICD-10-CM | POA: Diagnosis not present

## 2020-11-05 DIAGNOSIS — F331 Major depressive disorder, recurrent, moderate: Secondary | ICD-10-CM | POA: Diagnosis not present

## 2020-11-15 DIAGNOSIS — F419 Anxiety disorder, unspecified: Secondary | ICD-10-CM | POA: Diagnosis not present

## 2020-11-15 DIAGNOSIS — F331 Major depressive disorder, recurrent, moderate: Secondary | ICD-10-CM | POA: Diagnosis not present

## 2020-11-25 DIAGNOSIS — F03918 Unspecified dementia, unspecified severity, with other behavioral disturbance: Secondary | ICD-10-CM | POA: Diagnosis not present

## 2020-11-25 DIAGNOSIS — F331 Major depressive disorder, recurrent, moderate: Secondary | ICD-10-CM | POA: Diagnosis not present

## 2020-11-25 DIAGNOSIS — F419 Anxiety disorder, unspecified: Secondary | ICD-10-CM | POA: Diagnosis not present

## 2020-12-07 DIAGNOSIS — F419 Anxiety disorder, unspecified: Secondary | ICD-10-CM | POA: Diagnosis not present

## 2020-12-07 DIAGNOSIS — F330 Major depressive disorder, recurrent, mild: Secondary | ICD-10-CM | POA: Diagnosis not present

## 2021-01-27 ENCOUNTER — Emergency Department (HOSPITAL_COMMUNITY)
Admission: EM | Admit: 2021-01-27 | Discharge: 2021-01-28 | Disposition: A | Discharge status: Home / Self Care | Payer: Medicare PPO | Attending: Emergency Medicine | Admitting: Emergency Medicine

## 2021-01-27 ENCOUNTER — Encounter (HOSPITAL_COMMUNITY): Payer: Self-pay

## 2021-01-27 ENCOUNTER — Emergency Department (HOSPITAL_COMMUNITY): Payer: Medicare PPO

## 2021-01-27 ENCOUNTER — Other Ambulatory Visit: Payer: Self-pay

## 2021-01-27 DIAGNOSIS — Z79899 Other long term (current) drug therapy: Secondary | ICD-10-CM | POA: Diagnosis not present

## 2021-01-27 DIAGNOSIS — R519 Headache, unspecified: Secondary | ICD-10-CM

## 2021-01-27 DIAGNOSIS — F0390 Unspecified dementia without behavioral disturbance: Secondary | ICD-10-CM | POA: Insufficient documentation

## 2021-01-27 DIAGNOSIS — I129 Hypertensive chronic kidney disease with stage 1 through stage 4 chronic kidney disease, or unspecified chronic kidney disease: Secondary | ICD-10-CM | POA: Diagnosis not present

## 2021-01-27 DIAGNOSIS — R1084 Generalized abdominal pain: Secondary | ICD-10-CM | POA: Insufficient documentation

## 2021-01-27 DIAGNOSIS — N189 Chronic kidney disease, unspecified: Secondary | ICD-10-CM | POA: Diagnosis not present

## 2021-01-27 DIAGNOSIS — Z85828 Personal history of other malignant neoplasm of skin: Secondary | ICD-10-CM | POA: Insufficient documentation

## 2021-01-27 DIAGNOSIS — Z7982 Long term (current) use of aspirin: Secondary | ICD-10-CM | POA: Insufficient documentation

## 2021-01-27 DIAGNOSIS — Z87891 Personal history of nicotine dependence: Secondary | ICD-10-CM | POA: Diagnosis not present

## 2021-01-27 DIAGNOSIS — Z20822 Contact with and (suspected) exposure to covid-19: Secondary | ICD-10-CM | POA: Insufficient documentation

## 2021-01-27 DIAGNOSIS — R0789 Other chest pain: Secondary | ICD-10-CM | POA: Diagnosis not present

## 2021-01-27 DIAGNOSIS — E041 Nontoxic single thyroid nodule: Secondary | ICD-10-CM | POA: Diagnosis not present

## 2021-01-27 DIAGNOSIS — E042 Nontoxic multinodular goiter: Secondary | ICD-10-CM

## 2021-01-27 LAB — BASIC METABOLIC PANEL
Anion gap: 7 (ref 5–15)
BUN: 14 mg/dL (ref 8–23)
CO2: 26 mmol/L (ref 22–32)
Calcium: 8.7 mg/dL — ABNORMAL LOW (ref 8.9–10.3)
Chloride: 106 mmol/L (ref 98–111)
Creatinine, Ser: 1.34 mg/dL — ABNORMAL HIGH (ref 0.44–1.00)
GFR, Estimated: 41 mL/min — ABNORMAL LOW (ref >60)
Glucose, Bld: 113 mg/dL — ABNORMAL HIGH (ref 70–99)
Potassium: 3.7 mmol/L (ref 3.5–5.1)
Sodium: 139 mmol/L (ref 135–145)

## 2021-01-27 LAB — HEPATIC FUNCTION PANEL
ALT: 14 U/L (ref 0–44)
AST: 15 U/L (ref 15–41)
Albumin: 3 g/dL — ABNORMAL LOW (ref 3.5–5.0)
Alkaline Phosphatase: 55 U/L (ref 38–126)
Bilirubin, Direct: 0.1 mg/dL (ref 0.0–0.2)
Indirect Bilirubin: 0.3 mg/dL (ref 0.3–0.9)
Total Bilirubin: 0.4 mg/dL (ref 0.3–1.2)
Total Protein: 5.6 g/dL — ABNORMAL LOW (ref 6.5–8.1)

## 2021-01-27 LAB — RESP PANEL BY RT-PCR (FLU A&B, COVID) ARPGX2
Influenza A by PCR: NEGATIVE
Influenza B by PCR: NEGATIVE
SARS Coronavirus 2 by RT PCR: NEGATIVE

## 2021-01-27 LAB — CBC
HCT: 49.7 % — ABNORMAL HIGH (ref 36.0–46.0)
Hemoglobin: 15.6 g/dL — ABNORMAL HIGH (ref 12.0–15.0)
MCH: 29.8 pg (ref 26.0–34.0)
MCHC: 31.4 g/dL (ref 30.0–36.0)
MCV: 95 fL (ref 80.0–100.0)
Platelets: 243 10*3/uL (ref 150–400)
RBC: 5.23 MIL/uL — ABNORMAL HIGH (ref 3.87–5.11)
RDW: 14.4 % (ref 11.5–15.5)
WBC: 7.3 10*3/uL (ref 4.0–10.5)
nRBC: 0 % (ref 0.0–0.2)

## 2021-01-27 LAB — PROTIME-INR
INR: 1 (ref 0.8–1.2)
Prothrombin Time: 13.3 seconds (ref 11.4–15.2)

## 2021-01-27 LAB — TROPONIN I (HIGH SENSITIVITY): Troponin I (High Sensitivity): 4 ng/L (ref <18)

## 2021-01-27 LAB — LIPASE, BLOOD: Lipase: 53 U/L — ABNORMAL HIGH (ref 11–51)

## 2021-01-27 MED ORDER — ACETAMINOPHEN 325 MG PO TABS
650.0000 mg | ORAL_TABLET | Freq: Once | ORAL | Status: AC
  Administered 2021-01-27: 650 mg via ORAL
  Filled 2021-01-27: qty 2

## 2021-01-27 MED ORDER — METOCLOPRAMIDE HCL 5 MG/ML IJ SOLN
5.0000 mg | Freq: Once | INTRAMUSCULAR | Status: AC
  Administered 2021-01-27: 5 mg via INTRAVENOUS
  Filled 2021-01-27: qty 2

## 2021-01-27 MED ORDER — AMLODIPINE BESYLATE 5 MG PO TABS
10.0000 mg | ORAL_TABLET | Freq: Once | ORAL | Status: AC
  Administered 2021-01-28: 10 mg via ORAL
  Filled 2021-01-27: qty 2

## 2021-01-27 MED ORDER — IOHEXOL 350 MG/ML SOLN
80.0000 mL | Freq: Once | INTRAVENOUS | Status: AC | PRN
  Administered 2021-01-27: 80 mL via INTRAVENOUS

## 2021-01-27 MED ORDER — SODIUM CHLORIDE 0.9 % IV BOLUS
1000.0000 mL | Freq: Once | INTRAVENOUS | Status: AC
  Administered 2021-01-27: 1000 mL via INTRAVENOUS

## 2021-01-27 MED ORDER — KETOROLAC TROMETHAMINE 15 MG/ML IJ SOLN
15.0000 mg | Freq: Once | INTRAMUSCULAR | Status: AC
  Administered 2021-01-27: 15 mg via INTRAVENOUS
  Filled 2021-01-27: qty 1

## 2021-01-27 MED ORDER — DIPHENHYDRAMINE HCL 50 MG/ML IJ SOLN
25.0000 mg | Freq: Once | INTRAMUSCULAR | Status: AC
  Administered 2021-01-27: 25 mg via INTRAVENOUS
  Filled 2021-01-27: qty 1

## 2021-01-27 NOTE — ED Provider Notes (Signed)
[facility] EMERGENCY DEPARTMENT Provider Note   CSN: [id number] Arrival date & time: 01/27/21  6967     History Chief Complaint  Patient presents with   Chest Pain    Jamie George is a 75 y.o. female. 75 year old female presents with a chief complaint of headache.  She states she gets daily headaches.  This headache is worse than typical but she states it is in the same area and the same type of headache but more severe.  Is been bothering her since yesterday.  She has had nausea but she denies vomiting.  She denies neck stiffness or pain.  No fevers.  The light hurts her eyes but no vision changes.  She denies any new weakness in her extremities.  She has had prior brain surgery and has a chronic defect at the top of her head that she states is unchanged.  When asked about other types of pain she endorses many other pains, including chest pain, abdominal pain, and shortness of breath.  All of this started yesterday.  She is asking for Tylenol.  Past Medical History:  Diagnosis Date   Cancer ([place])    skin   Complication of anesthesia    " difficult to wake me up "   Depression    Duodenitis    GERD (gastroesophageal reflux disease)    Hypertension    Insomnia    Migraines    Mild dementia     Patient Active Problem List   Diagnosis Date Noted   Pneumonia 11/29/2019   Dyspnea 07/30/2019   Dementia ([place]) 01/02/2018   Aggression 01/02/2018   Dissection of abdominal aorta ([place]) 08/13/2017   Chest pain 08/13/2017   Hypertension 08/13/2017   CKD (chronic kidney disease) 08/13/2017   Anemia 08/13/2017    Past Surgical History:  Procedure Laterality Date   ABDOMINAL HYSTERECTOMY     BACK SURGERY     BIOPSY  03/04/2018   Procedure: BIOPSY;  Surgeon: Otis Brace, MD;  Location: [facility];  Service: Gastroenterology;;   BRAIN SURGERY  2013   meningioma   ENTEROSCOPY N/A 03/04/2018   Procedure: PUSH ENTEROSCOPY;  Surgeon: Otis Brace,  MD;  Location: [facility];  Service: Gastroenterology;  Laterality: N/A;   SHOULDER SURGERY       OB History   No obstetric history on file.     Family History  Problem Relation Age of Onset   Cancer Mother    Stroke Father    Heart disease Father     Social History   Tobacco Use   Smoking status: Former   Smokeless tobacco: Former  Scientific laboratory technician Use: Never used  Substance Use Topics   Alcohol use: Not Currently   Drug use: Never    Home Medications Prior to Admission medications   Medication Sig Start Date End Date Taking? Authorizing Provider  acetaminophen (TYLENOL) 325 MG tablet Take 650 mg by mouth every 8 (eight) hours as needed (pain).   Yes [provider]  acetaminophen (TYLENOL) 500 MG tablet Take 1,000 mg by mouth 2 (two) times daily.   Yes [provider]  aspirin EC 81 MG EC tablet Take 1 tablet (81 mg total) by mouth daily. With food 08/15/17  Yes Emokpae, Courage, MD  atorvastatin (LIPITOR) 40 MG tablet Take 40 mg by mouth 2 (two) times a week. on Thurs & Sun   Yes [provider]  docusate sodium (COLACE) 100 MG capsule Take 100 mg  by mouth daily.   Yes [provider]  donepezil (ARICEPT) 10 MG tablet Take 10 mg by mouth at bedtime.   Yes [provider]  escitalopram (LEXAPRO) 20 MG tablet Take 20 mg by mouth daily.  05/03/17  Yes [provider]  ferrous sulfate 325 (65 FE) MG tablet Take 325 mg by mouth 2 (two) times daily with a meal.   Yes [provider]  fluticasone (FLONASE) 50 MCG/ACT nasal spray Place 1 spray into both nostrils daily.   Yes [provider]  Melatonin 10 MG TABS Take 10 mg by mouth at bedtime.   Yes [provider]  memantine (NAMENDA) 10 MG tablet Take 1 tablet (10 mg total) by mouth 2 (two) times daily. 12/17/17  Yes Penumalli, Earlean Polka, MD  mirtazapine (REMERON) 7.5 MG tablet Take 7.5 mg by mouth at bedtime.   Yes [provider]   pantoprazole (PROTONIX) 40 MG tablet Take 40 mg by mouth 2 (two) times daily.    Yes [provider]  polyethylene glycol (MIRALAX / GLYCOLAX) packet Take 17 g by mouth daily.   Yes [provider]  risperiDONE (RISPERDAL) 0.5 MG tablet Take 1 mg by mouth at bedtime.    Yes [provider]  traZODone (DESYREL) 50 MG tablet Take 50 mg by mouth at bedtime. 05/03/17  Yes [provider]  verapamil (CALAN) 120 MG tablet Take 120 mg by mouth daily.  11/09/19  Yes [provider]  zonisamide (ZONEGRAN) 100 MG capsule Take 200 mg by mouth at bedtime.    Yes [provider]  amoxicillin-clavulanate (AUGMENTIN) 500-125 MG tablet Take 1 tablet (500 mg total) by mouth every 8 (eight) hours. Patient not taking: Reported on 01/27/2021 06/19/20   Varney Biles, MD    Allergies    Patient has no known allergies.  Review of Systems   Review of Systems  Constitutional:  Negative for fever.  Eyes:  Positive for photophobia. Negative for visual disturbance.  Respiratory:  Positive for cough and shortness of breath.   Cardiovascular:  Positive for chest pain.  Gastrointestinal:  Positive for abdominal pain and nausea. Negative for vomiting.  Neurological:  Positive for headaches. Negative for weakness.  All other systems reviewed and are negative.  Physical Exam Updated Vital Signs BP 129/80   Pulse 62   Temp 98.1 F (36.7 C) (Oral)   Resp 12   Ht 5\' 6"  (1.676 m)   Wt 69.9 kg   SpO2 100%   BMI 24.86 kg/m   Physical Exam Vitals and nursing note reviewed.  Constitutional:      Appearance: She is well-developed.  HENT:     Head: Normocephalic.      Right Ear: External ear normal.     Left Ear: External ear normal.     Nose: Nose normal.  Eyes:     General:        Right eye: No discharge.        Left eye: No discharge.     Extraocular Movements: Extraocular movements intact.     Pupils: Pupils are equal, round, and reactive to light.   Cardiovascular:     Rate and Rhythm: Normal rate and regular rhythm.     Heart sounds: Normal heart sounds.  Pulmonary:     Effort: Pulmonary effort is normal.     Breath sounds: Normal breath sounds.  Abdominal:     Palpations: Abdomen is soft.     Tenderness: There is  generalized abdominal tenderness (worst in upper abdomen).  Musculoskeletal:     Cervical back: Normal range of motion and neck supple.  Skin:    General: Skin is warm and dry.  Neurological:     Mental Status: She is alert and oriented to person, place, and time.     Comments: CN 3-12 grossly intact. 5/5 strength in both extremities.  Lower extremity testing is limited as she freely moves her lower extremities but when asked for specific strength testing she gives very poor effort and states that she is tired.  Grossly normal sensation. Normal finger to nose.   Psychiatric:        Mood and Affect: Mood is not anxious.    ED Results / Procedures / Treatments   Labs (all labs ordered are listed, but only abnormal results are displayed) Labs Reviewed  BASIC METABOLIC PANEL - Abnormal; Notable for the following components:      Result Value   Glucose, Bld 113 (*)    Creatinine, Ser 1.34 (*)    Calcium 8.7 (*)    GFR, Estimated 41 (*)    All other components within normal limits  CBC - Abnormal; Notable for the following components:   RBC 5.23 (*)    Hemoglobin 15.6 (*)    HCT 49.7 (*)    All other components within normal limits  HEPATIC FUNCTION PANEL - Abnormal; Notable for the following components:   Total Protein 5.6 (*)    Albumin 3.0 (*)    All other components within normal limits  LIPASE, BLOOD - Abnormal; Notable for the following components:   Lipase 53 (*)    All other components within normal limits  RESP PANEL BY RT-PCR (FLU A&B, COVID) [id number]  PROTIME-INR  URINALYSIS, ROUTINE W REFLEX MICROSCOPIC  TROPONIN I (HIGH SENSITIVITY)    EKG EKG Interpretation  Date/Time:  Thursday January 27 2021 09:50:36 EST Ventricular Rate:  72 PR Interval:  145 QRS Duration: 76 QT Interval:  417 QTC Calculation: 457 R Axis:   69 Text Interpretation: Sinus rhythm Low voltage, extremity and precordial leads Baseline wander in lead(s) V3 nonspecific T waves similar to Oct 2021 Confirmed by Sherwood Gambler [phone]) on 01/27/2021 9:54:41 AM  Radiology DG Chest 2 View  Result Date: 01/27/2021 CLINICAL DATA:  Chest pain, shortness of breath EXAM: CHEST - 2 VIEW COMPARISON:  07/30/2019 FINDINGS: Cardiac size is unremarkable. There are no signs of pulmonary edema or new focal infiltrates. Left lateral CP angle is indistinct. There is no pneumothorax. IMPRESSION: There are no signs of pulmonary edema or focal pulmonary consolidation. Electronically Signed   By: Elmer Picker M.D.   On: 01/27/2021 10:52   CT Head Wo Contrast  Result Date: 01/27/2021 CLINICAL DATA:  Headaches EXAM: CT HEAD WITHOUT CONTRAST TECHNIQUE: Contiguous axial images were obtained from the base of the skull through the vertex without intravenous contrast. COMPARISON:  07/10/2020 FINDINGS: Brain: No acute intracranial findings are seen. Ventricles are not dilated. Cortical sulci are prominent. There is old lacunar infarct in the right basal ganglia. There is decreased density in periventricular white matter. Vascular: Unremarkable. Skull: There is previous left frontal craniotomy. Sinuses/Orbits: Unremarkable. Other: No significant changes are noted. IMPRESSION: No acute intracranial findings are seen in noncontrast CT brain. Electronically Signed   By: Elmer Picker M.D.   On: 01/27/2021 13:29   CT Angio Chest/Abd/Pel for Dissection W and/or Wo Contrast  Result Date: 01/27/2021 CLINICAL DATA:  75 year old female with chest and abdominal  pain. EXAM: CT ANGIOGRAPHY CHEST, ABDOMEN AND PELVIS TECHNIQUE: Non-contrast CT of the chest was initially obtained. Multidetector CT imaging through the chest, abdomen and pelvis was  performed using the standard protocol during bolus administration of intravenous contrast. Multiplanar reconstructed images and MIPs were obtained and reviewed to evaluate the vascular anatomy. CONTRAST:  32mL OMNIPAQUE IOHEXOL 350 MG/ML SOLN COMPARISON:  Chest CT from 11/29/2019, CT abdomen pelvis from 02/23/2018 FINDINGS: CTA CHEST FINDINGS VASCULAR Preferential opacification of the thoracic aorta. No evidence of thoracic aortic aneurysm or dissection. Scattered atherosclerotic calcifications. No evidence of central pulmonary embolism. Normal heart size. No pericardial effusion. Review of the MIP images confirms the above findings. NON VASCULAR Mediastinum/Nodes: Multifocal hypoattenuating thyroid nodules, the largest in the left thyroid measuring up to approximately 2 cm. The visualized trachea and esophagus are within normal limits. No axillary, mediastinal, or hilar lymphadenopathy. Lungs/Pleura: Bibasilar subsegmental atelectasis. No pleural effusion or pneumothorax. Musculoskeletal: No chest wall abnormality. No acute or significant osseous findings. CTA ABDOMEN AND PELVIS FINDINGS VASCULAR Aorta: Normal in caliber and patent throughout. Scattered prominent fibrofatty and calcific atherosclerotic changes, most prominent in the infrarenal portion. Celiac: Patent without evidence of aneurysm, dissection, vasculitis or significant stenosis. Replaced left hepatic artery from the left gastric artery is noted. SMA: Patent without evidence of aneurysm, dissection, vasculitis or significant stenosis. Renals: Dual bilateral renal arteries are patent without evidence of aneurysm, dissection, vasculitis, fibromuscular dysplasia or significant stenosis. IMA: Patent without evidence of aneurysm, dissection, vasculitis or significant stenosis. Inflow: Patent without evidence of aneurysm, dissection, vasculitis or significant stenosis. Veins: No obvious venous abnormality within the limitations of this arterial phase study.  Review of the MIP images confirms the above findings. NON-VASCULAR Hepatobiliary: No focal liver abnormality is seen. No gallstones, gallbladder wall thickening, or biliary dilatation. Pancreas: Unremarkable. No pancreatic ductal dilatation or surrounding inflammatory changes. Spleen: Normal in size without focal abnormality. Adrenals/Urinary Tract: Adrenal glands are unremarkable. Diffuse bilateral cortical thinning without renal calculi, focal lesion, or hydronephrosis. Bladder is unremarkable. Stomach/Bowel: Stomach is within normal limits. Appendix is not definitively identified. Sigmoid diverticula without surrounding inflammatory changes. No evidence of bowel wall thickening, distention, or inflammatory changes. Lymphatic: No abdominopelvic lymphadenopathy. Reproductive: Status post hysterectomy. No adnexal masses. Other: No abdominal wall hernia or abnormality. No abdominopelvic ascites. Musculoskeletal: Multilevel degenerative changes of the lumbar spine. No acute osseous abnormality. IMPRESSION: VASCULAR 1. No evidence of acute aortic syndrome. 2.  Aortic Atherosclerosis (ICD10-I70.0). NON VASCULAR 1. No acute intrathoracic or abdominopelvic abnormality to explain chest and abdominal pain. 2. Bibasilar subsegmental atelectasis. 3. Diverticulosis without evidence of diverticulitis. 4. Multiple bilateral thyroid nodules, the largest measuring up to 2 cm in the left thyroid. Recommend nonemergent thyroid US further characterization. (Ref: J Am Coll Radiol. 2015 Feb;12(2): 143-50). Ruthann Cancer, MD [facility] Radiology Electronically Signed   By: Ruthann Cancer M.D.   On: 01/27/2021 13:49    Procedures Procedures   Medications Ordered in ED Medications  acetaminophen (TYLENOL) tablet 650 mg (650 mg Oral Given 01/27/21 1053)  metoCLOPramide (REGLAN) injection 5 mg (5 mg Intravenous Given 01/27/21 1053)  diphenhydrAMINE (BENADRYL) injection 25 mg (25 mg  Intravenous Given 01/27/21 1053)  sodium chloride 0.9 % bolus 1,000 mL (0 mLs Intravenous Stopped 01/27/21 1301)  iohexol (OMNIPAQUE) 350 MG/ML injection 80 mL (80 mLs Intravenous Contrast Given 01/27/21 1329)  ketorolac (TORADOL) 15 MG/ML injection 15 mg (15 mg Intravenous Given 01/27/21 1423)    ED Course  I have reviewed the triage  vital signs and the nursing notes.  Pertinent labs & imaging results that were available during my care of the patient were reviewed by me and considered in my medical decision making (see chart for details).    MDM Rules/Calculators/A&P                           Patient's work-up shows no obvious acute emergencies.  I doubt infection as a cause of her headache as she has had a headache daily for quite some time.  She does not have signs of meningitis on exam.  Her WBC is normal.  Other labs are benign compared to baseline.  Troponin is normal after her symptoms started over the last day or so.  I doubt ACS or the need for a second troponin.  She does have a documented history of dissection of the abdominal aorta so a CTA was obtained but is relatively unremarkable for acute findings.  At this point, she is resting comfortably and I think stable for discharge home. Will advise outpatient ultrasound for thyroid nodules.  Final Clinical Impression(s) / ED Diagnoses Final diagnoses:  Bad headache  Multiple thyroid nodules    Rx / DC Orders ED Discharge Orders     None        Sherwood Gambler, MD 01/27/21 1512

## 2021-01-27 NOTE — Discharge Instructions (Addendum)
If you develop continued, recurrent, or worsening headache, fever, neck stiffness, vomiting, blurry or double vision, weakness or numbness in your arms or legs, trouble speaking, or any other new/concerning symptoms then return to the ER for evaluation.   Your CT scan showed multiple nodules of your thyroid gland and so you will need an outpatient ultrasound to further evaluate this.

## 2021-01-27 NOTE — ED Provider Notes (Signed)
Pt was seen by Dr Regenia Skeeter earlier today.  Pt has been waiting for discharge.  Notified by staff that bp is now elevated 200s.  Pt did have a  ct of the head earlier today.  Will give a dose of BP meds.  Will have BP rechecked after med administration to assess response.   Dorie Rank, MD 01/27/21 [phone]

## 2021-01-27 NOTE — ED Notes (Signed)
[facility] called unable to give pick up time 

## 2021-01-27 NOTE — ED Notes (Addendum)
SpO2 88% on RA. Changed pulse oximeter chord. SpO2 100% on RA

## 2021-01-27 NOTE — ED Notes (Signed)
Report given to Anderson Malta, Therapist, sports at [facility]

## 2021-01-27 NOTE — ED Triage Notes (Signed)
Pt bib [facility] from [facility] with complaints of central chest pain, lower abdominal pain and a frontal headache. Pt says that her pain started yesterday. En route EMS gave pt 324mg  ASA, 2 nitro, and 4mg  zofran. Per facility pt is at baseline and is AOx4 EMS vitals: 152/82, 76 HR NSR, 16R, 97.9T, 98%RA

## 2021-02-28 ENCOUNTER — Encounter (HOSPITAL_COMMUNITY): Payer: Self-pay | Admitting: Radiology

## 2021-03-28 DIAGNOSIS — F331 Major depressive disorder, recurrent, moderate: Secondary | ICD-10-CM | POA: Diagnosis not present

## 2021-03-28 DIAGNOSIS — F419 Anxiety disorder, unspecified: Secondary | ICD-10-CM | POA: Diagnosis not present

## 2021-04-15 DIAGNOSIS — F419 Anxiety disorder, unspecified: Secondary | ICD-10-CM | POA: Diagnosis not present

## 2021-04-15 DIAGNOSIS — F330 Major depressive disorder, recurrent, mild: Secondary | ICD-10-CM | POA: Diagnosis not present

## 2021-05-25 DIAGNOSIS — F419 Anxiety disorder, unspecified: Secondary | ICD-10-CM | POA: Diagnosis not present

## 2021-05-25 DIAGNOSIS — F330 Major depressive disorder, recurrent, mild: Secondary | ICD-10-CM | POA: Diagnosis not present

## 2021-06-22 ENCOUNTER — Ambulatory Visit: Payer: Medicare PPO | Admitting: Family

## 2021-06-22 NOTE — Telephone Encounter (Signed)
Forwarded message to [employer].  ?[facility] desk is calling patient to confirm they are wanting to establish with our practice. Appointment is scheduled for 06/22/2021 through myChart.  ?

## 2021-07-09 DIAGNOSIS — F0390 Unspecified dementia without behavioral disturbance: Secondary | ICD-10-CM | POA: Diagnosis not present

## 2021-07-09 DIAGNOSIS — R2689 Other abnormalities of gait and mobility: Secondary | ICD-10-CM | POA: Diagnosis not present

## 2021-07-09 DIAGNOSIS — M6281 Muscle weakness (generalized): Secondary | ICD-10-CM | POA: Diagnosis not present

## 2021-07-09 DIAGNOSIS — R262 Difficulty in walking, not elsewhere classified: Secondary | ICD-10-CM | POA: Diagnosis not present

## 2021-07-09 DIAGNOSIS — M6259 Muscle wasting and atrophy, not elsewhere classified, multiple sites: Secondary | ICD-10-CM | POA: Diagnosis not present

## 2021-07-11 ENCOUNTER — Ambulatory Visit: Payer: Medicare PPO | Admitting: Family

## 2021-07-11 DIAGNOSIS — M6281 Muscle weakness (generalized): Secondary | ICD-10-CM | POA: Diagnosis not present

## 2021-07-11 DIAGNOSIS — F39 Unspecified mood [affective] disorder: Secondary | ICD-10-CM | POA: Diagnosis not present

## 2021-07-11 DIAGNOSIS — Z8719 Personal history of other diseases of the digestive system: Secondary | ICD-10-CM | POA: Diagnosis not present

## 2021-07-11 DIAGNOSIS — F0390 Unspecified dementia without behavioral disturbance: Secondary | ICD-10-CM | POA: Diagnosis not present

## 2021-07-11 DIAGNOSIS — Z8616 Personal history of COVID-19: Secondary | ICD-10-CM | POA: Diagnosis not present

## 2021-07-11 DIAGNOSIS — E785 Hyperlipidemia, unspecified: Secondary | ICD-10-CM | POA: Diagnosis not present

## 2021-07-11 DIAGNOSIS — R262 Difficulty in walking, not elsewhere classified: Secondary | ICD-10-CM | POA: Diagnosis not present

## 2021-07-11 DIAGNOSIS — M6259 Muscle wasting and atrophy, not elsewhere classified, multiple sites: Secondary | ICD-10-CM | POA: Diagnosis not present

## 2021-07-11 DIAGNOSIS — R2689 Other abnormalities of gait and mobility: Secondary | ICD-10-CM | POA: Diagnosis not present

## 2021-07-11 DIAGNOSIS — I10 Essential (primary) hypertension: Secondary | ICD-10-CM | POA: Diagnosis not present

## 2021-07-11 DIAGNOSIS — R5381 Other malaise: Secondary | ICD-10-CM | POA: Diagnosis not present

## 2021-07-11 DIAGNOSIS — D649 Anemia, unspecified: Secondary | ICD-10-CM | POA: Diagnosis not present

## 2021-07-12 DIAGNOSIS — F0390 Unspecified dementia without behavioral disturbance: Secondary | ICD-10-CM | POA: Diagnosis not present

## 2021-07-12 DIAGNOSIS — R2689 Other abnormalities of gait and mobility: Secondary | ICD-10-CM | POA: Diagnosis not present

## 2021-07-12 DIAGNOSIS — M6259 Muscle wasting and atrophy, not elsewhere classified, multiple sites: Secondary | ICD-10-CM | POA: Diagnosis not present

## 2021-07-12 DIAGNOSIS — R262 Difficulty in walking, not elsewhere classified: Secondary | ICD-10-CM | POA: Diagnosis not present

## 2021-07-12 DIAGNOSIS — M6281 Muscle weakness (generalized): Secondary | ICD-10-CM | POA: Diagnosis not present

## 2021-07-13 DIAGNOSIS — E785 Hyperlipidemia, unspecified: Secondary | ICD-10-CM | POA: Diagnosis not present

## 2021-07-13 DIAGNOSIS — E559 Vitamin D deficiency, unspecified: Secondary | ICD-10-CM | POA: Diagnosis not present

## 2021-07-13 DIAGNOSIS — F0390 Unspecified dementia without behavioral disturbance: Secondary | ICD-10-CM | POA: Diagnosis not present

## 2021-07-13 DIAGNOSIS — I10 Essential (primary) hypertension: Secondary | ICD-10-CM | POA: Diagnosis not present

## 2021-07-13 DIAGNOSIS — E119 Type 2 diabetes mellitus without complications: Secondary | ICD-10-CM | POA: Diagnosis not present

## 2021-07-13 DIAGNOSIS — D649 Anemia, unspecified: Secondary | ICD-10-CM | POA: Diagnosis not present

## 2021-07-13 DIAGNOSIS — R2689 Other abnormalities of gait and mobility: Secondary | ICD-10-CM | POA: Diagnosis not present

## 2021-07-13 DIAGNOSIS — M6281 Muscle weakness (generalized): Secondary | ICD-10-CM | POA: Diagnosis not present

## 2021-07-13 DIAGNOSIS — M6259 Muscle wasting and atrophy, not elsewhere classified, multiple sites: Secondary | ICD-10-CM | POA: Diagnosis not present

## 2021-07-13 DIAGNOSIS — D519 Vitamin B12 deficiency anemia, unspecified: Secondary | ICD-10-CM | POA: Diagnosis not present

## 2021-07-13 DIAGNOSIS — R262 Difficulty in walking, not elsewhere classified: Secondary | ICD-10-CM | POA: Diagnosis not present

## 2021-07-13 DIAGNOSIS — D509 Iron deficiency anemia, unspecified: Secondary | ICD-10-CM | POA: Diagnosis not present

## 2021-07-14 DIAGNOSIS — R262 Difficulty in walking, not elsewhere classified: Secondary | ICD-10-CM | POA: Diagnosis not present

## 2021-07-14 DIAGNOSIS — M6281 Muscle weakness (generalized): Secondary | ICD-10-CM | POA: Diagnosis not present

## 2021-07-14 DIAGNOSIS — M6259 Muscle wasting and atrophy, not elsewhere classified, multiple sites: Secondary | ICD-10-CM | POA: Diagnosis not present

## 2021-07-14 DIAGNOSIS — R2689 Other abnormalities of gait and mobility: Secondary | ICD-10-CM | POA: Diagnosis not present

## 2021-07-14 DIAGNOSIS — F0390 Unspecified dementia without behavioral disturbance: Secondary | ICD-10-CM | POA: Diagnosis not present

## 2021-07-15 DIAGNOSIS — R0902 Hypoxemia: Secondary | ICD-10-CM | POA: Diagnosis not present

## 2021-07-15 DIAGNOSIS — R262 Difficulty in walking, not elsewhere classified: Secondary | ICD-10-CM | POA: Diagnosis not present

## 2021-07-15 DIAGNOSIS — R2689 Other abnormalities of gait and mobility: Secondary | ICD-10-CM | POA: Diagnosis not present

## 2021-07-15 DIAGNOSIS — M6281 Muscle weakness (generalized): Secondary | ICD-10-CM | POA: Diagnosis not present

## 2021-07-15 DIAGNOSIS — F0390 Unspecified dementia without behavioral disturbance: Secondary | ICD-10-CM | POA: Diagnosis not present

## 2021-07-15 DIAGNOSIS — M6259 Muscle wasting and atrophy, not elsewhere classified, multiple sites: Secondary | ICD-10-CM | POA: Diagnosis not present

## 2021-07-18 DIAGNOSIS — M6281 Muscle weakness (generalized): Secondary | ICD-10-CM | POA: Diagnosis not present

## 2021-07-18 DIAGNOSIS — R2689 Other abnormalities of gait and mobility: Secondary | ICD-10-CM | POA: Diagnosis not present

## 2021-07-18 DIAGNOSIS — F0390 Unspecified dementia without behavioral disturbance: Secondary | ICD-10-CM | POA: Diagnosis not present

## 2021-07-18 DIAGNOSIS — M6259 Muscle wasting and atrophy, not elsewhere classified, multiple sites: Secondary | ICD-10-CM | POA: Diagnosis not present

## 2021-07-18 DIAGNOSIS — R262 Difficulty in walking, not elsewhere classified: Secondary | ICD-10-CM | POA: Diagnosis not present

## 2021-07-19 DIAGNOSIS — F0390 Unspecified dementia without behavioral disturbance: Secondary | ICD-10-CM | POA: Diagnosis not present

## 2021-07-19 DIAGNOSIS — R262 Difficulty in walking, not elsewhere classified: Secondary | ICD-10-CM | POA: Diagnosis not present

## 2021-07-19 DIAGNOSIS — R2689 Other abnormalities of gait and mobility: Secondary | ICD-10-CM | POA: Diagnosis not present

## 2021-07-19 DIAGNOSIS — M6281 Muscle weakness (generalized): Secondary | ICD-10-CM | POA: Diagnosis not present

## 2021-07-19 DIAGNOSIS — M6259 Muscle wasting and atrophy, not elsewhere classified, multiple sites: Secondary | ICD-10-CM | POA: Diagnosis not present

## 2021-07-20 DIAGNOSIS — M6281 Muscle weakness (generalized): Secondary | ICD-10-CM | POA: Diagnosis not present

## 2021-07-20 DIAGNOSIS — R262 Difficulty in walking, not elsewhere classified: Secondary | ICD-10-CM | POA: Diagnosis not present

## 2021-07-20 DIAGNOSIS — F0390 Unspecified dementia without behavioral disturbance: Secondary | ICD-10-CM | POA: Diagnosis not present

## 2021-07-20 DIAGNOSIS — R2689 Other abnormalities of gait and mobility: Secondary | ICD-10-CM | POA: Diagnosis not present

## 2021-07-20 DIAGNOSIS — M6259 Muscle wasting and atrophy, not elsewhere classified, multiple sites: Secondary | ICD-10-CM | POA: Diagnosis not present

## 2021-07-21 DIAGNOSIS — F0390 Unspecified dementia without behavioral disturbance: Secondary | ICD-10-CM | POA: Diagnosis not present

## 2021-07-21 DIAGNOSIS — M6259 Muscle wasting and atrophy, not elsewhere classified, multiple sites: Secondary | ICD-10-CM | POA: Diagnosis not present

## 2021-07-21 DIAGNOSIS — R262 Difficulty in walking, not elsewhere classified: Secondary | ICD-10-CM | POA: Diagnosis not present

## 2021-07-21 DIAGNOSIS — R2689 Other abnormalities of gait and mobility: Secondary | ICD-10-CM | POA: Diagnosis not present

## 2021-07-21 DIAGNOSIS — M6281 Muscle weakness (generalized): Secondary | ICD-10-CM | POA: Diagnosis not present

## 2021-07-22 DIAGNOSIS — R2689 Other abnormalities of gait and mobility: Secondary | ICD-10-CM | POA: Diagnosis not present

## 2021-07-22 DIAGNOSIS — R262 Difficulty in walking, not elsewhere classified: Secondary | ICD-10-CM | POA: Diagnosis not present

## 2021-07-22 DIAGNOSIS — F0390 Unspecified dementia without behavioral disturbance: Secondary | ICD-10-CM | POA: Diagnosis not present

## 2021-07-22 DIAGNOSIS — M6259 Muscle wasting and atrophy, not elsewhere classified, multiple sites: Secondary | ICD-10-CM | POA: Diagnosis not present

## 2021-07-22 DIAGNOSIS — M6281 Muscle weakness (generalized): Secondary | ICD-10-CM | POA: Diagnosis not present

## 2021-07-25 DIAGNOSIS — M6281 Muscle weakness (generalized): Secondary | ICD-10-CM | POA: Diagnosis not present

## 2021-07-25 DIAGNOSIS — F0390 Unspecified dementia without behavioral disturbance: Secondary | ICD-10-CM | POA: Diagnosis not present

## 2021-07-25 DIAGNOSIS — M6259 Muscle wasting and atrophy, not elsewhere classified, multiple sites: Secondary | ICD-10-CM | POA: Diagnosis not present

## 2021-07-25 DIAGNOSIS — R2689 Other abnormalities of gait and mobility: Secondary | ICD-10-CM | POA: Diagnosis not present

## 2021-07-25 DIAGNOSIS — R262 Difficulty in walking, not elsewhere classified: Secondary | ICD-10-CM | POA: Diagnosis not present

## 2021-07-26 DIAGNOSIS — M6281 Muscle weakness (generalized): Secondary | ICD-10-CM | POA: Diagnosis not present

## 2021-07-26 DIAGNOSIS — R262 Difficulty in walking, not elsewhere classified: Secondary | ICD-10-CM | POA: Diagnosis not present

## 2021-07-26 DIAGNOSIS — M6259 Muscle wasting and atrophy, not elsewhere classified, multiple sites: Secondary | ICD-10-CM | POA: Diagnosis not present

## 2021-07-26 DIAGNOSIS — R2689 Other abnormalities of gait and mobility: Secondary | ICD-10-CM | POA: Diagnosis not present

## 2021-07-26 DIAGNOSIS — F0390 Unspecified dementia without behavioral disturbance: Secondary | ICD-10-CM | POA: Diagnosis not present

## 2021-07-27 DIAGNOSIS — M6281 Muscle weakness (generalized): Secondary | ICD-10-CM | POA: Diagnosis not present

## 2021-07-27 DIAGNOSIS — F0390 Unspecified dementia without behavioral disturbance: Secondary | ICD-10-CM | POA: Diagnosis not present

## 2021-07-27 DIAGNOSIS — R262 Difficulty in walking, not elsewhere classified: Secondary | ICD-10-CM | POA: Diagnosis not present

## 2021-07-27 DIAGNOSIS — M6259 Muscle wasting and atrophy, not elsewhere classified, multiple sites: Secondary | ICD-10-CM | POA: Diagnosis not present

## 2021-07-27 DIAGNOSIS — R2689 Other abnormalities of gait and mobility: Secondary | ICD-10-CM | POA: Diagnosis not present

## 2021-07-28 DIAGNOSIS — M6259 Muscle wasting and atrophy, not elsewhere classified, multiple sites: Secondary | ICD-10-CM | POA: Diagnosis not present

## 2021-07-28 DIAGNOSIS — M6281 Muscle weakness (generalized): Secondary | ICD-10-CM | POA: Diagnosis not present

## 2021-07-28 DIAGNOSIS — R262 Difficulty in walking, not elsewhere classified: Secondary | ICD-10-CM | POA: Diagnosis not present

## 2021-07-28 DIAGNOSIS — R2689 Other abnormalities of gait and mobility: Secondary | ICD-10-CM | POA: Diagnosis not present

## 2021-07-28 DIAGNOSIS — F0390 Unspecified dementia without behavioral disturbance: Secondary | ICD-10-CM | POA: Diagnosis not present

## 2021-07-29 DIAGNOSIS — R262 Difficulty in walking, not elsewhere classified: Secondary | ICD-10-CM | POA: Diagnosis not present

## 2021-07-29 DIAGNOSIS — R2689 Other abnormalities of gait and mobility: Secondary | ICD-10-CM | POA: Diagnosis not present

## 2021-07-29 DIAGNOSIS — M6259 Muscle wasting and atrophy, not elsewhere classified, multiple sites: Secondary | ICD-10-CM | POA: Diagnosis not present

## 2021-07-29 DIAGNOSIS — F0390 Unspecified dementia without behavioral disturbance: Secondary | ICD-10-CM | POA: Diagnosis not present

## 2021-07-29 DIAGNOSIS — M6281 Muscle weakness (generalized): Secondary | ICD-10-CM | POA: Diagnosis not present

## 2021-08-01 DIAGNOSIS — F0390 Unspecified dementia without behavioral disturbance: Secondary | ICD-10-CM | POA: Diagnosis not present

## 2021-08-01 DIAGNOSIS — M6281 Muscle weakness (generalized): Secondary | ICD-10-CM | POA: Diagnosis not present

## 2021-08-01 DIAGNOSIS — M6259 Muscle wasting and atrophy, not elsewhere classified, multiple sites: Secondary | ICD-10-CM | POA: Diagnosis not present

## 2021-08-01 DIAGNOSIS — R262 Difficulty in walking, not elsewhere classified: Secondary | ICD-10-CM | POA: Diagnosis not present

## 2021-08-01 DIAGNOSIS — R2689 Other abnormalities of gait and mobility: Secondary | ICD-10-CM | POA: Diagnosis not present

## 2021-08-02 DIAGNOSIS — F0390 Unspecified dementia without behavioral disturbance: Secondary | ICD-10-CM | POA: Diagnosis not present

## 2021-08-02 DIAGNOSIS — R262 Difficulty in walking, not elsewhere classified: Secondary | ICD-10-CM | POA: Diagnosis not present

## 2021-08-02 DIAGNOSIS — R2689 Other abnormalities of gait and mobility: Secondary | ICD-10-CM | POA: Diagnosis not present

## 2021-08-02 DIAGNOSIS — M6259 Muscle wasting and atrophy, not elsewhere classified, multiple sites: Secondary | ICD-10-CM | POA: Diagnosis not present

## 2021-08-02 DIAGNOSIS — M6281 Muscle weakness (generalized): Secondary | ICD-10-CM | POA: Diagnosis not present

## 2021-08-03 DIAGNOSIS — F0390 Unspecified dementia without behavioral disturbance: Secondary | ICD-10-CM | POA: Diagnosis not present

## 2021-08-03 DIAGNOSIS — M6281 Muscle weakness (generalized): Secondary | ICD-10-CM | POA: Diagnosis not present

## 2021-08-03 DIAGNOSIS — R262 Difficulty in walking, not elsewhere classified: Secondary | ICD-10-CM | POA: Diagnosis not present

## 2021-08-03 DIAGNOSIS — M6259 Muscle wasting and atrophy, not elsewhere classified, multiple sites: Secondary | ICD-10-CM | POA: Diagnosis not present

## 2021-08-03 DIAGNOSIS — R2689 Other abnormalities of gait and mobility: Secondary | ICD-10-CM | POA: Diagnosis not present

## 2021-08-04 DIAGNOSIS — R2689 Other abnormalities of gait and mobility: Secondary | ICD-10-CM | POA: Diagnosis not present

## 2021-08-04 DIAGNOSIS — M6281 Muscle weakness (generalized): Secondary | ICD-10-CM | POA: Diagnosis not present

## 2021-08-04 DIAGNOSIS — R262 Difficulty in walking, not elsewhere classified: Secondary | ICD-10-CM | POA: Diagnosis not present

## 2021-08-04 DIAGNOSIS — F0390 Unspecified dementia without behavioral disturbance: Secondary | ICD-10-CM | POA: Diagnosis not present

## 2021-08-04 DIAGNOSIS — F3341 Major depressive disorder, recurrent, in partial remission: Secondary | ICD-10-CM | POA: Diagnosis not present

## 2021-08-04 DIAGNOSIS — M6259 Muscle wasting and atrophy, not elsewhere classified, multiple sites: Secondary | ICD-10-CM | POA: Diagnosis not present

## 2021-08-05 DIAGNOSIS — M6259 Muscle wasting and atrophy, not elsewhere classified, multiple sites: Secondary | ICD-10-CM | POA: Diagnosis not present

## 2021-08-05 DIAGNOSIS — M6281 Muscle weakness (generalized): Secondary | ICD-10-CM | POA: Diagnosis not present

## 2021-08-05 DIAGNOSIS — R2689 Other abnormalities of gait and mobility: Secondary | ICD-10-CM | POA: Diagnosis not present

## 2021-08-05 DIAGNOSIS — R262 Difficulty in walking, not elsewhere classified: Secondary | ICD-10-CM | POA: Diagnosis not present

## 2021-08-05 DIAGNOSIS — F0390 Unspecified dementia without behavioral disturbance: Secondary | ICD-10-CM | POA: Diagnosis not present

## 2021-08-08 DIAGNOSIS — M6281 Muscle weakness (generalized): Secondary | ICD-10-CM | POA: Diagnosis not present

## 2021-08-08 DIAGNOSIS — F0390 Unspecified dementia without behavioral disturbance: Secondary | ICD-10-CM | POA: Diagnosis not present

## 2021-08-08 DIAGNOSIS — R262 Difficulty in walking, not elsewhere classified: Secondary | ICD-10-CM | POA: Diagnosis not present

## 2021-08-08 DIAGNOSIS — M6259 Muscle wasting and atrophy, not elsewhere classified, multiple sites: Secondary | ICD-10-CM | POA: Diagnosis not present

## 2021-08-08 DIAGNOSIS — R2689 Other abnormalities of gait and mobility: Secondary | ICD-10-CM | POA: Diagnosis not present

## 2021-08-09 DIAGNOSIS — R2689 Other abnormalities of gait and mobility: Secondary | ICD-10-CM | POA: Diagnosis not present

## 2021-08-09 DIAGNOSIS — R262 Difficulty in walking, not elsewhere classified: Secondary | ICD-10-CM | POA: Diagnosis not present

## 2021-08-09 DIAGNOSIS — M6259 Muscle wasting and atrophy, not elsewhere classified, multiple sites: Secondary | ICD-10-CM | POA: Diagnosis not present

## 2021-08-09 DIAGNOSIS — F0390 Unspecified dementia without behavioral disturbance: Secondary | ICD-10-CM | POA: Diagnosis not present

## 2021-08-09 DIAGNOSIS — M6281 Muscle weakness (generalized): Secondary | ICD-10-CM | POA: Diagnosis not present

## 2021-08-10 DIAGNOSIS — R2689 Other abnormalities of gait and mobility: Secondary | ICD-10-CM | POA: Diagnosis not present

## 2021-08-10 DIAGNOSIS — M6259 Muscle wasting and atrophy, not elsewhere classified, multiple sites: Secondary | ICD-10-CM | POA: Diagnosis not present

## 2021-08-10 DIAGNOSIS — M6281 Muscle weakness (generalized): Secondary | ICD-10-CM | POA: Diagnosis not present

## 2021-08-10 DIAGNOSIS — F0390 Unspecified dementia without behavioral disturbance: Secondary | ICD-10-CM | POA: Diagnosis not present

## 2021-08-10 DIAGNOSIS — R262 Difficulty in walking, not elsewhere classified: Secondary | ICD-10-CM | POA: Diagnosis not present

## 2021-08-11 DIAGNOSIS — F0390 Unspecified dementia without behavioral disturbance: Secondary | ICD-10-CM | POA: Diagnosis not present

## 2021-08-11 DIAGNOSIS — M6281 Muscle weakness (generalized): Secondary | ICD-10-CM | POA: Diagnosis not present

## 2021-08-11 DIAGNOSIS — M6259 Muscle wasting and atrophy, not elsewhere classified, multiple sites: Secondary | ICD-10-CM | POA: Diagnosis not present

## 2021-08-11 DIAGNOSIS — R262 Difficulty in walking, not elsewhere classified: Secondary | ICD-10-CM | POA: Diagnosis not present

## 2021-08-11 DIAGNOSIS — R2689 Other abnormalities of gait and mobility: Secondary | ICD-10-CM | POA: Diagnosis not present

## 2021-08-12 DIAGNOSIS — F0390 Unspecified dementia without behavioral disturbance: Secondary | ICD-10-CM | POA: Diagnosis not present

## 2021-08-12 DIAGNOSIS — M6281 Muscle weakness (generalized): Secondary | ICD-10-CM | POA: Diagnosis not present

## 2021-08-12 DIAGNOSIS — M6259 Muscle wasting and atrophy, not elsewhere classified, multiple sites: Secondary | ICD-10-CM | POA: Diagnosis not present

## 2021-08-12 DIAGNOSIS — R2689 Other abnormalities of gait and mobility: Secondary | ICD-10-CM | POA: Diagnosis not present

## 2021-08-12 DIAGNOSIS — R262 Difficulty in walking, not elsewhere classified: Secondary | ICD-10-CM | POA: Diagnosis not present

## 2021-08-15 DIAGNOSIS — M6259 Muscle wasting and atrophy, not elsewhere classified, multiple sites: Secondary | ICD-10-CM | POA: Diagnosis not present

## 2021-08-15 DIAGNOSIS — F0390 Unspecified dementia without behavioral disturbance: Secondary | ICD-10-CM | POA: Diagnosis not present

## 2021-08-15 DIAGNOSIS — R2689 Other abnormalities of gait and mobility: Secondary | ICD-10-CM | POA: Diagnosis not present

## 2021-08-15 DIAGNOSIS — R262 Difficulty in walking, not elsewhere classified: Secondary | ICD-10-CM | POA: Diagnosis not present

## 2021-08-15 DIAGNOSIS — M6281 Muscle weakness (generalized): Secondary | ICD-10-CM | POA: Diagnosis not present

## 2021-08-16 DIAGNOSIS — F0390 Unspecified dementia without behavioral disturbance: Secondary | ICD-10-CM | POA: Diagnosis not present

## 2021-08-16 DIAGNOSIS — R262 Difficulty in walking, not elsewhere classified: Secondary | ICD-10-CM | POA: Diagnosis not present

## 2021-08-16 DIAGNOSIS — M6259 Muscle wasting and atrophy, not elsewhere classified, multiple sites: Secondary | ICD-10-CM | POA: Diagnosis not present

## 2021-08-16 DIAGNOSIS — R0782 Intercostal pain: Secondary | ICD-10-CM | POA: Diagnosis not present

## 2021-08-16 DIAGNOSIS — M6281 Muscle weakness (generalized): Secondary | ICD-10-CM | POA: Diagnosis not present

## 2021-08-16 DIAGNOSIS — R2689 Other abnormalities of gait and mobility: Secondary | ICD-10-CM | POA: Diagnosis not present

## 2021-08-17 DIAGNOSIS — F0390 Unspecified dementia without behavioral disturbance: Secondary | ICD-10-CM | POA: Diagnosis not present

## 2021-08-17 DIAGNOSIS — M6259 Muscle wasting and atrophy, not elsewhere classified, multiple sites: Secondary | ICD-10-CM | POA: Diagnosis not present

## 2021-08-17 DIAGNOSIS — M6281 Muscle weakness (generalized): Secondary | ICD-10-CM | POA: Diagnosis not present

## 2021-08-17 DIAGNOSIS — R2689 Other abnormalities of gait and mobility: Secondary | ICD-10-CM | POA: Diagnosis not present

## 2021-08-17 DIAGNOSIS — R262 Difficulty in walking, not elsewhere classified: Secondary | ICD-10-CM | POA: Diagnosis not present

## 2021-08-18 DIAGNOSIS — R262 Difficulty in walking, not elsewhere classified: Secondary | ICD-10-CM | POA: Diagnosis not present

## 2021-08-18 DIAGNOSIS — F3341 Major depressive disorder, recurrent, in partial remission: Secondary | ICD-10-CM | POA: Diagnosis not present

## 2021-08-18 DIAGNOSIS — F0390 Unspecified dementia without behavioral disturbance: Secondary | ICD-10-CM | POA: Diagnosis not present

## 2021-08-18 DIAGNOSIS — R2689 Other abnormalities of gait and mobility: Secondary | ICD-10-CM | POA: Diagnosis not present

## 2021-08-18 DIAGNOSIS — M6259 Muscle wasting and atrophy, not elsewhere classified, multiple sites: Secondary | ICD-10-CM | POA: Diagnosis not present

## 2021-08-18 DIAGNOSIS — M6281 Muscle weakness (generalized): Secondary | ICD-10-CM | POA: Diagnosis not present

## 2021-08-19 DIAGNOSIS — R2689 Other abnormalities of gait and mobility: Secondary | ICD-10-CM | POA: Diagnosis not present

## 2021-08-19 DIAGNOSIS — M6259 Muscle wasting and atrophy, not elsewhere classified, multiple sites: Secondary | ICD-10-CM | POA: Diagnosis not present

## 2021-08-19 DIAGNOSIS — F0390 Unspecified dementia without behavioral disturbance: Secondary | ICD-10-CM | POA: Diagnosis not present

## 2021-08-19 DIAGNOSIS — M6281 Muscle weakness (generalized): Secondary | ICD-10-CM | POA: Diagnosis not present

## 2021-08-19 DIAGNOSIS — R262 Difficulty in walking, not elsewhere classified: Secondary | ICD-10-CM | POA: Diagnosis not present

## 2022-02-14 ENCOUNTER — Emergency Department (HOSPITAL_COMMUNITY): Payer: Medicare Other

## 2022-02-14 ENCOUNTER — Other Ambulatory Visit: Payer: Self-pay

## 2022-02-14 ENCOUNTER — Inpatient Hospital Stay (HOSPITAL_COMMUNITY)
Admission: EM | Admit: 2022-02-14 | Discharge: 2022-02-18 | DRG: 193 | Disposition: A | Discharge status: Skilled Nursing Facility | Payer: Medicare Other | Source: Skilled Nursing Facility | Attending: Internal Medicine | Admitting: Internal Medicine

## 2022-02-14 DIAGNOSIS — Z809 Family history of malignant neoplasm, unspecified: Secondary | ICD-10-CM

## 2022-02-14 DIAGNOSIS — F03A3 Unspecified dementia, mild, with mood disturbance: Secondary | ICD-10-CM | POA: Diagnosis present

## 2022-02-14 DIAGNOSIS — R911 Solitary pulmonary nodule: Secondary | ICD-10-CM | POA: Diagnosis present

## 2022-02-14 DIAGNOSIS — J9601 Acute respiratory failure with hypoxia: Secondary | ICD-10-CM | POA: Diagnosis present

## 2022-02-14 DIAGNOSIS — N1832 Chronic kidney disease, stage 3b: Secondary | ICD-10-CM | POA: Diagnosis present

## 2022-02-14 DIAGNOSIS — Z8249 Family history of ischemic heart disease and other diseases of the circulatory system: Secondary | ICD-10-CM | POA: Diagnosis not present

## 2022-02-14 DIAGNOSIS — E785 Hyperlipidemia, unspecified: Secondary | ICD-10-CM | POA: Diagnosis present

## 2022-02-14 DIAGNOSIS — L89312 Pressure ulcer of right buttock, stage 2: Secondary | ICD-10-CM | POA: Diagnosis present

## 2022-02-14 DIAGNOSIS — R1310 Dysphagia, unspecified: Secondary | ICD-10-CM | POA: Diagnosis present

## 2022-02-14 DIAGNOSIS — Z823 Family history of stroke: Secondary | ICD-10-CM | POA: Diagnosis not present

## 2022-02-14 DIAGNOSIS — Z7982 Long term (current) use of aspirin: Secondary | ICD-10-CM | POA: Diagnosis not present

## 2022-02-14 DIAGNOSIS — I129 Hypertensive chronic kidney disease with stage 1 through stage 4 chronic kidney disease, or unspecified chronic kidney disease: Secondary | ICD-10-CM | POA: Diagnosis present

## 2022-02-14 DIAGNOSIS — K219 Gastro-esophageal reflux disease without esophagitis: Secondary | ICD-10-CM | POA: Diagnosis present

## 2022-02-14 DIAGNOSIS — Z85828 Personal history of other malignant neoplasm of skin: Secondary | ICD-10-CM | POA: Diagnosis not present

## 2022-02-14 DIAGNOSIS — F03A0 Unspecified dementia, mild, without behavioral disturbance, psychotic disturbance, mood disturbance, and anxiety: Secondary | ICD-10-CM | POA: Diagnosis not present

## 2022-02-14 DIAGNOSIS — Z66 Do not resuscitate: Secondary | ICD-10-CM | POA: Diagnosis present

## 2022-02-14 DIAGNOSIS — Z87891 Personal history of nicotine dependence: Secondary | ICD-10-CM

## 2022-02-14 DIAGNOSIS — F0390 Unspecified dementia without behavioral disturbance: Secondary | ICD-10-CM | POA: Diagnosis present

## 2022-02-14 DIAGNOSIS — I10 Essential (primary) hypertension: Secondary | ICD-10-CM | POA: Diagnosis present

## 2022-02-14 DIAGNOSIS — E872 Acidosis, unspecified: Secondary | ICD-10-CM | POA: Diagnosis present

## 2022-02-14 DIAGNOSIS — J101 Influenza due to other identified influenza virus with other respiratory manifestations: Principal | ICD-10-CM | POA: Diagnosis present

## 2022-02-14 DIAGNOSIS — Z1152 Encounter for screening for COVID-19: Secondary | ICD-10-CM | POA: Diagnosis not present

## 2022-02-14 DIAGNOSIS — Z79899 Other long term (current) drug therapy: Secondary | ICD-10-CM | POA: Diagnosis not present

## 2022-02-14 DIAGNOSIS — F39 Unspecified mood [affective] disorder: Secondary | ICD-10-CM | POA: Diagnosis present

## 2022-02-14 LAB — COMPREHENSIVE METABOLIC PANEL
ALT: 21 U/L (ref 0–44)
AST: 25 U/L (ref 15–41)
Albumin: 3 g/dL — ABNORMAL LOW (ref 3.5–5.0)
Alkaline Phosphatase: 51 U/L (ref 38–126)
Anion gap: 9 (ref 5–15)
BUN: 19 mg/dL (ref 8–23)
CO2: 21 mmol/L — ABNORMAL LOW (ref 22–32)
Calcium: 8.4 mg/dL — ABNORMAL LOW (ref 8.9–10.3)
Chloride: 111 mmol/L (ref 98–111)
Creatinine, Ser: 1.57 mg/dL — ABNORMAL HIGH (ref 0.44–1.00)
GFR, Estimated: 34 mL/min — ABNORMAL LOW (ref >60)
Glucose, Bld: 176 mg/dL — ABNORMAL HIGH (ref 70–99)
Potassium: 3.8 mmol/L (ref 3.5–5.1)
Sodium: 141 mmol/L (ref 135–145)
Total Bilirubin: 0.8 mg/dL (ref 0.3–1.2)
Total Protein: 5.9 g/dL — ABNORMAL LOW (ref 6.5–8.1)

## 2022-02-14 LAB — RESP PANEL BY RT-PCR (RSV, FLU A&B, COVID)  RVPGX2
Influenza A by PCR: POSITIVE — AB
Influenza B by PCR: NEGATIVE
Resp Syncytial Virus by PCR: NEGATIVE
SARS Coronavirus 2 by RT PCR: NEGATIVE

## 2022-02-14 LAB — CBC WITH DIFFERENTIAL/PLATELET
Abs Immature Granulocytes: 0.03 10*3/uL (ref 0.00–0.07)
Basophils Absolute: 0.1 10*3/uL (ref 0.0–0.1)
Basophils Relative: 1 %
Eosinophils Absolute: 0 10*3/uL (ref 0.0–0.5)
Eosinophils Relative: 0 %
HCT: 46.7 % — ABNORMAL HIGH (ref 36.0–46.0)
Hemoglobin: 14.9 g/dL (ref 12.0–15.0)
Immature Granulocytes: 0 %
Lymphocytes Relative: 3 %
Lymphs Abs: 0.3 10*3/uL — ABNORMAL LOW (ref 0.7–4.0)
MCH: 30.8 pg (ref 26.0–34.0)
MCHC: 31.9 g/dL (ref 30.0–36.0)
MCV: 96.5 fL (ref 80.0–100.0)
Monocytes Absolute: 0.8 10*3/uL (ref 0.1–1.0)
Monocytes Relative: 8 %
Neutro Abs: 9.3 10*3/uL — ABNORMAL HIGH (ref 1.7–7.7)
Neutrophils Relative %: 88 %
Platelets: 239 10*3/uL (ref 150–400)
RBC: 4.84 MIL/uL (ref 3.87–5.11)
RDW: 14.8 % (ref 11.5–15.5)
WBC: 10.5 10*3/uL (ref 4.0–10.5)
nRBC: 0 % (ref 0.0–0.2)

## 2022-02-14 LAB — LACTIC ACID, PLASMA: Lactic Acid, Venous: 2.5 mmol/L (ref 0.5–1.9)

## 2022-02-14 LAB — I-STAT VENOUS BLOOD GAS, ED
Acid-base deficit: 4 mmol/L — ABNORMAL HIGH (ref 0.0–2.0)
Bicarbonate: 21.1 mmol/L (ref 20.0–28.0)
Calcium, Ion: 1.12 mmol/L — ABNORMAL LOW (ref 1.15–1.40)
HCT: 45 % (ref 36.0–46.0)
Hemoglobin: 15.3 g/dL — ABNORMAL HIGH (ref 12.0–15.0)
O2 Saturation: 99 %
Potassium: 3.9 mmol/L (ref 3.5–5.1)
Sodium: 141 mmol/L (ref 135–145)
TCO2: 22 mmol/L (ref 22–32)
pCO2, Ven: 37 mmHg — ABNORMAL LOW (ref 44–60)
pH, Ven: 7.364 (ref 7.25–7.43)
pO2, Ven: 121 mmHg — ABNORMAL HIGH (ref 32–45)

## 2022-02-14 LAB — TROPONIN I (HIGH SENSITIVITY)
Troponin I (High Sensitivity): 17 ng/L (ref <18)
Troponin I (High Sensitivity): 27 ng/L — ABNORMAL HIGH (ref <18)

## 2022-02-14 MED ORDER — ESCITALOPRAM OXALATE 10 MG PO TABS
20.0000 mg | ORAL_TABLET | Freq: Every morning | ORAL | Status: DC
  Administered 2022-02-15 – 2022-02-18 (×4): 20 mg via ORAL
  Filled 2022-02-14 (×4): qty 2

## 2022-02-14 MED ORDER — MEMANTINE HCL 10 MG PO TABS
10.0000 mg | ORAL_TABLET | Freq: Two times a day (BID) | ORAL | Status: DC
  Administered 2022-02-14 – 2022-02-18 (×8): 10 mg via ORAL
  Filled 2022-02-14 (×8): qty 1

## 2022-02-14 MED ORDER — ONDANSETRON HCL 4 MG PO TABS: 4.0000 mg | ORAL_TABLET | Freq: Four times a day (QID) | ORAL | Status: DC | PRN

## 2022-02-14 MED ORDER — ASPIRIN 81 MG PO TBEC
81.0000 mg | DELAYED_RELEASE_TABLET | Freq: Every day | ORAL | Status: DC
  Administered 2022-02-15 – 2022-02-18 (×4): 81 mg via ORAL
  Filled 2022-02-14 (×4): qty 1

## 2022-02-14 MED ORDER — IPRATROPIUM BROMIDE 0.02 % IN SOLN
0.5000 mg | Freq: Four times a day (QID) | RESPIRATORY_TRACT | Status: DC
  Administered 2022-02-15: 0.5 mg via RESPIRATORY_TRACT
  Filled 2022-02-14 (×3): qty 2.5

## 2022-02-14 MED ORDER — ACETAMINOPHEN 325 MG PO TABS
650.0000 mg | ORAL_TABLET | Freq: Four times a day (QID) | ORAL | Status: DC | PRN
  Administered 2022-02-15 – 2022-02-18 (×5): 650 mg via ORAL
  Filled 2022-02-14 (×5): qty 2

## 2022-02-14 MED ORDER — BENZONATATE 100 MG PO CAPS
100.0000 mg | ORAL_CAPSULE | Freq: Three times a day (TID) | ORAL | Status: DC
  Administered 2022-02-14 – 2022-02-18 (×11): 100 mg via ORAL
  Filled 2022-02-14 (×12): qty 1

## 2022-02-14 MED ORDER — GUAIFENESIN ER 600 MG PO TB12
600.0000 mg | ORAL_TABLET | Freq: Two times a day (BID) | ORAL | Status: DC
  Administered 2022-02-14 – 2022-02-16 (×4): 600 mg via ORAL
  Filled 2022-02-14 (×4): qty 1

## 2022-02-14 MED ORDER — MIRTAZAPINE 15 MG PO TABS
7.5000 mg | ORAL_TABLET | Freq: Every day | ORAL | Status: DC
  Administered 2022-02-14 – 2022-02-17 (×4): 7.5 mg via ORAL
  Filled 2022-02-14 (×4): qty 1

## 2022-02-14 MED ORDER — ALBUTEROL SULFATE (2.5 MG/3ML) 0.083% IN NEBU
2.5000 mg | INHALATION_SOLUTION | Freq: Four times a day (QID) | RESPIRATORY_TRACT | Status: DC
  Administered 2022-02-15: 2.5 mg via RESPIRATORY_TRACT
  Filled 2022-02-14 (×3): qty 3

## 2022-02-14 MED ORDER — ZONISAMIDE 100 MG PO CAPS
200.0000 mg | ORAL_CAPSULE | Freq: Every day | ORAL | Status: DC
  Administered 2022-02-14 – 2022-02-17 (×4): 200 mg via ORAL
  Filled 2022-02-14 (×5): qty 2

## 2022-02-14 MED ORDER — LACTATED RINGERS IV SOLN: INTRAVENOUS | Status: DC

## 2022-02-14 MED ORDER — ATORVASTATIN CALCIUM 40 MG PO TABS
40.0000 mg | ORAL_TABLET | ORAL | Status: DC
  Administered 2022-02-16: 40 mg via ORAL
  Filled 2022-02-14: qty 1

## 2022-02-14 MED ORDER — DONEPEZIL HCL 10 MG PO TABS
10.0000 mg | ORAL_TABLET | Freq: Every day | ORAL | Status: DC
  Administered 2022-02-14 – 2022-02-17 (×4): 10 mg via ORAL
  Filled 2022-02-14 (×4): qty 1

## 2022-02-14 MED ORDER — SENNOSIDES-DOCUSATE SODIUM 8.6-50 MG PO TABS
1.0000 | ORAL_TABLET | Freq: Every evening | ORAL | Status: DC | PRN
  Administered 2022-02-17: 1 via ORAL
  Filled 2022-02-14: qty 1

## 2022-02-14 MED ORDER — IOHEXOL 350 MG/ML SOLN
65.0000 mL | Freq: Once | INTRAVENOUS | Status: AC | PRN
  Administered 2022-02-14: 65 mL via INTRAVENOUS

## 2022-02-14 MED ORDER — RISPERIDONE 1 MG PO TABS
1.0000 mg | ORAL_TABLET | Freq: Every day | ORAL | Status: DC
  Administered 2022-02-14 – 2022-02-17 (×4): 1 mg via ORAL
  Filled 2022-02-14 (×5): qty 1

## 2022-02-14 MED ORDER — LACTATED RINGERS IV BOLUS
1000.0000 mL | Freq: Once | INTRAVENOUS | Status: AC
  Administered 2022-02-14: 1000 mL via INTRAVENOUS

## 2022-02-14 MED ORDER — HEPARIN SODIUM (PORCINE) 5000 UNIT/ML IJ SOLN
5000.0000 [IU] | Freq: Three times a day (TID) | INTRAMUSCULAR | Status: DC
  Administered 2022-02-14 – 2022-02-18 (×12): 5000 [IU] via SUBCUTANEOUS
  Filled 2022-02-14 (×12): qty 1

## 2022-02-14 MED ORDER — OSELTAMIVIR PHOSPHATE 30 MG PO CAPS
30.0000 mg | ORAL_CAPSULE | Freq: Two times a day (BID) | ORAL | Status: DC
  Administered 2022-02-14 – 2022-02-18 (×7): 30 mg via ORAL
  Filled 2022-02-14 (×10): qty 1

## 2022-02-14 MED ORDER — ACETAMINOPHEN 650 MG RE SUPP: 650.0000 mg | Freq: Four times a day (QID) | RECTAL | Status: DC | PRN

## 2022-02-14 MED ORDER — DEXTROMETHORPHAN POLISTIREX ER 30 MG/5ML PO SUER
30.0000 mg | Freq: Two times a day (BID) | ORAL | Status: DC
  Administered 2022-02-14 – 2022-02-15 (×3): 30 mg via ORAL
  Filled 2022-02-14 (×4): qty 5

## 2022-02-14 MED ORDER — ONDANSETRON HCL 4 MG/2ML IJ SOLN: 4.0000 mg | Freq: Four times a day (QID) | INTRAMUSCULAR | Status: DC | PRN

## 2022-02-14 NOTE — ED Provider Notes (Signed)
[facility] EMERGENCY DEPARTMENT Provider Note   CSN: [id number] Arrival date & time: 02/14/22  1222     History  Chief Complaint  Patient presents with   Cough   Shortness of Breath    For 3 weeks    Jamie George is a 76 y.o. female.  76 year old female presents today for evaluation of shortness of breath and cough which she reports has been going on for 3 weeks.  Patient was found to be hypoxic at the nursing home facility.  Currently she is on 6 L nasal cannula O2 satting mid 90s.  She denies chest pain, lightheadedness, palpitations,.  She states she does wear supplemental O2 at baseline.  Unable to obtain collateral from nursing home.  Attempted to call without success. Discussion had with nurse who states EMS was given a report at the rehab facility and was told patient is not on baseline O2.  Patient initially upon EMS arrival was satting low 80s on room air.  The history is provided by the patient. No language interpreter was used.       Home Medications Prior to Admission medications   Medication Sig Start Date End Date Taking? Authorizing Provider  acetaminophen (TYLENOL) 325 MG tablet Take 650 mg by mouth every 8 (eight) hours as needed (pain).    [provider]  acetaminophen (TYLENOL) 500 MG tablet Take 1,000 mg by mouth 2 (two) times daily.    [provider]  amoxicillin-clavulanate (AUGMENTIN) 500-125 MG tablet Take 1 tablet (500 mg total) by mouth every 8 (eight) hours. Patient not taking: Reported on 01/27/2021 06/19/20   Varney Biles, MD  aspirin EC 81 MG EC tablet Take 1 tablet (81 mg total) by mouth daily. With food 08/15/17   Roxan Hockey, MD  atorvastatin (LIPITOR) 40 MG tablet Take 40 mg by mouth 2 (two) times a week. on Thurs & Sun    [provider]  docusate sodium (COLACE) 100 MG capsule Take 100 mg by mouth daily.    [provider]  donepezil (ARICEPT) 10 MG tablet Take 10 mg by  mouth at bedtime.    [provider]  escitalopram (LEXAPRO) 20 MG tablet Take 20 mg by mouth daily.  05/03/17   [provider]  ferrous sulfate 325 (65 FE) MG tablet Take 325 mg by mouth 2 (two) times daily with a meal.    [provider]  fluticasone (FLONASE) 50 MCG/ACT nasal spray Place 1 spray into both nostrils daily.    [provider]  Melatonin 10 MG TABS Take 10 mg by mouth at bedtime.    [provider]  memantine (NAMENDA) 10 MG tablet Take 1 tablet (10 mg total) by mouth 2 (two) times daily. 12/17/17   Penumalli, Earlean Polka, MD  mirtazapine (REMERON) 7.5 MG tablet Take 7.5 mg by mouth at bedtime.    [provider]  pantoprazole (PROTONIX) 40 MG tablet Take 40 mg by mouth 2 (two) times daily.     [provider]  polyethylene glycol (MIRALAX / GLYCOLAX) packet Take 17 g by mouth daily.    [provider]  risperiDONE (RISPERDAL) 0.5 MG tablet Take 1 mg by mouth at bedtime.     [provider]  traZODone (DESYREL) 50 MG tablet Take 50 mg by mouth at bedtime. 05/03/17   [provider]  verapamil (CALAN) 120 MG tablet Take 120 mg by mouth daily.  11/09/19   [provider]  zonisamide (  ZONEGRAN) 100 MG capsule Take 200 mg by mouth at bedtime.     [provider]      Allergies    Patient has no known allergies.    Review of Systems   Review of Systems  Constitutional:  Negative for chills and fever.  Respiratory:  Positive for cough and shortness of breath.   Cardiovascular:  Negative for chest pain, palpitations and leg swelling.  Neurological:  Negative for weakness and light-headedness.  All other systems reviewed and are negative.   Physical Exam Updated Vital Signs BP (!) 93/52   Pulse 87   Temp 97.6 F (36.4 C) (Oral)   Resp 18   SpO2 91%  Physical Exam Vitals and nursing note reviewed.  Constitutional:      General: She is not in acute distress.     Appearance: Normal appearance. She is ill-appearing (Chronically ill appearing).  HENT:     Head: Normocephalic and atraumatic.     Nose: Nose normal.  Eyes:     General: No scleral icterus.    Extraocular Movements: Extraocular movements intact.     Conjunctiva/sclera: Conjunctivae normal.  Cardiovascular:     Rate and Rhythm: Normal rate and regular rhythm.     Pulses: Normal pulses.  Pulmonary:     Effort: Pulmonary effort is normal. No respiratory distress.     Breath sounds: No wheezing.  Abdominal:     General: There is no distension.     Tenderness: There is no abdominal tenderness.  Musculoskeletal:        General: Normal range of motion.     Cervical back: Normal range of motion.     Right lower leg: No edema.     Left lower leg: No edema.  Skin:    General: Skin is warm and dry.  Neurological:     General: No focal deficit present.     Mental Status: She is alert. Mental status is at baseline.     ED Results / Procedures / Treatments   Labs (all labs ordered are listed, but only abnormal results are displayed) Labs Reviewed  CBC WITH DIFFERENTIAL/PLATELET - Abnormal; Notable for the following components:      Result Value   HCT 46.7 (*)    Neutro Abs 9.3 (*)    Lymphs Abs 0.3 (*)    All other components within normal limits  RESP PANEL BY RT-PCR (RSV, FLU A&B, COVID)  RVPGX2  COMPREHENSIVE METABOLIC PANEL  LACTIC ACID, PLASMA  LACTIC ACID, PLASMA  I-STAT VENOUS BLOOD GAS, ED  TROPONIN I (HIGH SENSITIVITY)    EKG None  Radiology DG Chest 1 View  Result Date: 02/14/2022 CLINICAL DATA:  Cough and shortness of breath for 3 weeks EXAM: CHEST  1 VIEW COMPARISON:  01/27/2021 FINDINGS: Upper normal size of cardiac silhouette. Mediastinal contours and pulmonary vascularity normal. Atherosclerotic calcification aorta. Elevation of RIGHT diaphragm with bibasilar atelectasis. Upper lungs clear. No definite acute infiltrate, pleural effusion, or pneumothorax.  Osseous structures unremarkable. IMPRESSION: Bibasilar atelectasis. Insert choose on Electronically Signed   By: Lavonia Dana M.D.   On: 02/14/2022 13:43    Procedures Procedures    Medications Ordered in ED Medications - No data to display  ED Course/ Medical Decision Making/ A&P                           Medical Decision Making Amount and/or Complexity of Data Reviewed Labs: ordered. Radiology: ordered.  Medical Decision Making / ED Course   This patient presents to the ED for concern of cough, shortness of breath, this involves an extensive number of treatment options, and is a complaint that carries with it a high risk of complications and morbidity.  The differential diagnosis includes pneumonia, viral URI, PE  MDM: 76 year old female presents today for evaluation of above-mentioned complaint.  She is currently on 6 L nasal cannula satting mid to low 90s.  This is a new O2 requirement.  Persistent cough throughout the interview.  Will obtain chest x-ray, blood work.  There was mention of her being hypoxic at the nursing home facility.  I attempted to call nursing home but did not get an answer.  Chest x-ray shows bibasilar atelectasis but no definite pneumonia.  Will obtain CT PE scan.  At the end of my shift patient is awaiting CT PE scan.  She is flu positive.  Lactic acidosis of 245 present.  Will give fluid bolus.  Troponin of 17.  Without chest pain.  Low suspicion for ACS.  EKG without acute ischemic changes.   Lab Tests: -I ordered, reviewed, and interpreted labs.   The pertinent results include:   Labs Reviewed  CBC WITH DIFFERENTIAL/PLATELET - Abnormal; Notable for the following components:      Result Value   HCT 46.7 (*)    Neutro Abs 9.3 (*)    Lymphs Abs 0.3 (*)    All other components within normal limits  RESP PANEL BY RT-PCR (RSV, FLU A&B, COVID)  RVPGX2  COMPREHENSIVE METABOLIC PANEL  LACTIC ACID, PLASMA  LACTIC ACID, PLASMA  I-STAT VENOUS BLOOD GAS, ED   TROPONIN I (HIGH SENSITIVITY)      EKG  EKG Interpretation  Date/Time:    Ventricular Rate:    PR Interval:    QRS Duration:   QT Interval:    QTC Calculation:   R Axis:     Text Interpretation:           Imaging Studies ordered: I ordered imaging studies including chest x-ray.  CT PE study ordered however cannot resulted prior to enema shift I independently visualized and interpreted imaging. I agree with the radiologist interpretation   Medicines ordered and prescription drug management: No orders of the defined types were placed in this encounter.   -I have reviewed the patients home medicines and have made adjustments as needed  Critical interventions IV fluids   Reevaluation: After the interventions noted above, I reevaluated the patient and found that they have :stayed the same  Co morbidities that complicate the patient evaluation  Past Medical History:  Diagnosis Date   Cancer ([place])    skin   Complication of anesthesia    " difficult to wake me up "   Depression    Duodenitis    GERD (gastroesophageal reflux disease)    Hypertension    Insomnia    Migraines    Mild dementia       Dispostion: Patient had an right shift signout to oncoming provider to follow-up on PET scan.  Following that she will likely require admission for new O2 requirement.   Final Clinical Impression(s) / ED Diagnoses Final diagnoses:  None    Rx / DC Orders ED Discharge Orders     None         Evlyn Courier, PA-C 02/14/22 1548    Elgie Congo, MD 02/15/22 [phone]

## 2022-02-14 NOTE — ED Provider Notes (Signed)
Received signout from previous provider, please see his note for complete H&P.  This is a 76 year old female with multiple comorbidities presenting with complaints of shortness of breath.  She was brought here from a nursing facility, [facility].  She has had flulike symptoms ongoing for the past 12 days.  She endorsed increased cough and shortness of breath.  She was noted to have an O2 sats of 83% on room air with improvement after she was placed on 6 L of nasal cannula.  She normally does not wear oxygen.  Workup is notable for evidence of AKI with a creatinine of 1.57.  IV fluid was given.  She has an elevated lactic acid of 2.5 however she has normal WBC of 10.5.  Respiratory panel was positive for influenza A.  Due to severe hypoxia, VBG obtained and she is not acidotic.  Chest CT angiogram show no evidence of PE but does shows multiple pulmonary nodules favor inflammatory or infectious etiology.  I appreciate consultation from [facility] hospitalist, Dr. Posey Pronto who agrees to see and will admit patient for further management of her condition.  Patient is made aware of plan and agrees with plan.  At this time patient appears comfortable.  .Critical Care  Performed by: Domenic Moras, PA-C Authorized by: Domenic Moras, PA-C   Critical care provider statement:    Critical care time (minutes):  35   Critical care was time spent personally by me on the following activities:  Development of treatment plan with patient or surrogate, discussions with consultants, evaluation of patient's response to treatment, examination of patient, ordering and review of laboratory studies, ordering and review of radiographic studies, ordering and performing treatments and interventions, pulse oximetry, re-evaluation of patient's condition and review of old charts  BP (!) 110/51   Pulse 95   Temp 97.6 F (36.4 C) (Oral)   Resp 16   SpO2 (!) 83%   Results for orders placed or performed during the hospital encounter of  02/14/22  Resp panel by RT-PCR (RSV, Flu A&B, Covid) Anterior Nasal Swab   Specimen: Anterior Nasal Swab  Result Value Ref Range   SARS Coronavirus 2 by RT PCR NEGATIVE NEGATIVE   Influenza A by PCR POSITIVE (A) NEGATIVE   Influenza B by PCR NEGATIVE NEGATIVE   Resp Syncytial Virus by PCR NEGATIVE NEGATIVE  CBC with Differential  Result Value Ref Range   WBC 10.5 4.0 - 10.5 K/uL   RBC 4.84 3.87 - 5.11 MIL/uL   Hemoglobin 14.9 12.0 - 15.0 g/dL   HCT 46.7 (H) 36.0 - 46.0 %   MCV 96.5 80.0 - 100.0 fL   MCH 30.8 26.0 - 34.0 pg   MCHC 31.9 30.0 - 36.0 g/dL   RDW 14.8 11.5 - 15.5 %   Platelets 239 150 - 400 K/uL   nRBC 0.0 0.0 - 0.2 %   Neutrophils Relative % 88 %   Neutro Abs 9.3 (H) 1.7 - 7.7 K/uL   Lymphocytes Relative 3 %   Lymphs Abs 0.3 (L) 0.7 - 4.0 K/uL   Monocytes Relative 8 %   Monocytes Absolute 0.8 0.1 - 1.0 K/uL   Eosinophils Relative 0 %   Eosinophils Absolute 0.0 0.0 - 0.5 K/uL   Basophils Relative 1 %   Basophils Absolute 0.1 0.0 - 0.1 K/uL   Immature Granulocytes 0 %   Abs Immature Granulocytes 0.03 0.00 - 0.07 K/uL  Comprehensive metabolic panel  Result Value Ref Range   Sodium 141 135 -  145 mmol/L   Potassium 3.8 3.5 - 5.1 mmol/L   Chloride 111 98 - 111 mmol/L   CO2 21 (L) 22 - 32 mmol/L   Glucose, Bld 176 (H) 70 - 99 mg/dL   BUN 19 8 - 23 mg/dL   Creatinine, Ser 1.57 (H) 0.44 - 1.00 mg/dL   Calcium 8.4 (L) 8.9 - 10.3 mg/dL   Total Protein 5.9 (L) 6.5 - 8.1 g/dL   Albumin 3.0 (L) 3.5 - 5.0 g/dL   AST 25 15 - 41 U/L   ALT 21 0 - 44 U/L   Alkaline Phosphatase 51 38 - 126 U/L   Total Bilirubin 0.8 0.3 - 1.2 mg/dL   GFR, Estimated 34 (L) >60 mL/min   Anion gap 9 5 - 15  Lactic acid, plasma  Result Value Ref Range   Lactic Acid, Venous 2.5 (HH) 0.5 - 1.9 mmol/L  I-Stat venous blood gas, ([facility], [facility], [facility])  Result Value Ref Range   pH, Ven 7.364 7.25 - 7.43   pCO2, Ven 37.0 (L) 44 - 60 mmHg   pO2, Ven 121 (H) 32 - 45 mmHg   Bicarbonate 21.1 20.0 - 28.0  mmol/L   TCO2 22 22 - 32 mmol/L   O2 Saturation 99 %   Acid-base deficit 4.0 (H) 0.0 - 2.0 mmol/L   Sodium 141 135 - 145 mmol/L   Potassium 3.9 3.5 - 5.1 mmol/L   Calcium, Ion 1.12 (L) 1.15 - 1.40 mmol/L   HCT 45.0 36.0 - 46.0 %   Hemoglobin 15.3 (H) 12.0 - 15.0 g/dL   Sample type VENOUS   Troponin I (High Sensitivity)  Result Value Ref Range   Troponin I (High Sensitivity) 17 <18 ng/L   CT Angio Chest PE W and/or Wo Contrast  Result Date: 02/14/2022 CLINICAL DATA:  Cough, short of breath for 3 weeks, hypoxia EXAM: CT ANGIOGRAPHY CHEST WITH CONTRAST TECHNIQUE: Multidetector CT imaging of the chest was performed using the standard protocol during bolus administration of intravenous contrast. Multiplanar CT image reconstructions and MIPs were obtained to evaluate the vascular anatomy. RADIATION DOSE REDUCTION: This exam was performed according to the departmental dose-optimization program which includes automated exposure control, adjustment of the mA and/or kV according to patient size and/or use of iterative reconstruction technique. CONTRAST:  65m OMNIPAQUE IOHEXOL 350 MG/ML SOLN COMPARISON:  02/14/2022, 01/27/2021 FINDINGS: Cardiovascular: This is a technically adequate evaluation of the pulmonary vasculature. There are no filling defects or pulmonary emboli. The heart is unremarkable without pericardial effusion. No evidence of thoracic aortic aneurysm or dissection. Atherosclerosis of the aorta and coronary vasculature. Mediastinum/Nodes: No enlarged mediastinal, hilar, or axillary lymph nodes. Thyroid gland, trachea, and esophagus demonstrate no significant findings. Lungs/Pleura: Dependent hypoventilatory changes are seen bilaterally. There is an 8 mm subpleural left lower lobe nodule reference image 72/6, new since prior exam. 6 mm lingular nodule image 79/6 is also new since previous study. Tree in bud nodular opacities are seen within the right upper lobe, reference image 40 3-45 of series  6, measuring up to 4 mm in size. No other airspace disease, effusion, or pneumothorax. The central airways are patent. Upper Abdomen: No acute abnormality. Musculoskeletal: No acute or destructive bony lesions. Reconstructed images demonstrate no additional findings. Review of the MIP images confirms the above findings. IMPRESSION: 1. No evidence of pulmonary embolus. 2. Multiple pulmonary nodules, favor inflammatory or infectious etiology. Most significant: 8 mm left solid pulmonary nodule. Per Fleischner Society Guidelines, recommend a non-contrast Chest CT  at 3-6 months, then consider another non-contrast Chest CT at 18-24 months. If patient is low risk for malignancy, non-contrast Chest CT at 18-24 months is optional. These guidelines do not apply to immunocompromised patients and patients with cancer. Follow up in patients with significant comorbidities as clinically warranted. For lung cancer screening, adhere to Lung-RADS guidelines. Reference: Radiology. 2017; 284(1):228-43. 3. Aortic Atherosclerosis (ICD10-I70.0). Coronary artery atherosclerosis. Electronically Signed   By: Randa Ngo M.D.   On: 02/14/2022 16:20   DG Chest 1 View  Result Date: 02/14/2022 CLINICAL DATA:  Cough and shortness of breath for 3 weeks EXAM: CHEST  1 VIEW COMPARISON:  01/27/2021 FINDINGS: Upper normal size of cardiac silhouette. Mediastinal contours and pulmonary vascularity normal. Atherosclerotic calcification aorta. Elevation of RIGHT diaphragm with bibasilar atelectasis. Upper lungs clear. No definite acute infiltrate, pleural effusion, or pneumothorax. Osseous structures unremarkable. IMPRESSION: Bibasilar atelectasis. Insert choose on Electronically Signed   By: Lavonia Dana M.D.   On: 02/14/2022 13:43      Domenic Moras, PA-C 02/14/22 1719    Charlesetta Shanks, MD 03/01/22 1401

## 2022-02-14 NOTE — H&P (Addendum)
History and Physical  Patient: Jamie George VOJ:[id number] DOB: June 06, 1945 DOA: 02/14/2022 DOS: the patient was seen and examined on 02/14/2022 Patient coming from: [facility]  Chief Complaint:  Chief Complaint  Patient presents with   Cough   Shortness of Breath    For 3 weeks   HPI: [name] is a 76 y.o. female with PMH significant of dementia, 1 disorder, HTN, GERD, depression, HLD. Patient is a resident at [facility].  Been there since May 2023. Presented with complaints of cough and shortness of breath. She reported to me that she has cough and shortness of breath for the last 6 weeks.  When I asked for further clarification and asked if she has cough since Thanksgiving, she says yes.  She reported to the ED provider that she has cough for last 3 weeks.  She reported to another ED provider that she has cough for the last 12 days. Per my conversation with the RN at the [facility] she has cough ongoing for last 2 days. No fever no chills reported.  No nausea no vomiting.  No recent change in medications reported.  She does not use oxygen at her baseline but was requiring 2 L of oxygen before leaving [facility]. No fall no injury reported by the staff. She is not on a special diet.  No choking episode reported. Staff again he went reported that the influenza has been ongoing around at the facility.   Review of Systems: As mentioned in the history of present illness. All other systems reviewed and are negative. Past Medical History:  Diagnosis Date   Cancer ([place])    skin   Complication of anesthesia    " difficult to wake me up "   Depression    Duodenitis    GERD (gastroesophageal reflux disease)    Hypertension    Insomnia    Migraines    Mild dementia    Past Surgical History:  Procedure Laterality Date   ABDOMINAL HYSTERECTOMY     BACK SURGERY     BIOPSY  03/04/2018   Procedure: BIOPSY;  Surgeon: Otis Brace, MD;  Location: [facility];   Service: Gastroenterology;;   BRAIN SURGERY  2013   meningioma   ENTEROSCOPY N/A 03/04/2018   Procedure: PUSH ENTEROSCOPY;  Surgeon: Otis Brace, MD;  Location: [facility];  Service: Gastroenterology;  Laterality: N/A;   SHOULDER SURGERY     Social History:  reports that she has quit smoking. She has quit using smokeless tobacco. She reports that she does not currently use alcohol. She reports that she does not use drugs. No Known Allergies Family History  Problem Relation Age of Onset   Cancer Mother    Stroke Father    Heart disease Father    Prior to Admission medications   Medication Sig Start Date End Date Taking? Authorizing Provider  acetaminophen (TYLENOL) 325 MG tablet Take 650 mg by mouth every 8 (eight) hours as needed for mild pain.   Yes [provider]  acetaminophen (TYLENOL) 500 MG tablet Take 1,000 mg by mouth 3 (three) times daily. (0900, 1300, 2100)   Yes [provider]  albuterol (VENTOLIN HFA) 108 (90 Base) MCG/ACT inhaler Inhale 1 puff into the lungs 2 (two) times daily. (0800, 2000)   Yes [provider]  aspirin EC 81 MG EC tablet Take 1 tablet (81 mg total) by mouth daily. With food Patient taking differently: Take 81 mg by mouth daily. (0800) 08/15/17  Yes Emokpae,  Courage, MD  atorvastatin (LIPITOR) 40 MG tablet Take 40 mg by mouth See admin instructions. 40 mg every morning (0800) every Thursday, Sunday.   Yes [provider]  calcium carbonate (TUMS - DOSED IN MG ELEMENTAL CALCIUM) 500 MG chewable tablet Chew 500 mg by mouth 2 (two) times daily. (0800, 2000)   Yes [provider]  docusate sodium (COLACE) 100 MG capsule Take 100 mg by mouth in the morning. (0800)   Yes [provider]  donepezil (ARICEPT) 10 MG tablet Take 10 mg by mouth at bedtime. (2000)   Yes [provider]  escitalopram (LEXAPRO) 20 MG tablet Take 20 mg by mouth in the morning. (0800) 05/03/17  Yes [provider]   fluticasone (FLONASE) 50 MCG/ACT nasal spray Place 1 spray into both nostrils in the morning. (0800)   Yes [provider]  Melatonin 10 MG TABS Take 10 mg by mouth at bedtime. (2000)   Yes [provider]  memantine (NAMENDA) 10 MG tablet Take 1 tablet (10 mg total) by mouth 2 (two) times daily. Patient taking differently: Take 10 mg by mouth 2 (two) times daily. (0800, 2000) 12/17/17  Yes Penumalli, Earlean Polka, MD  Menthol, Topical Analgesic, (BIOFREEZE) 4 % GEL Apply 1 application  topically in the morning. (0800) to legs/thighs (mix with a mild moisturizer)   Yes [provider]  mirtazapine (REMERON) 7.5 MG tablet Take 7.5 mg by mouth at bedtime. (2000)   Yes [provider]  Nutritional Supplements (RESOURCE 2.0) LIQD Take 120 mLs by mouth 2 (two) times daily. (1000, 1400)   Yes [provider]  OXYGEN Inhale 2 L into the lungs as needed (sats < 90).   Yes [provider]  polyethylene glycol (MIRALAX / GLYCOLAX) packet Take 17 g by mouth in the morning. (0800)   Yes [provider]  risperiDONE (RISPERDAL) 1 MG tablet Take 1 mg by mouth at bedtime. (2100)   Yes [provider]  traMADol (ULTRAM) 50 MG tablet Take 50 mg by mouth every 12 (twelve) hours as needed (pain).   Yes [provider]  verapamil (CALAN-SR) 180 MG CR tablet Take 180 mg by mouth See admin instructions. 180 mg every morning (0900). Hold for SBP < 120, HR < 65.   Yes [provider]  zonisamide (ZONEGRAN) 100 MG capsule Take 200 mg by mouth at bedtime. (2000)   Yes [provider]  traZODone (DESYREL) 50 MG tablet Take 50 mg by mouth at bedtime. Patient not taking: Reported on 02/14/2022 05/03/17   [provider]   Physical Exam: Vitals:   02/14/22 1415 02/14/22 1430 02/14/22 1717 02/14/22 1722  BP:  (!) 110/51 (!) 144/70   Pulse: 85 95 79   Resp: (!) 22 16 (!) 21   Temp:    99.1 F (37.3 C)  TempSrc:    Oral   SpO2: 90% (!) 83% 100%    General: Appear in moderate distress; no visible Abnormal Neck Mass Or lumps, Conjunctiva normal Cardiovascular: S1 and S2 Present, no Murmur, Respiratory: increased respiratory effort, Bilateral Air entry present and  bilateral Crackles, bilateral expiratory  wheezes Abdomen: Bowel Sound present, Non tender  Extremities: no Pedal edema Neurology: alert and oriented to self Gait not checked due to patient safety concerns   Data Reviewed: Since last encounter, pertinent lab results CBC and BMP   . I have ordered test including CBC and BMP  . I have discussed pt's care plan and  test results with ED provider  .   Assessment and Plan Acute hypoxic respiratory failure (HCC) Influenza A infection. Lactic acidosis Patient presents with complaints of cough and shortness of breath.  84% on room air.  Currently requiring 6 L of oxygen.  Acute oxygen reading is not available due to patient's significant tremor.  Will recommend to switch to forehead for oxygen monitoring. Influenza A is positive.  CT PE protocol negative for pulmonary embolism. No significant consolidation.  COVID-negative. Will initiate Tamiflu. Also initiate nebulizer therapy, low threshold to initiate steroids.. Will add Mucinex, Delsym, Tessalon Perles. Patient does have oxygen order, 2 LPM as needed but has not used oxygen prior to today at the facility. Lactic Mostly elevated due to increased work of breathing.  Hypertension On valsartan at home. Will hold for now.  Chronic kidney disease, stage 3b with mild renal insufficiency. Baseline serum creatinine around 1.3. On admission serum creatinine 1.57. Not meeting AKI criteria but worsened from baseline. Received IV fluids.  Continue gentle IV hydration and monitor.  Dementia ([place]) Mood disorder ([place]) Patient is on Aricept, Namenda, Risperdal, Remeron and Zonegran prior to admission. Will continue all for now. Zonegran has not been  recommended to be used in CrCl less than 50.  May need to be adjusted by PCP down the road.  Hyperlipidemia Continue statin.  Lactic acidosis From increased work of breathing. Received IV fluid.  Monitor.  8 mm left pulmonary nodule. Noncontrast chest CT in 3 to 6 months is recommended for a follow-up. Although given her dementia need to discuss with family regarding the utility of the study before proceeding.  Advance Care Planning:   Code Status: DNR confirmed with the patient as well as staff at [facility]. Consults: None Family Communication: Attempted to reach daughter on number.  Did not pick up.  Left voicemail.  Author: Berle Mull, MD 02/14/2022 6:06 PM For on call review [email or web address].

## 2022-02-14 NOTE — ED Triage Notes (Signed)
Arrives via ems from Rehab with cough and SOB for 3 weeks. Facility staff reported Spo2 of 84%, on 6L [place] Spo2 93%. Received 650 MG tylenol PO, 468m N/S,

## 2022-02-14 NOTE — ED Notes (Signed)
Pt resting comfortably, drank 8 oz cup of water, keeps eyes closed but talks to and answers questions.

## 2022-02-15 DIAGNOSIS — J9601 Acute respiratory failure with hypoxia: Secondary | ICD-10-CM | POA: Diagnosis not present

## 2022-02-15 LAB — COMPREHENSIVE METABOLIC PANEL
ALT: 22 U/L (ref 0–44)
AST: 26 U/L (ref 15–41)
Albumin: 2.7 g/dL — ABNORMAL LOW (ref 3.5–5.0)
Alkaline Phosphatase: 41 U/L (ref 38–126)
Anion gap: 5 (ref 5–15)
BUN: 15 mg/dL (ref 8–23)
CO2: 25 mmol/L (ref 22–32)
Calcium: 8.2 mg/dL — ABNORMAL LOW (ref 8.9–10.3)
Chloride: 108 mmol/L (ref 98–111)
Creatinine, Ser: 1.25 mg/dL — ABNORMAL HIGH (ref 0.44–1.00)
GFR, Estimated: 44 mL/min — ABNORMAL LOW (ref >60)
Glucose, Bld: 105 mg/dL — ABNORMAL HIGH (ref 70–99)
Potassium: 4 mmol/L (ref 3.5–5.1)
Sodium: 138 mmol/L (ref 135–145)
Total Bilirubin: 0.4 mg/dL (ref 0.3–1.2)
Total Protein: 5.4 g/dL — ABNORMAL LOW (ref 6.5–8.1)

## 2022-02-15 LAB — CBC
HCT: 43.1 % (ref 36.0–46.0)
Hemoglobin: 14 g/dL (ref 12.0–15.0)
MCH: 30.9 pg (ref 26.0–34.0)
MCHC: 32.5 g/dL (ref 30.0–36.0)
MCV: 95.1 fL (ref 80.0–100.0)
Platelets: 218 10*3/uL (ref 150–400)
RBC: 4.53 MIL/uL (ref 3.87–5.11)
RDW: 14.9 % (ref 11.5–15.5)
WBC: 12.4 10*3/uL — ABNORMAL HIGH (ref 4.0–10.5)
nRBC: 0 % (ref 0.0–0.2)

## 2022-02-15 LAB — LACTIC ACID, PLASMA: Lactic Acid, Venous: 0.7 mmol/L (ref 0.5–1.9)

## 2022-02-15 MED ORDER — IPRATROPIUM-ALBUTEROL 0.5-2.5 (3) MG/3ML IN SOLN
3.0000 mL | Freq: Three times a day (TID) | RESPIRATORY_TRACT | Status: DC
  Administered 2022-02-15 – 2022-02-17 (×6): 3 mL via RESPIRATORY_TRACT
  Filled 2022-02-15 (×6): qty 3

## 2022-02-15 MED ORDER — HYDROCOD POLI-CHLORPHE POLI ER 10-8 MG/5ML PO SUER: 5.0000 mL | Freq: Every evening | ORAL | Status: DC | PRN

## 2022-02-15 MED ORDER — IPRATROPIUM BROMIDE 0.02 % IN SOLN
RESPIRATORY_TRACT | Status: AC
  Filled 2022-02-15: qty 2.5

## 2022-02-15 MED ORDER — ALBUTEROL SULFATE (2.5 MG/3ML) 0.083% IN NEBU: 2.5000 mg | INHALATION_SOLUTION | Freq: Four times a day (QID) | RESPIRATORY_TRACT | Status: DC | PRN

## 2022-02-15 MED ORDER — ALBUTEROL SULFATE (2.5 MG/3ML) 0.083% IN NEBU
INHALATION_SOLUTION | RESPIRATORY_TRACT | Status: AC
  Filled 2022-02-15: qty 3

## 2022-02-15 NOTE — Hospital Course (Signed)
Jamie George is a 76 y.o. female with PMH significant of dementia, 1 disorder, HTN, GERD, depression, HLD.  Currently being treated for influenza in the hospital.

## 2022-02-15 NOTE — Evaluation (Signed)
Physical Therapy Evaluation Patient Details Name: Jamie George MRN: [id number] DOB: [birth year] Today's Date: 02/15/2022  History of Present Illness  Patient is a 76 year old female with PMH significant of dementia, HTN, GERD, depression, HLD. She presented from [facility] with cough and shortness of breath. Found to be positive for influenza, initially requiring 6L02.   Clinical Impression  Patient is agreeable to PT evaluation. She lives at [facility] and does not typically receive therapy. She reports she is independent with mobility and ambulates with her rolling walker. She has assistance for ADLs at baseline.   Today, the patient needs assistance for bed mobility and to stand from the bed. Activity tolerance is limited with standing tolerance of less than 1 minute. Ambulation not attempted due to fatigue with activity. Vitals stable throughout session with increased coughing noted with mobility. Recommend PT follow up to maximize independence and decrease caregiver burden. Recommend to return to SNF with physical therapy if possible at discharge.    Recommendations for follow up therapy are one component of a multi-disciplinary discharge planning process, led by the attending physician.  Recommendations may be updated based on patient status, additional functional criteria and insurance authorization.  Follow Up Recommendations Skilled nursing-short term rehab (<3 hours/day) Can patient physically be transported by private vehicle: No    Assistance Recommended at Discharge Frequent or constant Supervision/Assistance  Patient can return home with the following  A lot of help with walking and/or transfers;A lot of help with bathing/dressing/bathroom;Assist for transportation;Help with stairs or ramp for entrance    Equipment Recommendations  (to be determined at next level of care)  Recommendations for Other Services       Functional Status Assessment Patient has had a recent  decline in their functional status and demonstrates the ability to make significant improvements in function in a reasonable and predictable amount of time.     Precautions / Restrictions Precautions Precautions: Fall Restrictions Weight Bearing Restrictions: No      Mobility  Bed Mobility Overal bed mobility: Needs Assistance Bed Mobility: Supine to Sit, Sit to Supine     Supine to sit: Mod assist Sit to supine: Max assist   General bed mobility comments: assistance for trunk and BLE support. verbal cues for sequencing and technique    Transfers Overall transfer level: Needs assistance Equipment used: Rolling walker (2 wheels) Transfers: Sit to/from Stand Sit to Stand: Mod assist           General transfer comment: lifting and lowering assistance provided for transfers. anterior weight shifting assistance provided also. cues for technique    Ambulation/Gait               General Gait Details: not assessed due to poor standing tolerance, fatigue with minimal activity  Stairs            Wheelchair Mobility    Modified Rankin (Stroke Patients Only)       Balance Overall balance assessment: Needs assistance Sitting-balance support: Feet supported Sitting balance-Leahy Scale: Fair   Postural control: Posterior lean Standing balance support: Bilateral upper extremity supported, Reliant on assistive device for balance Standing balance-Leahy Scale: Poor Standing balance comment: faciliation for anterior weight shifting                             Pertinent Vitals/Pain Pain Assessment Pain Assessment: Faces Faces Pain Scale: Hurts a little bit Pain Location: throat Pain Descriptors / Indicators: Sore  Pain Intervention(s): Monitored during session    Home Living Family/patient expects to be discharged to:: Skilled nursing facility                   Additional Comments: she reports she does not get therapy there    Prior  Function Prior Level of Function : Needs assist             Mobility Comments: patient reports she is independent with mobility using rolling walker for ambulation ADLs Comments: assistance for bathing, dressing, medication, food     Hand Dominance        Extremity/Trunk Assessment   Upper Extremity Assessment Upper Extremity Assessment: Generalized weakness    Lower Extremity Assessment Lower Extremity Assessment: Generalized weakness       Communication   Communication: No difficulties  Cognition Arousal/Alertness: Awake/alert Behavior During Therapy: WFL for tasks assessed/performed Overall Cognitive Status: History of cognitive impairments - at baseline                                 General Comments: Patient with limited short term memory (asking the same question multiple times during session). She is able to follow single step commands with increased time.        General Comments General comments (skin integrity, edema, etc.): tremor noted once standing. standing tolerance of less than 1 minute. vitals stable throughout with increased coughing intermittently with activity    Exercises     Assessment/Plan    PT Assessment Patient needs continued PT services  PT Problem List Decreased strength;Decreased range of motion;Decreased activity tolerance;Decreased balance;Decreased mobility;Cardiopulmonary status limiting activity;Decreased cognition;Decreased knowledge of use of DME;Decreased safety awareness       PT Treatment Interventions DME instruction;Gait training;Stair training;Functional mobility training;Balance training;Therapeutic exercise;Therapeutic activities;Neuromuscular re-education;Cognitive remediation;Patient/family education    PT Goals (Current goals can be found in the Care Plan section)  Acute Rehab PT Goals Patient Stated Goal: to return to [facility] PT Goal Formulation: With patient Time For Goal Achievement:  03/01/22 Potential to Achieve Goals: Good    Frequency Min 3X/week     Co-evaluation               AM-PAC PT "6 Clicks" Mobility  Outcome Measure Help needed turning from your back to your side while in a flat bed without using bedrails?: A Lot Help needed moving from lying on your back to sitting on the side of a flat bed without using bedrails?: A Lot Help needed moving to and from a bed to a chair (including a wheelchair)?: A Lot Help needed standing up from a chair using your arms (e.g., wheelchair or bedside chair)?: A Lot Help needed to walk in hospital room?: A Lot Help needed climbing 3-5 steps with a railing? : A Lot 6 Click Score: 12    End of Session Equipment Utilized During Treatment: Oxygen Activity Tolerance: Patient tolerated treatment well Patient left: in bed;with call bell/phone within reach;with bed alarm set Nurse Communication: Other (comment) (patient asking for vanilla ensure) PT Visit Diagnosis: Unsteadiness on feet (R26.81);Muscle weakness (generalized) (M62.81)    Time: 1093-2355 PT Time Calculation (min) (ACUTE ONLY): 23 min   Charges:   PT Evaluation $PT Eval Low Complexity: 1 Low PT Treatments $Therapeutic Activity: 8-22 mins        Minna Merritts, PT, MPT   Percell Locus 02/15/2022, 2:37 PM

## 2022-02-15 NOTE — Plan of Care (Signed)
Patient has pressure injury to right buttock, foam applied and documented in [facility].   Problem: Education: Goal: Knowledge of General Education information will improve Description: Including pain rating scale, medication(s)/side effects and non-pharmacologic comfort measures Outcome: Progressing   Problem: Activity: Goal: Risk for activity intolerance will decrease Outcome: Progressing   Problem: Pain Managment: Goal: General experience of comfort will improve Outcome: Progressing   Problem: Safety: Goal: Ability to remain free from injury will improve Outcome: Progressing   Problem: Skin Integrity: Goal: Risk for impaired skin integrity will decrease Outcome: Progressing

## 2022-02-15 NOTE — Progress Notes (Signed)
[facility] Progress Note Patient: Jamie George ALP:[id number] DOB: August 20, 1945 DOA: 02/14/2022  DOS: the patient was seen and examined on 02/15/2022  Brief hospital course:  Jamie George is a 76 y.o. female with PMH significant of dementia, 1 disorder, HTN, GERD, depression, HLD.  Currently being treated for influenza in the hospital. Assessment and Plan: Acute hypoxic respiratory failure Influenza A infection. Lactic acidosis Patient presents with complaints of cough and shortness of breath.  84% on room air.  Initially on 6 L of oxygen but saturating 89%.  Currently on 2 L of oxygen. Does not use oxygen at baseline. Influenza A is positive.  CT PE protocol negative for pulmonary embolism. No significant consolidation.  COVID-negative. Continue Tamiflu continue nebulizer therapy, Mucinex, Delsym, Tessalon Perles.  For now holding steroids. Patient does have oxygen order, 2 LPM as needed but has not used oxygen prior to today at the facility. Lactic acid Mostly elevated due to increased work of breathing.   Hypertension On valsartan at home. Will hold for now.   Chronic kidney disease, stage 3b with mild renal insufficiency. Baseline serum creatinine around 1.3. On admission serum creatinine 1.57. Not meeting AKI criteria but worsened from baseline. Treated with gentle IV fluids.  Will hold now.   Dementia ([place]) Mood disorder ([place]) Patient is on Aricept, Namenda, Risperdal, Remeron and Zonegran prior to admission. Will continue all for now. Zonegran has not been recommended to be used in CrCl less than 50.  May need to be adjusted by PCP down the road.   Hyperlipidemia Continue statin.   Lactic acidosis From increased work of breathing. Received IV fluid.  Monitor.   8 mm left pulmonary nodule. Noncontrast chest CT in 3 to 6 months is recommended for a follow-up. Although given her dementia need to discuss with family regarding the utility of the study  before proceeding.  Stage II right buttock pressure ulcer. Present on admission retention continue foam dressing.   Subjective: No nausea no vomiting.  Continues to have cough.  Continues to have shortness of breath.  No chest pain.  Physical Exam: General: in mild distress;  Cardiovascular: S1 and S2 Present, no Murmur Respiratory: increased respiratory effort, Bilateral Air entry present, no Crackles, improving bilateral wheezes Abdomen: Bowel Sound present, Non tender  Extremities: no edema Neurology: alert and oriented to time, place, and person   Data Reviewed: I have Reviewed nursing notes, Vitals, and Lab results. Since last encounter, pertinent lab results CBC and BMP   . I have ordered test including CBC and BMP  .   Disposition: Status is: Inpatient Remains inpatient appropriate because: Still hypoxic requiring oxygen therapy  heparin injection 5,000 Units Start: 02/14/22 2200   Family Communication: No one at bedside Level of care: Telemetry Medical Continue telemetry.  Switch to MedSurg tomorrow as long as stable. Vitals:   02/15/22 0700 02/15/22 0740 02/15/22 0832 02/15/22 1459  BP: (!) 152/83 (!) 157/79 (!) 141/75 (!) 143/79  Pulse: 79 87 78 76  Resp: 15 (!) 24 18   Temp:  99.1 F (37.3 C) 98.5 F (36.9 C) 98.8 F (37.1 C)  TempSrc:  Oral Oral Oral  SpO2: 95% 95% 94% 94%  Height:         Author: Berle Mull, MD 02/15/2022 5:45 PM  Please look on [email or web address] to find out who is on call.

## 2022-02-15 NOTE — NC FL2 (Signed)
[place] LEVEL OF CARE FORM     IDENTIFICATION  Patient Name: Jamie George Birthdate: October 20, 1945 Sex: female Admission Date (Current Location): 02/14/2022  [facility] and [place] Number:  Herbalist and Address:  [facility], [place] [address] [place], [place] [address]      Provider Number: [id number]  Attending Physician Name and Address:  Lavina Hamman, MD  Relative Name and Phone Number:  [name] Daughter   [phone]    Current Level of Care: Hospital Recommended Level of Care: [place] Prior Approval Number:    Date Approved/Denied:   PASRR Number: [id number]  Discharge Plan: SNF    Current Diagnoses: Patient Active Problem List   Diagnosis Date Noted   Acute hypoxic respiratory failure ([place]) 02/14/2022   Mood disorder ([place]) 02/14/2022   Hyperlipidemia 02/14/2022   Lactic acidosis 02/14/2022   8 mm left pulmonary nodule 02/14/2022   Pneumonia 11/29/2019   Dyspnea 07/30/2019   Dementia ([place]) 01/02/2018   Aggression 01/02/2018   Dissection of abdominal aorta ([place]) 08/13/2017   Chest pain 08/13/2017   Hypertension 08/13/2017   Chronic kidney disease, stage 3b ([place]) 08/13/2017   Anemia 08/13/2017    Orientation RESPIRATION BLADDER Height & Weight     Self, Time, Situation, Place  O2 External catheter, Incontinent Weight:   Height:  '5\' 6"'$  (167.6 cm)  BEHAVIORAL SYMPTOMS/MOOD NEUROLOGICAL BOWEL NUTRITION STATUS      Continent Diet (see discharge summary)  AMBULATORY STATUS COMMUNICATION OF NEEDS Skin   Extensive Assist Verbally Other (Comment) (pressure injury to right buttock)                       Personal Care Assistance Level of Assistance  Bathing, Feeding, Dressing Bathing Assistance: Limited assistance Feeding assistance: Limited assistance Dressing Assistance: Limited assistance     Functional Limitations Info  Sight, Hearing, Speech Sight Info:  Adequate Hearing Info: Adequate Speech Info: Adequate    SPECIAL CARE FACTORS FREQUENCY  PT (By licensed PT), OT (By licensed OT)     PT Frequency: 5x week OT Frequency: 5x week            Contractures Contractures Info: Not present    Additional Factors Info  Code Status, Allergies Code Status Info: DNR Allergies Info: NKA           Current Medications (02/15/2022):  This is the current hospital active medication list Current Facility-Administered Medications  Medication Dose Route Frequency Provider Last Rate Last Admin   acetaminophen (TYLENOL) tablet 650 mg  650 mg Oral Q6H PRN Lavina Hamman, MD       Or   acetaminophen (TYLENOL) suppository 650 mg  650 mg Rectal Q6H PRN Lavina Hamman, MD       albuterol (PROVENTIL) (2.5 MG/3ML) 0.083% nebulizer solution            aspirin EC tablet 81 mg  81 mg Oral Daily Lavina Hamman, MD   81 mg at 02/15/22 0947   [START ON 02/16/2022] atorvastatin (LIPITOR) tablet 40 mg  40 mg Oral Once per day on Sun Thu Patel, Pranav M, MD       benzonatate (TESSALON) capsule 100 mg  100 mg Oral TID Lavina Hamman, MD   100 mg at 02/15/22 1427   chlorpheniramine-HYDROcodone (TUSSIONEX) 10-8 MG/5ML suspension 5 mL  5 mL Oral QHS PRN Lavina Hamman, MD  dextromethorphan (DELSYM) 30 MG/5ML liquid 30 mg  30 mg Oral BID Lavina Hamman, MD   30 mg at 02/15/22 1057   donepezil (ARICEPT) tablet 10 mg  10 mg Oral QHS Lavina Hamman, MD   10 mg at 02/14/22 1958   escitalopram (LEXAPRO) tablet 20 mg  20 mg Oral q AM Lavina Hamman, MD   20 mg at 02/15/22 0742   guaiFENesin (MUCINEX) 12 hr tablet 600 mg  600 mg Oral BID Lavina Hamman, MD   600 mg at 02/15/22 0947   heparin injection 5,000 Units  5,000 Units Subcutaneous Q8H Lavina Hamman, MD   5,000 Units at 02/15/22 1427   ipratropium (ATROVENT) 0.02 % nebulizer solution            ipratropium-albuterol (DUONEB) 0.5-2.5 (3) MG/3ML nebulizer solution 3 mL  3 mL Nebulization TID Lavina Hamman, MD       memantine [facility]) tablet 10 mg  10 mg Oral BID Lavina Hamman, MD   10 mg at 02/15/22 0742   mirtazapine (REMERON) tablet 7.5 mg  7.5 mg Oral QHS Lavina Hamman, MD   7.5 mg at 02/14/22 1959   ondansetron (ZOFRAN) tablet 4 mg  4 mg Oral Q6H PRN Lavina Hamman, MD       Or   ondansetron [facility]) injection 4 mg  4 mg Intravenous Q6H PRN Lavina Hamman, MD       oseltamivir (TAMIFLU) capsule 30 mg  30 mg Oral BID Lavina Hamman, MD   30 mg at 02/15/22 1057   risperiDONE (RISPERDAL) tablet 1 mg  1 mg Oral QHS Lavina Hamman, MD   1 mg at 02/14/22 2000   senna-docusate (Senokot-S) tablet 1 tablet  1 tablet Oral QHS PRN Lavina Hamman, MD       zonisamide [facility]) capsule 200 mg  200 mg Oral QHS Lavina Hamman, MD   200 mg at 02/14/22 2031     Discharge Medications: Please see discharge summary for a list of discharge medications.  Relevant Imaging Results:  Relevant Lab Results:   Additional Information [id number]  Joanne Chars, LCSW

## 2022-02-15 NOTE — ED Notes (Signed)
Patient is resting comfortably. 

## 2022-02-15 NOTE — Plan of Care (Signed)
  Problem: Education: Goal: Knowledge of General Education information will improve Description: Including pain rating scale, medication(s)/side effects and non-pharmacologic comfort measures Outcome: Progressing   Problem: Health Behavior/Discharge Planning: Goal: Ability to manage health-related needs will improve Outcome: Progressing   Problem: Clinical Measurements: Goal: Ability to maintain clinical measurements within normal limits will improve Outcome: Progressing Goal: Will remain free from infection Outcome: Progressing Goal: Respiratory complications will improve Outcome: Progressing   Problem: Activity: Goal: Risk for activity intolerance will decrease Outcome: Progressing   Problem: Nutrition: Goal: Adequate nutrition will be maintained Outcome: Progressing   

## 2022-02-15 NOTE — TOC Initial Note (Signed)
Transition of Care [facility]) - Initial/Assessment Note    Patient Details  Name: Jamie George MRN: [id number] Date of Birth: 27-Jun-1945  [facility]) CM/SW Contact:    Joanne Chars, LCSW Phone Number: 02/15/2022, 3:50 PM  Clinical Narrative:    CSW met with pt regarding DC recommendation for SNF.  Pt reports she is LTC resident at [facility].  Discussed PT recommending more PT at [place].  Pt does plan to return to [facility] at [place], would be fine with participating in PT at that facility.  Permission given to speak with daughter [name], son Yong Channel.  CSW awaiting response from [name] to confirm pt will return.    CSW LM with pt daughter [name].               Expected Discharge Plan: [place] Barriers to Discharge: Continued Medical Work up   Patient Goals and CMS Choice Patient states their goals for this hospitalization and ongoing recovery are:: be able to live without oxygen   Choice offered to / list presented to : Patient      Expected Discharge Plan and Services In-house Referral: Clinical Social Work   Post Acute Care Choice: [place] Living arrangements for the past 2 months: [place] (LTC at [place])                                      Prior Living Arrangements/Services Living arrangements for the past 2 months: [place] (LTC at [place]) Lives with:: Facility Resident Patient language and need for interpreter reviewed:: Yes Do you feel safe going back to the place where you live?: Yes      Need for Family Participation in Patient Care: Yes (Comment) Care giver support system in place?: Yes (comment) Current home services: Other (comment) (na) Criminal Activity/Legal Involvement Pertinent to Current Situation/Hospitalization: No - Comment as needed  Activities of Daily Living      Permission Sought/Granted Permission sought to share  information with : Family Supports Permission granted to share information with : Yes, Verbal Permission Granted  Share Information with NAME: daughter [name], son Yong Channel  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        Emotional Assessment Appearance:: Appears stated age Attitude/Demeanor/Rapport: Engaged Affect (typically observed): Appropriate Orientation: : Oriented to Place, Oriented to Self, Oriented to  Time, Oriented to Situation      Admission diagnosis:  Influenza A [J10.1] Acute respiratory failure with hypoxemia ([place]) [J96.01] Acute hypoxic respiratory failure ([place]) [J96.01] Patient Active Problem List   Diagnosis Date Noted   Acute hypoxic respiratory failure ([place]) 02/14/2022   Mood disorder ([place]) 02/14/2022   Hyperlipidemia 02/14/2022   Lactic acidosis 02/14/2022   8 mm left pulmonary nodule 02/14/2022   Pneumonia 11/29/2019   Dyspnea 07/30/2019   Dementia ([place]) 01/02/2018   Aggression 01/02/2018   Dissection of abdominal aorta ([place]) 08/13/2017   Chest pain 08/13/2017   Hypertension 08/13/2017   Chronic kidney disease, stage 3b ([place]) 08/13/2017   Anemia 08/13/2017   PCP:  Kristen Loader, FNP Pharmacy:  No Pharmacies Listed    Social Determinants of Health (SDOH) Social History: SDOH Screenings   Tobacco Use: Medium Risk (01/27/2021)   SDOH Interventions:     Readmission Risk Interventions     No data to display

## 2022-02-16 DIAGNOSIS — J9601 Acute respiratory failure with hypoxia: Secondary | ICD-10-CM | POA: Diagnosis not present

## 2022-02-16 MED ORDER — PHENOL 1.4 % MT LIQD
1.0000 | OROMUCOSAL | Status: DC | PRN
  Administered 2022-02-16: 1 via OROMUCOSAL
  Filled 2022-02-16: qty 177

## 2022-02-16 MED ORDER — METHYLPREDNISOLONE SODIUM SUCC 40 MG IJ SOLR
40.0000 mg | Freq: Every day | INTRAMUSCULAR | Status: AC
  Administered 2022-02-16 – 2022-02-17 (×2): 40 mg via INTRAVENOUS
  Filled 2022-02-16 (×2): qty 1

## 2022-02-16 MED ORDER — LIP MEDEX EX OINT
TOPICAL_OINTMENT | CUTANEOUS | Status: DC | PRN
  Administered 2022-02-16 (×2): 1 via TOPICAL
  Filled 2022-02-16: qty 7

## 2022-02-16 MED ORDER — PREDNISONE 20 MG PO TABS
40.0000 mg | ORAL_TABLET | Freq: Every day | ORAL | Status: DC
  Administered 2022-02-18: 40 mg via ORAL
  Filled 2022-02-16: qty 2

## 2022-02-16 MED ORDER — HYDROCOD POLI-CHLORPHE POLI ER 10-8 MG/5ML PO SUER
5.0000 mL | Freq: Two times a day (BID) | ORAL | Status: DC
  Administered 2022-02-16 – 2022-02-18 (×5): 5 mL via ORAL
  Filled 2022-02-16 (×5): qty 5

## 2022-02-16 MED ORDER — GUAIFENESIN ER 600 MG PO TB12
600.0000 mg | ORAL_TABLET | Freq: Once | ORAL | Status: AC
  Administered 2022-02-16: 600 mg via ORAL
  Filled 2022-02-16: qty 1

## 2022-02-16 MED ORDER — GUAIFENESIN ER 600 MG PO TB12
1200.0000 mg | ORAL_TABLET | Freq: Two times a day (BID) | ORAL | Status: DC
  Administered 2022-02-16 – 2022-02-18 (×4): 1200 mg via ORAL
  Filled 2022-02-16 (×4): qty 2

## 2022-02-16 NOTE — Progress Notes (Signed)
[facility] Progress Note Patient: Jamie George DZH:[id number] DOB: 10/06/1945 DOA: 02/14/2022  DOS: the patient was seen and examined on 02/16/2022  Brief hospital course:  Jamie George is a 76 y.o. female with PMH significant of dementia, 1 disorder, HTN, GERD, depression, HLD.  Currently being treated for influenza in the hospital. Assessment and Plan: Acute hypoxic respiratory failure Influenza A infection. Lactic acidosis Patient presents with complaints of cough and shortness of breath.  84% on room air.  Initially on 6 L of oxygen but saturating 89%.  Currently on 2 L of oxygen. Earlier dropped to 77% on room air with RN but when working with therapy was able to maintain 89% on room air. Does not use oxygen at baseline. Influenza A is positive.  CT PE protocol negative for pulmonary embolism. No significant consolidation.  COVID-negative. Continue Tamiflu continue nebulizer therapy, Mucinex, Delsym, Tessalon Perles.  For now holding steroids. Patient does have oxygen order, 2 LPM as needed but has not used oxygen prior to today at the facility.   Lactic acidosis  Mostly elevated due to increased work of breathing.   Hypertension On valsartan at home. Will hold for now.   Chronic kidney disease, stage 3b with mild renal insufficiency. Baseline serum creatinine around 1.3. On admission serum creatinine 1.57. Not meeting AKI criteria but worsened from baseline. Treated with gentle IV fluids.  Now serum creatinine at baseline.   Dementia ([place]) Mood disorder ([place]) Patient is on Aricept, Namenda, Risperdal, Remeron and Zonegran prior to admission. Will continue all for now. Zonegran has not been recommended to be used in CrCl less than 50.  May need to be adjusted by PCP down the road.   Hyperlipidemia Continue statin.   Lactic acidosis From increased work of breathing. Received IV fluid.  Monitor.   8 mm left pulmonary nodule. Noncontrast chest CT in  3 to 6 months is recommended for a follow-up. Although given her dementia need to discuss with family regarding the utility of the study before proceeding.  Stage II right buttock pressure ulcer. Present on admission retention continue foam dressing.   Subjective: Continues to have shortness of breath.  Continues to have cough.  No nausea no vomiting no fever no chills.  No chest pain.  Physical Exam: General: Appear in mild distress; Cardiovascular: S1 and S2 Present, no Murmur, Respiratory: good respiratory effort, Bilateral Air entry present, bilateral  Crackles, bilateral  wheezes Abdomen: Bowel Sound present, Non tender  Extremities: no Pedal edema Neurology: alert and oriented to time, place, and person   Data Reviewed: I have Reviewed nursing notes, Vitals, and Lab results. I have ordered test including CBC and BMP.    Disposition: Status is: Inpatient Remains inpatient appropriate because: Still hypoxic requiring oxygen therapy  heparin injection 5,000 Units Start: 02/14/22 2200   Family Communication: No one at bedside Level of care: Telemetry Medical switch to [place]. Vitals:   02/16/22 0819 02/16/22 0823 02/16/22 0826 02/16/22 1508  BP: (!) 142/66   115/71  Pulse:    80  Resp: 14     Temp: 98.5 F (36.9 C)   98.1 F (36.7 C)  TempSrc: Oral   Oral  SpO2: (!) 89% 91% 91% 91%  Height:         Author: Berle Mull, MD 02/16/2022 5:58 PM  Please look on [email or web address] to find out who is on call.

## 2022-02-16 NOTE — Progress Notes (Signed)
Occupational Therapy Evaluation Patient Details Name: Jamie George MRN: [id number] DOB: [birth year] Today's Date: 02/16/2022   History of Present Illness Patient is a 76 year old female with PMH significant of dementia, HTN, GERD, depression, HLD. She presented from [facility] with cough and shortness of breath. Found to be positive for influenza, initially requiring 6L02.   Clinical Impression   PTA, pt from LTC, typically Modified Independent with mobility using RW and receives assist for ADLs due to cognitive deficits. Pt presents now with deficits in standing balance and cardiopulmonary tolerance. Pt initially thought it was 8PM on entry but easily reoriented and engaged in tasks after being assisted to order breakfast. Overall, pt requires Min A for transfers/short mobility with RW and assist to maneuver DME. Pt requires Min A for UB ADL and Min-Mod A for LB ADLs w/ step by step sequencing cues needed. Emphasis on breathing techniques/control w/ activity. SpO2 88-90% on RA with activity. Rec DC back to SNF w/ therapy follow up as needed.      Recommendations for follow up therapy are one component of a multi-disciplinary discharge planning process, led by the attending physician.  Recommendations may be updated based on patient status, additional functional criteria and insurance authorization.   Follow Up Recommendations  Skilled nursing-short term rehab (<3 hours/day)     Assistance Recommended at Discharge Frequent or constant Supervision/Assistance  Patient can return home with the following A little help with walking and/or transfers;A little help with bathing/dressing/bathroom    Functional Status Assessment  Patient has had a recent decline in their functional status and demonstrates the ability to make significant improvements in function in a reasonable and predictable amount of time.  Equipment Recommendations  None recommended by OT    Recommendations for Other  Services       Precautions / Restrictions Precautions Precautions: Fall Precaution Comments: monitor O2 Restrictions Weight Bearing Restrictions: No      Mobility Bed Mobility Overal bed mobility: Needs Assistance Bed Mobility: Supine to Sit     Supine to sit: Min assist, HOB elevated     General bed mobility comments: assist to lift trunk, step by step cues needed to sequence    Transfers Overall transfer level: Needs assistance Equipment used: Rolling walker (2 wheels) Transfers: Sit to/from Stand, Bed to chair/wheelchair/BSC Sit to Stand: Min guard     Step pivot transfers: Min assist     General transfer comment: assist to direct RW to recliner for breakfast      Balance Overall balance assessment: Needs assistance Sitting-balance support: Feet supported Sitting balance-Leahy Scale: Fair     Standing balance support: Bilateral upper extremity supported, Reliant on assistive device for balance Standing balance-Leahy Scale: Poor                             ADL either performed or assessed with clinical judgement   ADL Overall ADL's : Needs assistance/impaired Eating/Feeding: Set up;Sitting   Grooming: Supervision/safety;Sitting   Upper Body Bathing: Minimal assistance;Sitting   Lower Body Bathing: Sit to/from stand;Moderate assistance Lower Body Bathing Details (indicate cue type and reason): assist to bathe peri region in standing Upper Body Dressing : Minimal assistance   Lower Body Dressing: Minimal assistance;Sit to/from stand Lower Body Dressing Details (indicate cue type and reason): able to don socks with step by step cues Toilet Transfer: Minimal assistance;Ambulation;Regular Toilet;Rolling walker (2 wheels)   Toileting- Clothing Manipulation and Hygiene: Moderate assistance;Sit  to/from stand;Sitting/lateral lean       Functional mobility during ADLs: Minimal assistance;Rolling walker (2 wheels);Cueing for sequencing;Cueing for  safety General ADL Comments: cues for breathing techniques, rest breaks and monitoring of O2     Vision Baseline Vision/History: 1 Wears glasses (reading glasses) Ability to See in Adequate Light: 1 Impaired Patient Visual Report: No change from baseline Vision Assessment?: No apparent visual deficits     Perception     Praxis      Pertinent Vitals/Pain Pain Assessment Pain Assessment: No/denies pain     Hand Dominance Right   Extremity/Trunk Assessment Upper Extremity Assessment Upper Extremity Assessment: Generalized weakness   Lower Extremity Assessment Lower Extremity Assessment: Defer to PT evaluation   Cervical / Trunk Assessment Cervical / Trunk Assessment: Normal   Communication Communication Communication: No difficulties   Cognition Arousal/Alertness: Awake/alert Behavior During Therapy: WFL for tasks assessed/performed Overall Cognitive Status: History of cognitive impairments - at baseline                                 General Comments: hx of dementia, follows sequencing cues and one step directions with familiar tasks well. On entry, pt looking at menu, requesting assist to order food. Pt thought it was 8PM and confused when OT initially asked what she wanted for breakfast. easily redirectable and pleasant, able to express needs and report she was at the hospital due to difficulty breathing     General Comments  SpO2 93% on 1 L O2, 88-90% on RA with activity    Exercises     Shoulder Instructions      Home Living Family/patient expects to be discharged to:: Skilled nursing facility                                 Additional Comments: from [place]      Prior Functioning/Environment Prior Level of Function : Needs assist             Mobility Comments: patient reports she is independent with mobility using rolling walker for ambulation ADLs Comments: assistance for bathing, dressing, medication, food         OT Problem List: Decreased strength;Impaired balance (sitting and/or standing);Decreased activity tolerance;Cardiopulmonary status limiting activity;Decreased cognition;Decreased knowledge of use of DME or AE      OT Treatment/Interventions: Self-care/ADL training;Therapeutic exercise;Energy conservation;DME and/or AE instruction;Therapeutic activities;Patient/family education    OT Goals(Current goals can be found in the care plan section) Acute Rehab OT Goals Patient Stated Goal: get some breakfast OT Goal Formulation: With patient Time For Goal Achievement: 03/02/22 Potential to Achieve Goals: Good  OT Frequency: Min 2X/week    Co-evaluation              AM-PAC OT "6 Clicks" Daily Activity     Outcome Measure Help from another person eating meals?: A Little Help from another person taking care of personal grooming?: A Little Help from another person toileting, which includes using toliet, bedpan, or urinal?: A Lot Help from another person bathing (including washing, rinsing, drying)?: A Lot Help from another person to put on and taking off regular upper body clothing?: A Little Help from another person to put on and taking off regular lower body clothing?: A Little 6 Click Score: 16   End of Session Equipment Utilized During Treatment: Rolling walker (2 wheels) Nurse Communication: Mobility  status  Activity Tolerance: Patient tolerated treatment well Patient left: in chair;with call bell/phone within reach;with chair alarm set;with nursing/sitter in room  OT Visit Diagnosis: Unsteadiness on feet (R26.81);Other abnormalities of gait and mobility (R26.89)                Time: 6629-4765 OT Time Calculation (min): 23 min Charges:  OT General Charges $OT Visit: 1 Visit OT Evaluation $OT Eval Moderate Complexity: 1 Mod  Malachy Chamber, OTR/L Acute Rehab Services Office: [phone]   Layla Maw 02/16/2022, 8:25 AM

## 2022-02-16 NOTE — Plan of Care (Signed)

## 2022-02-17 DIAGNOSIS — J9601 Acute respiratory failure with hypoxia: Secondary | ICD-10-CM | POA: Diagnosis not present

## 2022-02-17 DIAGNOSIS — N1832 Chronic kidney disease, stage 3b: Secondary | ICD-10-CM | POA: Diagnosis not present

## 2022-02-17 DIAGNOSIS — F03A0 Unspecified dementia, mild, without behavioral disturbance, psychotic disturbance, mood disturbance, and anxiety: Secondary | ICD-10-CM

## 2022-02-17 LAB — CBC
HCT: 45 % (ref 36.0–46.0)
Hemoglobin: 14 g/dL (ref 12.0–15.0)
MCH: 29.9 pg (ref 26.0–34.0)
MCHC: 31.1 g/dL (ref 30.0–36.0)
MCV: 96.2 fL (ref 80.0–100.0)
Platelets: 210 10*3/uL (ref 150–400)
RBC: 4.68 MIL/uL (ref 3.87–5.11)
RDW: 14.4 % (ref 11.5–15.5)
WBC: 9.6 10*3/uL (ref 4.0–10.5)
nRBC: 0 % (ref 0.0–0.2)

## 2022-02-17 LAB — BASIC METABOLIC PANEL
Anion gap: 9 (ref 5–15)
BUN: 26 mg/dL — ABNORMAL HIGH (ref 8–23)
CO2: 23 mmol/L (ref 22–32)
Calcium: 8.6 mg/dL — ABNORMAL LOW (ref 8.9–10.3)
Chloride: 107 mmol/L (ref 98–111)
Creatinine, Ser: 0.91 mg/dL (ref 0.44–1.00)
GFR, Estimated: >60 mL/min (ref >60)
Glucose, Bld: 78 mg/dL (ref 70–99)
Potassium: 4.4 mmol/L (ref 3.5–5.1)
Sodium: 139 mmol/L (ref 135–145)

## 2022-02-17 LAB — MAGNESIUM: Magnesium: 2.1 mg/dL (ref 1.7–2.4)

## 2022-02-17 MED ORDER — IPRATROPIUM-ALBUTEROL 0.5-2.5 (3) MG/3ML IN SOLN
3.0000 mL | Freq: Two times a day (BID) | RESPIRATORY_TRACT | Status: DC
  Administered 2022-02-17 – 2022-02-18 (×2): 3 mL via RESPIRATORY_TRACT
  Filled 2022-02-17 (×2): qty 3

## 2022-02-17 NOTE — Plan of Care (Signed)

## 2022-02-17 NOTE — TOC Progression Note (Signed)
Transition of Care [facility]) - Progression Note    Patient Details  Name: Jamie George MRN: [id number] Date of Birth: 1946-02-11  Transition [facility]) CM/SW Contact  Joanne Chars, LCSW Phone Number: 02/17/2022, 10:33 AM  Clinical Narrative:   Per MD, pt potential DC tomorrow.  CSW confirmed with [name] that pt can return tomorrow.  She will be skilled, does have 3 day stay in place.  [name] will be contact for Saturday DC.    Expected Discharge Plan: [place] Barriers to Discharge: Continued Medical Work up  Expected Discharge Plan and Services In-house Referral: Clinical Social Work   Post Acute Care Choice: [place] Living arrangements for the past 2 months: [place] (LTC at [place])                                       Social Determinants of Health ([place]) Interventions SDOH Screenings   Tobacco Use: Medium Risk (01/27/2021)    Readmission Risk Interventions     No data to display

## 2022-02-17 NOTE — Progress Notes (Signed)
[facility] Progress Note Patient: Jamie George PVV:[id number] DOB: February 08, 1946 DOA: 02/14/2022  DOS: the patient was seen and examined on 02/17/2022  Brief hospital course:  Jamie George is a 76 y.o. female with PMH significant of dementia, 1 disorder, HTN, GERD, depression, HLD.  Currently being treated for influenza in the hospital.  Assessment and Plan: Acute hypoxic respiratory failure Influenza A infection. Lactic acidosis Patient presents with complaints of cough and shortness of breath.  84% on room air.  Initially on 6 L of oxygen but saturating 89%. Requirement has been improving, she is on 1 L nasal cannula this morning. Does not use oxygen at baseline. Influenza A is positive.  CT PE protocol negative for pulmonary embolism. No significant consolidation.  COVID-negative. Continue Tamiflu continue nebulizer therapy, Mucinex, Delsym, Tessalon Perles.  For now holding steroids.   Lactic acidosis  Mostly elevated due to increased work of breathing. Resolved with hydration   Hypertension On valsartan at home. Will hold for now.   Chronic kidney disease, stage 3b with mild renal insufficiency. Baseline serum creatinine around 1.3. On admission serum creatinine 1.57. Not meeting AKI criteria but worsened from baseline. Treated with gentle IV fluids.  Now serum creatinine at baseline.   Dementia ([place]) Mood disorder ([place]) Patient is on Aricept, Namenda, Risperdal, Remeron and Zonegran prior to admission. Will continue all for now. Zonegran has not been recommended to be used in CrCl less than 50.  May need to be adjusted by PCP down the road.   Hyperlipidemia Continue statin.   Lactic acidosis From increased work of breathing. Received IV fluid.  Monitor.   8 mm left pulmonary nodule. Noncontrast chest CT in 3 to 6 months is recommended for a follow-up. Although given her dementia need to discuss with family regarding the utility of the study before  proceeding.  Stage II right buttock pressure ulcer. Present on admission retention continue foam dressing.   Subjective: Does report some cough and congestion, no nausea, no vomiting.  Physical Exam:  Awake Alert, is not, she is oriented to time and person, she thinks she is in skilled nursing facility.,  Frail Symmetrical Chest wall movement, scattered wheezing. RRR,No Gallops,Rubs or new Murmurs, No Parasternal Heave +ve B.Sounds, Abd Soft, No tenderness, No rebound - guarding or rigidity. No Cyanosis, Clubbing or edema, No new Rash or bruise     Data Reviewed: I have Reviewed nursing notes, Vitals, and Lab results. I have ordered test including CBC and BMP.    Disposition: Status is: Inpatient Remains inpatient appropriate because: Still hypoxic requiring oxygen therapy  heparin injection 5,000 Units Start: 02/14/22 2200   Family Communication: No one at bedside Level of care: Med-Surg switch to [place]. Vitals:   02/17/22 0835 02/17/22 1334 02/17/22 1335 02/17/22 1423  BP:    (!) 143/65  Pulse:    85  Resp:    19  Temp:    98.2 F (36.8 C)  TempSrc:    Oral  SpO2: 96% (!) 86% 94% 96%  Height:         Author: Phillips Climes, MD 02/17/2022 3:43 PM  Please look on [email or web address] to find out who is on call.

## 2022-02-18 DIAGNOSIS — N1832 Chronic kidney disease, stage 3b: Secondary | ICD-10-CM | POA: Diagnosis not present

## 2022-02-18 DIAGNOSIS — R911 Solitary pulmonary nodule: Secondary | ICD-10-CM | POA: Diagnosis not present

## 2022-02-18 DIAGNOSIS — J9601 Acute respiratory failure with hypoxia: Secondary | ICD-10-CM | POA: Diagnosis not present

## 2022-02-18 DIAGNOSIS — F03A0 Unspecified dementia, mild, without behavioral disturbance, psychotic disturbance, mood disturbance, and anxiety: Secondary | ICD-10-CM | POA: Diagnosis not present

## 2022-02-18 MED ORDER — PREDNISONE 10 MG PO TABS: ORAL_TABLET | ORAL | 0 refills | Status: AC

## 2022-02-18 MED ORDER — VERAPAMIL HCL ER 120 MG PO TBCR: 120.0000 mg | EXTENDED_RELEASE_TABLET | Freq: Every day | ORAL | Status: AC

## 2022-02-18 MED ORDER — OSELTAMIVIR PHOSPHATE 30 MG PO CAPS: 30.0000 mg | ORAL_CAPSULE | Freq: Two times a day (BID) | ORAL | 0 refills | Status: AC

## 2022-02-18 NOTE — Plan of Care (Signed)

## 2022-02-18 NOTE — Progress Notes (Signed)
Patient can return to [place] today. Patient will go to room 401. The number to call for report is [phone]. RN to call [facility] and report when ready.  Madilyn Fireman, MSW, LCSW Transitions of Care  Clinical Social Worker II [phone]

## 2022-02-18 NOTE — Progress Notes (Signed)
Mobility Specialist Progress Note   02/18/22 1100  Mobility  Activity Ambulated with assistance in hallway  Level of Assistance Minimal assist, patient does 75% or more  Assistive Device Front wheel walker  Distance Ambulated (ft) 64 ft  Activity Response Tolerated well  $Mobility charge 1 Mobility   Pre Mobility: 92% SpO2 on RA During Mobility: 83% SpO2 on RA Post Mobility: 94% SpO2 on 1LO2  Received in bed c/o HA but agreeable. MinA to EOB d/t trunk weakness but MinG for remainder of session. Pt presenting w/ general weakness during ambulation w/ DOE, SpO2 desat to 83%. Placed on 1LO2 per advisement of RN and pt SpO2 remaining >93%. Left back in bed w/ call bell in reach, bed alarm on and on 1LO2.   [place]land Falling Mobility Specialist Please contact via SecureChat or  Rehab office at [phone]

## 2022-02-18 NOTE — Progress Notes (Signed)
Jamie George to be D/C'd Skilled nursing facility per MD order.  Discussed with the patient and all questions fully answered.  VSS, Skin clean, dry and intact without evidence of skin break down, no evidence of skin tears noted. IV catheter discontinued intact. Site without signs and symptoms of complications. Dressing and pressure applied.  An After Visit Summary was printed and given to [facility] for facility.  D/c education completed with patient/family including follow up instructions, medication list, d/c activities limitations if indicated, with other d/c instructions as indicated by MD - patient able to verbalize understanding, all questions fully answered.   Report given to [facility], nurse at [place].  Patient instructed to return to ED, call 911, or call MD for any changes in condition.   Patient escorted via stretcher, and D/C to SNF via non emergency ambulance.  [name] 02/18/2022 2:09 PM

## 2022-02-18 NOTE — Discharge Summary (Signed)
Physician Discharge Summary  Jamie George IRW:[id number] DOB: 1945/04/15 DOA: 02/14/2022  PCP: Kristen Loader, FNP  Admit date: 02/14/2022 Discharge date: 02/18/2022  Admitted From: ( SNF) Disposition:  (SNF)  Recommendations for Outpatient Follow-up:  Follow up with PCP in 1-2 weeks Please obtain BMP/CBC in one week Will need Noncontrast chest CT in 3 to 6 months is recommended for a follow-up for incidental finding of 8 mm pulmonary nodule.    Discharge Condition: (Stable) CODE STATUS: ( DNR) Diet recommendation: Dysphagia 3 with thin liquid  Brief/Interim Summary: Jamie George is a 76 y.o. female with PMH significant of dementia, 1 disorder, HTN, GERD, depression, HLD.  Currently being treated for influenza in the hospita   Acute hypoxic respiratory failure Influenza A infection. Lactic acidosis Patient presents with complaints of cough and shortness of breath.  84% on room air.  Initially on 6 L of oxygen, all has resolved, she is currently tolerating room air with no hypoxia Influenza A is positive.  CT PE protocol negative for pulmonary embolism. No significant consolidation.  COVID-negative. Continue with Tamiflu, she has 2 doses left Had some wheezing initially for which she started on steroid, this has improved, to continue steroid taper over the next 3 days.     Lactic acidosis  Mostly elevated due to increased work of breathing. Resolved with hydration   Hypertension Home medications on discharge   Chronic kidney disease, stage 3b with mild renal insufficiency. -Renal function stable   Dementia ([place]) Mood disorder ([place]) Patient is on Aricept, Namenda, Risperdal, Remeron and Zonegran prior to admission. Will continue discharge   Hyperlipidemia Continue statin.   Lactic acidosis From increased work of breathing. resolved   8 mm left pulmonary nodule. Noncontrast chest CT in 3 to 6 months is recommended for a follow-up.  Stage II right  buttock pressure ulcer. Present on admission retention continue foam dressing.  Discharge Diagnoses:  Principal Problem:   Acute hypoxic respiratory failure (HCC) Active Problems:   Hypertension   Chronic kidney disease, stage 3b (HCC)   Dementia (HCC)   Mood disorder (HCC)   Hyperlipidemia   Lactic acidosis   8 mm left pulmonary nodule    Discharge Instructions  Discharge Instructions     Diet - low sodium heart healthy   Complete by: As directed    Discharge instructions   Complete by: As directed    Follow with Primary MD Kristen Loader, FNP /SNF PHYSICIAN   Get CBC, CMP,  checked  by Primary MD next visit.    Activity: As tolerated with Full fall precautions use walker/cane & assistance as needed   Disposition SNF   Diet: Dysphagia 3 with thin liquid  On your next visit with your primary care physician please Get Medicines reviewed and adjusted.   Please request your Prim.MD to go over all Hospital Tests and Procedure/Radiological results at the follow up, please get all Hospital records sent to your Prim MD by signing hospital release before you go home.   If you experience worsening of your admission symptoms, develop shortness of breath, life threatening emergency, suicidal or homicidal thoughts you must seek medical attention immediately by calling 911 or calling your MD immediately  if symptoms less severe.  You Must read complete instructions/literature along with all the possible adverse reactions/side effects for all the Medicines you take and that have been prescribed to you. Take any new Medicines after you have completely understood and accpet all the possible adverse reactions/side  effects.   Do not drive, operating heavy machinery, perform activities at heights, swimming or participation in water activities or provide baby sitting services if your were admitted for syncope or siezures until you have seen by Primary MD or a Neurologist and advised to  do so again.  Do not drive when taking Pain medications.    Do not take more than prescribed Pain, Sleep and Anxiety Medications  Special Instructions: If you have smoked or chewed Tobacco  in the last 2 yrs please stop smoking, stop any regular Alcohol  and or any Recreational drug use.  Wear Seat belts while driving.   Please note  You were cared for by a hospitalist during your hospital stay. If you have any questions about your discharge medications or the care you received while you were in the hospital after you are discharged, you can call the unit and asked to speak with the hospitalist on call if the hospitalist that took care of you is not available. Once you are discharged, your primary care physician will handle any further medical issues. Please note that NO REFILLS for any discharge medications will be authorized once you are discharged, as it is imperative that you return to your primary care physician (or establish a relationship with a primary care physician if you do not have one) for your aftercare needs so that they can reassess your need for medications and monitor your lab values.   Increase activity slowly   Complete by: As directed    No wound care   Complete by: As directed       Allergies as of 02/18/2022   No Known Allergies      Medication List     STOP taking these medications    traMADol 50 MG tablet Commonly known as: ULTRAM       TAKE these medications    acetaminophen 325 MG tablet Commonly known as: TYLENOL Take 650 mg by mouth every 8 (eight) hours as needed for mild pain. What changed: Another medication with the same name was removed. Continue taking this medication, and follow the directions you see here.   albuterol 108 (90 Base) MCG/ACT inhaler Commonly known as: VENTOLIN HFA Inhale 1 puff into the lungs 2 (two) times daily. (0800, 2000)   aspirin EC 81 MG tablet Take 1 tablet (81 mg total) by mouth daily. With food What  changed: additional instructions   atorvastatin 40 MG tablet Commonly known as: LIPITOR Take 40 mg by mouth See admin instructions. 40 mg every morning (0800) every Thursday, Sunday.   Biofreeze 4 % Gel Generic drug: Menthol (Topical Analgesic) Apply 1 application  topically in the morning. (0800) to legs/thighs (mix with a mild moisturizer)   calcium carbonate 500 MG chewable tablet Commonly known as: TUMS - dosed in mg elemental calcium Chew 500 mg by mouth 2 (two) times daily. (0800, 2000)   docusate sodium 100 MG capsule Commonly known as: COLACE Take 100 mg by mouth in the morning. (0800)   donepezil 10 MG tablet Commonly known as: ARICEPT Take 10 mg by mouth at bedtime. (2000)   escitalopram 20 MG tablet Commonly known as: LEXAPRO Take 20 mg by mouth in the morning. (0800)   fluticasone 50 MCG/ACT nasal spray Commonly known as: FLONASE Place 1 spray into both nostrils in the morning. (0800)   Melatonin 10 MG Tabs Take 10 mg by mouth at bedtime. (2000)   memantine 10 MG tablet Commonly known as: NAMENDA Take 1  tablet (10 mg total) by mouth 2 (two) times daily. What changed: additional instructions   mirtazapine 7.5 MG tablet Commonly known as: REMERON Take 7.5 mg by mouth at bedtime. (2000)   oseltamivir 30 MG capsule Commonly known as: TAMIFLU Take 1 capsule (30 mg total) by mouth 2 (two) times daily for 1 day.   OXYGEN Inhale 2 L into the lungs as needed (sats < 90).   polyethylene glycol 17 g packet Commonly known as: MIRALAX / GLYCOLAX Take 17 g by mouth in the morning. (0800)   predniSONE 10 MG tablet Commonly known as: DELTASONE Please take 30 mg oral daily x 1 day, then 20 mg oral daily x 1 day, then 10 mg oral daily x 1 day then stop.(3 days taper)   Resource 2.0 Liqd Take 120 mLs by mouth 2 (two) times daily. (1000, 1400)   risperiDONE 1 MG tablet Commonly known as: RISPERDAL Take 1 mg by mouth at bedtime. (2100)   traZODone 50 MG  tablet Commonly known as: DESYREL Take 50 mg by mouth at bedtime.   verapamil 120 MG CR tablet Commonly known as: CALAN-SR Take 1 tablet (120 mg total) by mouth at bedtime. What changed:  medication strength how much to take when to take this additional instructions   zonisamide 100 MG capsule Commonly known as: ZONEGRAN Take 200 mg by mouth at bedtime. (2000)        Contact information for after-discharge care     Destination     [facility] .   Service: Skilled Nursing Contact information: [address] [place] [phone]                    No Known Allergies  Consultations: none   Procedures/Studies: CT Angio Chest PE W and/or Wo Contrast  Result Date: 02/14/2022 CLINICAL DATA:  Cough, short of breath for 3 weeks, hypoxia EXAM: CT ANGIOGRAPHY CHEST WITH CONTRAST TECHNIQUE: Multidetector CT imaging of the chest was performed using the standard protocol during bolus administration of intravenous contrast. Multiplanar CT image reconstructions and MIPs were obtained to evaluate the vascular anatomy. RADIATION DOSE REDUCTION: This exam was performed according to the departmental dose-optimization program which includes automated exposure control, adjustment of the mA and/or kV according to patient size and/or use of iterative reconstruction technique. CONTRAST:  46m OMNIPAQUE IOHEXOL 350 MG/ML SOLN COMPARISON:  02/14/2022, 01/27/2021 FINDINGS: Cardiovascular: This is a technically adequate evaluation of the pulmonary vasculature. There are no filling defects or pulmonary emboli. The heart is unremarkable without pericardial effusion. No evidence of thoracic aortic aneurysm or dissection. Atherosclerosis of the aorta and coronary vasculature. Mediastinum/Nodes: No enlarged mediastinal, hilar, or axillary lymph nodes. Thyroid gland, trachea, and esophagus demonstrate no significant findings. Lungs/Pleura: Dependent  hypoventilatory changes are seen bilaterally. There is an 8 mm subpleural left lower lobe nodule reference image 72/6, new since prior exam. 6 mm lingular nodule image 79/6 is also new since previous study. Tree in bud nodular opacities are seen within the right upper lobe, reference image 40 3-45 of series 6, measuring up to 4 mm in size. No other airspace disease, effusion, or pneumothorax. The central airways are patent. Upper Abdomen: No acute abnormality. Musculoskeletal: No acute or destructive bony lesions. Reconstructed images demonstrate no additional findings. Review of the MIP images confirms the above findings. IMPRESSION: 1. No evidence of pulmonary embolus. 2. Multiple pulmonary nodules, favor inflammatory or infectious etiology. Most significant: 8 mm left solid pulmonary nodule. Per Fleischner  Society Guidelines, recommend a non-contrast Chest CT at 3-6 months, then consider another non-contrast Chest CT at 18-24 months. If patient is low risk for malignancy, non-contrast Chest CT at 18-24 months is optional. These guidelines do not apply to immunocompromised patients and patients with cancer. Follow up in patients with significant comorbidities as clinically warranted. For lung cancer screening, adhere to Lung-RADS guidelines. Reference: Radiology. 2017; 284(1):228-43. 3. Aortic Atherosclerosis (ICD10-I70.0). Coronary artery atherosclerosis. Electronically Signed   By: Randa Ngo M.D.   On: 02/14/2022 16:20   DG Chest 1 View  Result Date: 02/14/2022 CLINICAL DATA:  Cough and shortness of breath for 3 weeks EXAM: CHEST  1 VIEW COMPARISON:  01/27/2021 FINDINGS: Upper normal size of cardiac silhouette. Mediastinal contours and pulmonary vascularity normal. Atherosclerotic calcification aorta. Elevation of RIGHT diaphragm with bibasilar atelectasis. Upper lungs clear. No definite acute infiltrate, pleural effusion, or pneumothorax. Osseous structures unremarkable. IMPRESSION: Bibasilar  atelectasis. Insert choose on Electronically Signed   By: Lavonia Dana M.D.   On: 02/14/2022 13:43      Subjective:  No significant events overnight as discussed with staff, she denies any complaints today.  Discharge Exam: Vitals:   02/18/22 0830 02/18/22 0926  BP: 138/76   Pulse: 71   Resp: 17   Temp: 98.3 F (36.8 C)   SpO2: 93% 93%   Vitals:   02/17/22 1950 02/17/22 2100 02/18/22 0830 02/18/22 0926  BP:  131/80 138/76   Pulse:  82 71   Resp:  20 17   Temp:  97.9 F (36.6 C) 98.3 F (36.8 C)   TempSrc:  Oral Oral   SpO2: 97% 95% 93% 93%  Height:        General: Pt is alert, awake, plesently demented, in no apparent distress Cardiovascular: RRR, S1/S2 +, no rubs, no gallops Respiratory: CTA bilaterally, no wheezing, no rhonchi Abdominal: Soft, NT, ND, bowel sounds + Extremities: no edema, no cyanosis    The results of significant diagnostics from this hospitalization (including imaging, microbiology, ancillary and laboratory) are listed below for reference.     Microbiology: Recent Results (from the past 240 hour(s))  Resp panel by RT-PCR (RSV, Flu A&B, Covid) Anterior Nasal Swab     Status: Abnormal   Collection Time: 02/14/22 12:52 PM   Specimen: Anterior Nasal Swab  Result Value Ref Range Status   SARS Coronavirus 2 by RT PCR NEGATIVE NEGATIVE Final    Comment: (NOTE) SARS-CoV-2 target nucleic acids are NOT DETECTED.  The SARS-CoV-2 RNA is generally detectable in upper respiratory specimens during the acute phase of infection. The lowest concentration of SARS-CoV-2 viral copies this assay can detect is 138 copies/mL. A negative result does not preclude SARS-Cov-2 infection and should not be used as the sole basis for treatment or other patient management decisions. A negative result may occur with  improper specimen collection/handling, submission of specimen other than nasopharyngeal swab, presence of viral mutation(s) within the areas targeted by this  assay, and inadequate number of viral copies(<138 copies/mL). A negative result must be combined with clinical observations, patient history, and epidemiological information. The expected result is Negative.  Fact Sheet for Patients:  EntrepreneurPulse.com.au  Fact Sheet for Healthcare Providers:  IncredibleEmployment.be  This test is no t yet approved or cleared by the [place] FDA and  has been authorized for detection and/or diagnosis of SARS-CoV-2 by FDA under an Emergency Use Authorization (EUA). This EUA will remain  in effect (meaning this test can be used) for the duration  of the COVID-19 declaration under Section 564(b)(1) of the Act, 21 U.S.C.section 360bbb-3(b)(1), unless the authorization is terminated  or revoked sooner.       Influenza A by PCR POSITIVE (A) NEGATIVE Final   Influenza B by PCR NEGATIVE NEGATIVE Final    Comment: (NOTE) The Xpert Xpress SARS-CoV-2/FLU/RSV plus assay is intended as an aid in the diagnosis of influenza from Nasopharyngeal swab specimens and should not be used as a sole basis for treatment. Nasal washings and aspirates are unacceptable for Xpert Xpress SARS-CoV-2/FLU/RSV testing.  Fact Sheet for Patients: EntrepreneurPulse.com.au  Fact Sheet for Healthcare Providers: IncredibleEmployment.be  This test is not yet approved or cleared by the [place] FDA and has been authorized for detection and/or diagnosis of SARS-CoV-2 by FDA under an Emergency Use Authorization (EUA). This EUA will remain in effect (meaning this test can be used) for the duration of the COVID-19 declaration under Section 564(b)(1) of the Act, 21 U.S.C. section 360bbb-3(b)(1), unless the authorization is terminated or revoked.     Resp Syncytial Virus by PCR NEGATIVE NEGATIVE Final    Comment: (NOTE) Fact Sheet for Patients: EntrepreneurPulse.com.au  Fact Sheet  for Healthcare Providers: IncredibleEmployment.be  This test is not yet approved or cleared by the [place] FDA and has been authorized for detection and/or diagnosis of SARS-CoV-2 by FDA under an Emergency Use Authorization (EUA). This EUA will remain in effect (meaning this test can be used) for the duration of the COVID-19 declaration under Section 564(b)(1) of the Act, 21 U.S.C. section 360bbb-3(b)(1), unless the authorization is terminated or revoked.  Performed at [facility], [place] [address] [place], [place] [address]      Labs: BNP (last 3 results) No results for input(s): "BNP" in the last 8760 hours. Basic Metabolic Panel: Recent Labs  Lab 02/14/22 1300 02/14/22 1408 02/15/22 0345 02/17/22 0321  NA 141 141 138 139  K 3.8 3.9 4.0 4.4  CL 111  --  108 107  CO2 21*  --  25 23  GLUCOSE 176*  --  105* 78  BUN 19  --  15 26*  CREATININE 1.57*  --  1.25* 0.91  CALCIUM 8.4*  --  8.2* 8.6*  MG  --   --   --  2.1   Liver Function Tests: Recent Labs  Lab 02/14/22 1300 02/15/22 0345  AST 25 26  ALT 21 22  ALKPHOS 51 41  BILITOT 0.8 0.4  PROT 5.9* 5.4*  ALBUMIN 3.0* 2.7*   No results for input(s): "LIPASE", "AMYLASE" in the last 168 hours. No results for input(s): "AMMONIA" in the last 168 hours. CBC: Recent Labs  Lab 02/14/22 1300 02/14/22 1408 02/15/22 0345 02/17/22 0321  WBC 10.5  --  12.4* 9.6  NEUTROABS 9.3*  --   --   --   HGB 14.9 15.3* 14.0 14.0  HCT 46.7* 45.0 43.1 45.0  MCV 96.5  --  95.1 96.2  PLT 239  --  218 210   Cardiac Enzymes: No results for input(s): "CKTOTAL", "CKMB", "CKMBINDEX", "TROPONINI" in the last 168 hours. BNP: Invalid input(s): "POCBNP" CBG: No results for input(s): "GLUCAP" in the last 168 hours. D-Dimer No results for input(s): "DDIMER" in the last 72 hours. Hgb A1c No results for input(s): "HGBA1C" in the last 72 hours. Lipid Profile No results for input(s): "CHOL", "HDL", "LDLCALC",  "TRIG", "CHOLHDL", "LDLDIRECT" in the last 72 hours. Thyroid function studies No results for input(s): "TSH", "T4TOTAL", "T3FREE", "THYROIDAB" in the last  72 hours.  Invalid input(s): "FREET3" Anemia work up No results for input(s): "VITAMINB12", "FOLATE", "FERRITIN", "TIBC", "IRON", "RETICCTPCT" in the last 72 hours. Urinalysis    Component Value Date/Time   COLORURINE STRAW (A) 06/19/2020 1851   APPEARANCEUR CLEAR 06/19/2020 1851   LABSPEC 1.006 06/19/2020 1851   PHURINE 9.0 (H) 06/19/2020 1851   GLUCOSEU NEGATIVE 06/19/2020 1851   HGBUR MODERATE (A) 06/19/2020 1851   BILIRUBINUR NEGATIVE 06/19/2020 1851   KETONESUR NEGATIVE 06/19/2020 1851   PROTEINUR NEGATIVE 06/19/2020 1851   NITRITE NEGATIVE 06/19/2020 1851   LEUKOCYTESUR MODERATE (A) 06/19/2020 1851   Sepsis Labs Recent Labs  Lab 02/14/22 1300 02/15/22 0345 02/17/22 0321  WBC 10.5 12.4* 9.6   Microbiology Recent Results (from the past 240 hour(s))  Resp panel by RT-PCR (RSV, Flu A&B, Covid) Anterior Nasal Swab     Status: Abnormal   Collection Time: 02/14/22 12:52 PM   Specimen: Anterior Nasal Swab  Result Value Ref Range Status   SARS Coronavirus 2 by RT PCR NEGATIVE NEGATIVE Final    Comment: (NOTE) SARS-CoV-2 target nucleic acids are NOT DETECTED.  The SARS-CoV-2 RNA is generally detectable in upper respiratory specimens during the acute phase of infection. The lowest concentration of SARS-CoV-2 viral copies this assay can detect is 138 copies/mL. A negative result does not preclude SARS-Cov-2 infection and should not be used as the sole basis for treatment or other patient management decisions. A negative result may occur with  improper specimen collection/handling, submission of specimen other than nasopharyngeal swab, presence of viral mutation(s) within the areas targeted by this assay, and inadequate number of viral copies(<138 copies/mL). A negative result must be combined with clinical observations,  patient history, and epidemiological information. The expected result is Negative.  Fact Sheet for Patients:  EntrepreneurPulse.com.au  Fact Sheet for Healthcare Providers:  IncredibleEmployment.be  This test is no t yet approved or cleared by the [place] FDA and  has been authorized for detection and/or diagnosis of SARS-CoV-2 by FDA under an Emergency Use Authorization (EUA). This EUA will remain  in effect (meaning this test can be used) for the duration of the COVID-19 declaration under Section 564(b)(1) of the Act, 21 U.S.C.section 360bbb-3(b)(1), unless the authorization is terminated  or revoked sooner.       Influenza A by PCR POSITIVE (A) NEGATIVE Final   Influenza B by PCR NEGATIVE NEGATIVE Final    Comment: (NOTE) The Xpert Xpress SARS-CoV-2/FLU/RSV plus assay is intended as an aid in the diagnosis of influenza from Nasopharyngeal swab specimens and should not be used as a sole basis for treatment. Nasal washings and aspirates are unacceptable for Xpert Xpress SARS-CoV-2/FLU/RSV testing.  Fact Sheet for Patients: EntrepreneurPulse.com.au  Fact Sheet for Healthcare Providers: IncredibleEmployment.be  This test is not yet approved or cleared by the [place] FDA and has been authorized for detection and/or diagnosis of SARS-CoV-2 by FDA under an Emergency Use Authorization (EUA). This EUA will remain in effect (meaning this test can be used) for the duration of the COVID-19 declaration under Section 564(b)(1) of the Act, 21 U.S.C. section 360bbb-3(b)(1), unless the authorization is terminated or revoked.     Resp Syncytial Virus by PCR NEGATIVE NEGATIVE Final    Comment: (NOTE) Fact Sheet for Patients: EntrepreneurPulse.com.au  Fact Sheet for Healthcare Providers: IncredibleEmployment.be  This test is not yet approved or cleared by the [place] FDA and has been authorized for detection and/or diagnosis of SARS-CoV-2 by FDA under an Emergency Use Authorization (EUA).  This EUA will remain in effect (meaning this test can be used) for the duration of the COVID-19 declaration under Section 564(b)(1) of the Act, 21 U.S.C. section 360bbb-3(b)(1), unless the authorization is terminated or revoked.  Performed at [facility], [place] [address], [place], [place] [address]      Time coordinating discharge: Over 30 minutes  SIGNED:   Phillips Climes, MD  [facility] 02/18/2022, 11:56 AM Pager   If 7PM-7AM, please contact night-coverage [email or web address]

## 2022-02-18 NOTE — Discharge Instructions (Signed)
Follow with Primary MD Kristen Loader, FNP /SNF PHYSICIAN   Get CBC, CMP,  checked  by Primary MD next visit.    Activity: As tolerated with Full fall precautions use walker/cane & assistance as needed   Disposition SNF   Diet: Dysphagia 3 with thin liquid  On your next visit with your primary care physician please Get Medicines reviewed and adjusted.   Please request your Prim.MD to go over all Hospital Tests and Procedure/Radiological results at the follow up, please get all Hospital records sent to your Prim MD by signing hospital release before you go home.   If you experience worsening of your admission symptoms, develop shortness of breath, life threatening emergency, suicidal or homicidal thoughts you must seek medical attention immediately by calling 911 or calling your MD immediately  if symptoms less severe.  You Must read complete instructions/literature along with all the possible adverse reactions/side effects for all the Medicines you take and that have been prescribed to you. Take any new Medicines after you have completely understood and accpet all the possible adverse reactions/side effects.   Do not drive, operating heavy machinery, perform activities at heights, swimming or participation in water activities or provide baby sitting services if your were admitted for syncope or siezures until you have seen by Primary MD or a Neurologist and advised to do so again.  Do not drive when taking Pain medications.    Do not take more than prescribed Pain, Sleep and Anxiety Medications  Special Instructions: If you have smoked or chewed Tobacco  in the last 2 yrs please stop smoking, stop any regular Alcohol  and or any Recreational drug use.  Wear Seat belts while driving.   Please note  You were cared for by a hospitalist during your hospital stay. If you have any questions about your discharge medications or the care you received while you were in the hospital after  you are discharged, you can call the unit and asked to speak with the hospitalist on call if the hospitalist that took care of you is not available. Once you are discharged, your primary care physician will handle any further medical issues. Please note that NO REFILLS for any discharge medications will be authorized once you are discharged, as it is imperative that you return to your primary care physician (or establish a relationship with a primary care physician if you do not have one) for your aftercare needs so that they can reassess your need for medications and monitor your lab values.

## 2022-07-06 ENCOUNTER — Other Ambulatory Visit: Payer: Self-pay | Admitting: Nurse Practitioner

## 2022-07-06 DIAGNOSIS — R519 Headache, unspecified: Secondary | ICD-10-CM

## 2022-07-06 DIAGNOSIS — R911 Solitary pulmonary nodule: Secondary | ICD-10-CM

## 2022-08-07 ENCOUNTER — Ambulatory Visit
Admission: RE | Admit: 2022-08-07 | Discharge: 2022-08-07 | Disposition: A | Discharge status: Home / Self Care | Payer: Medicare Other | Source: Ambulatory Visit | Attending: Nurse Practitioner | Admitting: Nurse Practitioner

## 2022-08-07 DIAGNOSIS — R519 Headache, unspecified: Secondary | ICD-10-CM

## 2022-08-07 DIAGNOSIS — R911 Solitary pulmonary nodule: Secondary | ICD-10-CM

## 2023-03-01 ENCOUNTER — Encounter (HOSPITAL_COMMUNITY): Payer: Self-pay

## 2023-03-01 ENCOUNTER — Other Ambulatory Visit: Payer: Self-pay

## 2023-03-01 ENCOUNTER — Emergency Department (HOSPITAL_COMMUNITY): Payer: Medicare Other

## 2023-03-01 ENCOUNTER — Emergency Department (HOSPITAL_COMMUNITY)
Admission: EM | Admit: 2023-03-01 | Discharge: 2023-03-02 | Disposition: A | Discharge status: Home / Self Care | Payer: Medicare Other | Attending: Emergency Medicine | Admitting: Emergency Medicine

## 2023-03-01 DIAGNOSIS — R079 Chest pain, unspecified: Secondary | ICD-10-CM | POA: Diagnosis present

## 2023-03-01 DIAGNOSIS — J989 Respiratory disorder, unspecified: Secondary | ICD-10-CM

## 2023-03-01 DIAGNOSIS — R918 Other nonspecific abnormal finding of lung field: Secondary | ICD-10-CM | POA: Diagnosis not present

## 2023-03-01 LAB — CBC
HCT: 46.4 % — ABNORMAL HIGH (ref 36.0–46.0)
Hemoglobin: 15 g/dL (ref 12.0–15.0)
MCH: 29.9 pg (ref 26.0–34.0)
MCHC: 32.3 g/dL (ref 30.0–36.0)
MCV: 92.4 fL (ref 80.0–100.0)
Platelets: 248 10*3/uL (ref 150–400)
RBC: 5.02 MIL/uL (ref 3.87–5.11)
RDW: 15.5 % (ref 11.5–15.5)
WBC: 9.1 10*3/uL (ref 4.0–10.5)
nRBC: 0 % (ref 0.0–0.2)

## 2023-03-01 LAB — BASIC METABOLIC PANEL
Anion gap: 7 (ref 5–15)
BUN: 32 mg/dL — ABNORMAL HIGH (ref 8–23)
CO2: 23 mmol/L (ref 22–32)
Calcium: 8.9 mg/dL (ref 8.9–10.3)
Chloride: 108 mmol/L (ref 98–111)
Creatinine, Ser: 1.02 mg/dL — ABNORMAL HIGH (ref 0.44–1.00)
GFR, Estimated: 56 mL/min — ABNORMAL LOW (ref >60)
Glucose, Bld: 90 mg/dL (ref 70–99)
Potassium: 3.5 mmol/L (ref 3.5–5.1)
Sodium: 138 mmol/L (ref 135–145)

## 2023-03-01 LAB — TROPONIN I (HIGH SENSITIVITY): Troponin I (High Sensitivity): 5 ng/L (ref <18)

## 2023-03-01 MED ORDER — MORPHINE SULFATE (PF) 4 MG/ML IV SOLN
4.0000 mg | Freq: Once | INTRAVENOUS | Status: AC
  Administered 2023-03-02: 4 mg via INTRAVENOUS
  Filled 2023-03-01: qty 1

## 2023-03-01 MED ORDER — IOHEXOL 350 MG/ML SOLN
100.0000 mL | Freq: Once | INTRAVENOUS | Status: AC | PRN
  Administered 2023-03-02: 100 mL via INTRAVENOUS

## 2023-03-01 MED ORDER — ONDANSETRON HCL 4 MG/2ML IJ SOLN
4.0000 mg | Freq: Once | INTRAMUSCULAR | Status: AC
  Administered 2023-03-02: 4 mg via INTRAVENOUS
  Filled 2023-03-01: qty 2

## 2023-03-01 NOTE — ED Notes (Signed)
 Called pt's son and daughter upon request, no answer, voicemail left.

## 2023-03-01 NOTE — ED Triage Notes (Addendum)
 Pt BIB EMS from [facility] for c/o chest pain. Nursing staff stated she's been c/o this for three days and another staff member stated since 2000 today. Per EMS "pt is confused at baseline".  18g L Forearm

## 2023-03-01 NOTE — ED Provider Notes (Signed)
 [facility]ENC[facility]PT [facility] Emergency Department Provider Note MRN:  [id number]  Arrival date & time: 03/02/23     Chief Complaint   Chest Pain   History of Present Illness   Jamie George is a 78 y.o. year-old female presents to the ED with chief complaint of chest  pain.  She states that the pain started 3 days ago.  She has some associated SOB.  She denies any injury.  Denies cough, fever, chills, nausea, or vomiting.    From [facility]    Hx of abdominal aortic dissection.     Review of Systems  Pertinent positive and negative review of systems noted in HPI.    Physical Exam   Vitals:   03/02/23 0036 03/02/23 0038  BP: (!) 156/78   Pulse: 64 64  Resp: 15 10  Temp:    SpO2: 98% 98%    CONSTITUTIONAL:  non toxic-appearing, NAD NEURO:  Alert and oriented x 3, CN 3-12 grossly intact EYES:  eyes equal and reactive ENT/NECK:  Supple, no stridor  CARDIO:  normal rate, regular rhythm, appears well-perfused, there is some anterior chest wall tenderness PULM:  No respiratory distress, CTAB GI/GU:  non-distended, non focal tenderness MSK/SPINE:  No gross deformities, no edema, moves all extremities  SKIN:  no rash, atraumatic   *Additional and/or pertinent findings included in MDM below  Diagnostic and Interventional Summary    EKG Interpretation Date/Time:  Thursday March 01 2023 22:01:21 EST Ventricular Rate:  62 PR Interval:  158 QRS Duration:  90 QT Interval:  421 QTC Calculation: 428 R Axis:   79  Text Interpretation: Sinus rhythm Low voltage, precordial leads Borderline T abnormalities, anterior leads Interpretation limited secondary to artifact Confirmed by Midge Golas ([id number]) on 03/01/2023 11:45:30 PM       Labs Reviewed  BASIC METABOLIC PANEL - Abnormal; Notable for the following components:      Result Value   BUN 32 (*)    Creatinine, Ser 1.02 (*)    GFR, Estimated 56 (*)    All other components within normal  limits  CBC - Abnormal; Notable for the following components:   HCT 46.4 (*)    All other components within normal limits  TROPONIN I (HIGH SENSITIVITY)  TROPONIN I (HIGH SENSITIVITY)    CT Angio Chest/Abd/Pel for Dissection W and/or Wo Contrast  Final Result    DG Chest 2 View  Final Result      Medications  morphine  (PF) 4 MG/ML injection 4 mg (4 mg Intravenous Given 03/02/23 0034)  ondansetron  (ZOFRAN ) injection 4 mg (4 mg Intravenous Given 03/02/23 0032)  iohexol  (OMNIPAQUE ) 350 MG/ML injection 100 mL (100 mLs Intravenous Contrast Given 03/02/23 0002)     Procedures  /  Critical Care Procedures  ED Course and Medical Decision Making  I have reviewed the triage vital signs, the nursing notes, and pertinent available records from the EMR.  Social Determinants Affecting Complexity of Care: Patient has problems living alone.   ED Course:    Medical Decision Making Patient here with chest pain that has been ongoing for about the past 3 days.  She is from a nursing home.  She denies cough or fever.  She states that the pain is reproducible with palpation.  She denies having shortness of breath.    She does have history of abdominal aortic dissection.  Given that she is hypertensive and history of dissection, will check CT dissection study.  CT scan resulted  and shows no evidence of acute aortic syndrome.  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  There are also a few nodules.  She will need to follow-up with pulmonology.  I will start her on some antibiotics in case this is acute, although its likely chronic.  Troponins are negative.  No acute ischemic EKG changes.  I doubt ACS.  Given reassuring workup, I do not feel the patient requires any further emergent intervention, admission, or consultation.  Feel the patient can be safely discharged home.  Amount and/or Complexity of Data Reviewed Labs:  ordered. Radiology: ordered.  Risk Prescription drug management.         Consultants: No consultations were needed in caring for this patient.   Treatment and Plan: I considered admission due to patient's initial presentation, but after considering the examination and diagnostic results, patient will not require admission and can be discharged with outpatient follow-up.    Final Clinical Impressions(s) / ED Diagnoses     ICD-10-CM   1. Chest pain, unspecified type  R07.9     2. Pulmonary nodules  R91.8     3. Chronic airway disease  J98.9       ED Discharge Orders          Ordered    azithromycin  (ZITHROMAX ) 250 MG tablet  Daily        03/02/23 0201              Discharge Instructions Discussed with and Provided to Patient:     Discharge Instructions      You need to follow-up with your primary care doctor.  You may benefit from seeing a pulmonologist due to the chronic appearance of some of the inflammatory changes seen on your CT scan.  You also have pulmonary nodules that we will need to be followed by your doctor.       Vicky Charleston, PA-C 03/02/23 FABIAN    Midge Golas, MD 03/02/23 [phone]

## 2023-03-01 NOTE — ED Notes (Signed)
 Patient transported to CT

## 2023-03-02 ENCOUNTER — Emergency Department (HOSPITAL_COMMUNITY): Payer: Medicare Other

## 2023-03-02 DIAGNOSIS — J989 Respiratory disorder, unspecified: Secondary | ICD-10-CM | POA: Diagnosis not present

## 2023-03-02 LAB — TROPONIN I (HIGH SENSITIVITY): Troponin I (High Sensitivity): 4 ng/L (ref <18)

## 2023-03-02 MED ORDER — AZITHROMYCIN 250 MG PO TABS
500.0000 mg | ORAL_TABLET | Freq: Once | ORAL | Status: AC
  Administered 2023-03-02: 500 mg via ORAL
  Filled 2023-03-02: qty 2

## 2023-03-02 MED ORDER — AZITHROMYCIN 250 MG PO TABS: 250.0000 mg | ORAL_TABLET | Freq: Every day | ORAL | 0 refills | Status: AC

## 2023-03-02 NOTE — ED Notes (Signed)
 [facility] called

## 2023-03-02 NOTE — Discharge Instructions (Signed)
 You need to follow-up with your primary care doctor.  You may benefit from seeing a pulmonologist due to the chronic appearance of some of the inflammatory changes seen on your CT scan.  You also have pulmonary nodules that we will need to be followed by your doctor.
